# Patient Record
Sex: Male | Born: 1965 | Race: White | Hispanic: No | Marital: Single | Smoking: Current some day smoker
Health system: Southern US, Community
[De-identification: ages and names within clinical notes are randomized; demographics above are authoritative.]

## PROBLEM LIST (undated history)

## (undated) ENCOUNTER — Ambulatory Visit (INDEPENDENT_AMBULATORY_CARE_PROVIDER_SITE_OTHER): Admission: RE | Payer: Self-pay

## (undated) DIAGNOSIS — F329 Major depressive disorder, single episode, unspecified: Secondary | ICD-10-CM

## (undated) DIAGNOSIS — I2699 Other pulmonary embolism without acute cor pulmonale: Secondary | ICD-10-CM

## (undated) DIAGNOSIS — F32A Depression, unspecified: Secondary | ICD-10-CM

## (undated) DIAGNOSIS — K602 Anal fissure, unspecified: Secondary | ICD-10-CM

## (undated) DIAGNOSIS — F419 Anxiety disorder, unspecified: Secondary | ICD-10-CM

## (undated) DIAGNOSIS — E042 Nontoxic multinodular goiter: Secondary | ICD-10-CM

## (undated) DIAGNOSIS — K5190 Ulcerative colitis, unspecified, without complications: Secondary | ICD-10-CM

## (undated) DIAGNOSIS — I10 Essential (primary) hypertension: Secondary | ICD-10-CM

## (undated) DIAGNOSIS — I82409 Acute embolism and thrombosis of unspecified deep veins of unspecified lower extremity: Secondary | ICD-10-CM

## (undated) DIAGNOSIS — G4730 Sleep apnea, unspecified: Secondary | ICD-10-CM

## (undated) DIAGNOSIS — D126 Benign neoplasm of colon, unspecified: Secondary | ICD-10-CM

## (undated) DIAGNOSIS — E291 Testicular hypofunction: Secondary | ICD-10-CM

## (undated) DIAGNOSIS — K219 Gastro-esophageal reflux disease without esophagitis: Secondary | ICD-10-CM

## (undated) DIAGNOSIS — G589 Mononeuropathy, unspecified: Secondary | ICD-10-CM

## (undated) HISTORY — DX: Nontoxic multinodular goiter: E04.2

## (undated) HISTORY — DX: Benign neoplasm of colon, unspecified: D12.6

## (undated) HISTORY — DX: Testicular hypofunction: E29.1

## (undated) HISTORY — DX: Anxiety disorder, unspecified: F41.9

## (undated) HISTORY — DX: Anal fissure, unspecified: K60.2

## (undated) HISTORY — DX: Sleep apnea, unspecified: G47.30

## (undated) HISTORY — DX: Major depressive disorder, single episode, unspecified: F32.9

## (undated) HISTORY — DX: Essential (primary) hypertension: I10

## (undated) HISTORY — DX: Ulcerative colitis, unspecified, without complications: K51.90

## (undated) HISTORY — DX: Depression, unspecified: F32.A

## (undated) HISTORY — DX: Mononeuropathy, unspecified: G58.9

---

## 1992-03-04 DIAGNOSIS — K5190 Ulcerative colitis, unspecified, without complications: Secondary | ICD-10-CM | POA: Insufficient documentation

## 1998-07-07 ENCOUNTER — Emergency Department (HOSPITAL_COMMUNITY): Admission: EM | Admit: 1998-07-07 | Discharge: 1998-07-07 | Payer: Self-pay | Admitting: Emergency Medicine

## 2005-01-09 ENCOUNTER — Ambulatory Visit: Payer: Self-pay | Admitting: Internal Medicine

## 2005-01-15 ENCOUNTER — Ambulatory Visit: Payer: Self-pay | Admitting: Internal Medicine

## 2005-01-29 ENCOUNTER — Ambulatory Visit: Payer: Self-pay | Admitting: Internal Medicine

## 2005-03-26 ENCOUNTER — Ambulatory Visit: Payer: Self-pay | Admitting: Internal Medicine

## 2005-04-02 ENCOUNTER — Ambulatory Visit: Payer: Self-pay | Admitting: Internal Medicine

## 2005-05-24 ENCOUNTER — Ambulatory Visit: Payer: Self-pay | Admitting: Internal Medicine

## 2005-05-31 ENCOUNTER — Ambulatory Visit: Payer: Self-pay | Admitting: Internal Medicine

## 2005-08-28 ENCOUNTER — Ambulatory Visit: Payer: Self-pay | Admitting: Internal Medicine

## 2005-11-26 ENCOUNTER — Ambulatory Visit: Payer: Self-pay | Admitting: Internal Medicine

## 2005-11-28 ENCOUNTER — Ambulatory Visit: Payer: Self-pay | Admitting: Internal Medicine

## 2006-09-23 ENCOUNTER — Ambulatory Visit: Payer: Self-pay | Admitting: Internal Medicine

## 2006-09-23 LAB — CONVERTED CEMR LAB
AST: 22 units/L (ref 0–37)
Albumin: 4.3 g/dL (ref 3.5–5.2)
BUN: 20 mg/dL (ref 6–23)
Blood in Urine, dipstick: NEGATIVE
CO2: 29 meq/L (ref 19–32)
Chloride: 99 meq/L (ref 96–112)
Eosinophils Absolute: 0.1 10*3/uL (ref 0.0–0.6)
Eosinophils Relative: 1.5 % (ref 0.0–5.0)
GFR calc Af Amer: 139 mL/min
GFR calc non Af Amer: 115 mL/min
Glucose, Bld: 100 mg/dL — ABNORMAL HIGH (ref 70–99)
Hemoglobin: 15.4 g/dL (ref 13.0–17.0)
Ketones, urine, test strip: NEGATIVE
LDL Cholesterol: 127 mg/dL — ABNORMAL HIGH (ref 0–99)
MCHC: 34.7 g/dL (ref 30.0–36.0)
Neutrophils Relative %: 42 % — ABNORMAL LOW (ref 43.0–77.0)
Potassium: 4.2 meq/L (ref 3.5–5.1)
RBC: 5.05 M/uL (ref 4.22–5.81)
RDW: 12.8 % (ref 11.5–14.6)
TSH: 0.99 microintl units/mL (ref 0.35–5.50)
Total Bilirubin: 1.2 mg/dL (ref 0.3–1.2)
Total CHOL/HDL Ratio: 5.9
Triglycerides: 94 mg/dL (ref 0–149)
VLDL: 19 mg/dL (ref 0–40)
pH: 6.5

## 2006-10-06 ENCOUNTER — Ambulatory Visit: Payer: Self-pay | Admitting: Internal Medicine

## 2006-10-06 DIAGNOSIS — C44599 Other specified malignant neoplasm of skin of other part of trunk: Secondary | ICD-10-CM | POA: Insufficient documentation

## 2006-11-14 ENCOUNTER — Telehealth: Payer: Self-pay | Admitting: Internal Medicine

## 2007-01-09 ENCOUNTER — Ambulatory Visit: Payer: Self-pay | Admitting: Internal Medicine

## 2007-01-09 DIAGNOSIS — E785 Hyperlipidemia, unspecified: Secondary | ICD-10-CM | POA: Insufficient documentation

## 2007-01-09 LAB — CONVERTED CEMR LAB
Cholesterol: 199 mg/dL (ref 0–200)
HDL: 29.6 mg/dL — ABNORMAL LOW (ref >39.0)
LDL Cholesterol: 139 mg/dL — ABNORMAL HIGH (ref 0–99)
VLDL: 30 mg/dL (ref 0–40)

## 2007-01-15 ENCOUNTER — Ambulatory Visit: Payer: Self-pay | Admitting: Internal Medicine

## 2007-01-15 DIAGNOSIS — E291 Testicular hypofunction: Secondary | ICD-10-CM | POA: Insufficient documentation

## 2007-01-15 DIAGNOSIS — F324 Major depressive disorder, single episode, in partial remission: Secondary | ICD-10-CM

## 2007-01-15 DIAGNOSIS — F325 Major depressive disorder, single episode, in full remission: Secondary | ICD-10-CM | POA: Insufficient documentation

## 2007-06-22 ENCOUNTER — Telehealth: Payer: Self-pay | Admitting: Internal Medicine

## 2008-01-08 ENCOUNTER — Ambulatory Visit: Payer: Self-pay | Admitting: Internal Medicine

## 2008-01-08 LAB — CONVERTED CEMR LAB
Albumin: 4.2 g/dL (ref 3.5–5.2)
Alkaline Phosphatase: 58 units/L (ref 39–117)
BUN: 15 mg/dL (ref 6–23)
Basophils Relative: 0.2 % (ref 0.0–3.0)
CO2: 30 meq/L (ref 19–32)
Chloride: 103 meq/L (ref 96–112)
Eosinophils Absolute: 0.1 10*3/uL (ref 0.0–0.7)
GFR calc Af Amer: 161 mL/min
GFR calc non Af Amer: 133 mL/min
Glucose, Bld: 96 mg/dL (ref 70–99)
Glucose, Urine, Semiquant: NEGATIVE
HCT: 40.9 % (ref 39.0–52.0)
HDL: 30.9 mg/dL — ABNORMAL LOW (ref >39.0)
Hemoglobin: 14.6 g/dL (ref 13.0–17.0)
LDL Cholesterol: 135 mg/dL — ABNORMAL HIGH (ref 0–99)
Lymphocytes Relative: 47.4 % — ABNORMAL HIGH (ref 12.0–46.0)
MCHC: 35.7 g/dL (ref 30.0–36.0)
MCV: 89.2 fL (ref 78.0–100.0)
Nitrite: NEGATIVE
Platelets: 167 10*3/uL (ref 150–400)
RDW: 12.6 % (ref 11.5–14.6)
TSH: 1.05 microintl units/mL (ref 0.35–5.50)
Triglycerides: 128 mg/dL (ref 0–149)
Urobilinogen, UA: 0.2
VLDL: 26 mg/dL (ref 0–40)
WBC Urine, dipstick: NEGATIVE
WBC: 4.5 10*3/uL (ref 4.5–10.5)

## 2008-01-18 ENCOUNTER — Ambulatory Visit: Payer: Self-pay | Admitting: Internal Medicine

## 2008-01-18 LAB — CONVERTED CEMR LAB: Testosterone: 284.68 ng/dL — ABNORMAL LOW (ref 350.00–890)

## 2008-05-05 ENCOUNTER — Telehealth: Payer: Self-pay | Admitting: Internal Medicine

## 2008-05-16 ENCOUNTER — Ambulatory Visit: Payer: Self-pay | Admitting: Internal Medicine

## 2008-06-14 ENCOUNTER — Ambulatory Visit: Payer: Self-pay | Admitting: Internal Medicine

## 2008-07-07 ENCOUNTER — Ambulatory Visit: Payer: Self-pay | Admitting: Internal Medicine

## 2008-07-07 LAB — CONVERTED CEMR LAB
Albumin: 4.2 g/dL (ref 3.5–5.2)
Alkaline Phosphatase: 55 units/L (ref 39–117)
Bilirubin, Direct: 0 mg/dL (ref 0.0–0.3)
Cholesterol: 185 mg/dL (ref 0–200)
HDL: 31.5 mg/dL — ABNORMAL LOW (ref >39.00)
LDL Cholesterol: 125 mg/dL — ABNORMAL HIGH (ref 0–99)
Total Bilirubin: 1.1 mg/dL (ref 0.3–1.2)
Total CHOL/HDL Ratio: 6
VLDL: 28.2 mg/dL (ref 0.0–40.0)

## 2008-07-11 ENCOUNTER — Ambulatory Visit: Payer: Self-pay | Admitting: Internal Medicine

## 2008-07-12 ENCOUNTER — Ambulatory Visit: Payer: Self-pay | Admitting: Internal Medicine

## 2008-08-15 ENCOUNTER — Ambulatory Visit: Payer: Self-pay | Admitting: Internal Medicine

## 2008-09-12 ENCOUNTER — Ambulatory Visit: Payer: Self-pay | Admitting: Internal Medicine

## 2008-10-11 ENCOUNTER — Encounter (INDEPENDENT_AMBULATORY_CARE_PROVIDER_SITE_OTHER): Payer: Self-pay | Admitting: *Deleted

## 2008-10-12 ENCOUNTER — Ambulatory Visit: Payer: Self-pay | Admitting: Internal Medicine

## 2008-11-15 ENCOUNTER — Ambulatory Visit: Payer: Self-pay | Admitting: Internal Medicine

## 2008-12-12 ENCOUNTER — Ambulatory Visit: Payer: Self-pay | Admitting: Internal Medicine

## 2009-01-10 ENCOUNTER — Ambulatory Visit: Payer: Self-pay | Admitting: Internal Medicine

## 2009-02-27 ENCOUNTER — Ambulatory Visit: Payer: Self-pay | Admitting: Internal Medicine

## 2009-03-22 ENCOUNTER — Ambulatory Visit: Payer: Self-pay | Admitting: Internal Medicine

## 2009-03-24 ENCOUNTER — Telehealth: Payer: Self-pay | Admitting: Gastroenterology

## 2009-04-18 ENCOUNTER — Ambulatory Visit: Payer: Self-pay | Admitting: Internal Medicine

## 2009-05-03 ENCOUNTER — Encounter: Admission: RE | Admit: 2009-05-03 | Discharge: 2009-05-03 | Payer: Self-pay | Admitting: Family Medicine

## 2009-05-19 ENCOUNTER — Ambulatory Visit: Payer: Self-pay | Admitting: Internal Medicine

## 2009-06-16 ENCOUNTER — Ambulatory Visit: Payer: Self-pay | Admitting: Internal Medicine

## 2009-07-18 ENCOUNTER — Ambulatory Visit: Payer: Self-pay | Admitting: Internal Medicine

## 2009-08-15 ENCOUNTER — Ambulatory Visit: Payer: Self-pay | Admitting: Internal Medicine

## 2009-09-14 ENCOUNTER — Telehealth: Payer: Self-pay | Admitting: Internal Medicine

## 2009-09-14 ENCOUNTER — Ambulatory Visit: Payer: Self-pay | Admitting: Internal Medicine

## 2009-10-13 ENCOUNTER — Ambulatory Visit: Payer: Self-pay | Admitting: Internal Medicine

## 2009-10-13 LAB — CONVERTED CEMR LAB: Testosterone: 274.04 ng/dL — ABNORMAL LOW (ref 350–890)

## 2009-11-10 ENCOUNTER — Ambulatory Visit: Payer: Self-pay | Admitting: Internal Medicine

## 2009-11-16 ENCOUNTER — Encounter: Payer: Self-pay | Admitting: Internal Medicine

## 2009-12-08 ENCOUNTER — Ambulatory Visit: Payer: Self-pay | Admitting: Internal Medicine

## 2009-12-25 ENCOUNTER — Ambulatory Visit: Payer: Self-pay | Admitting: Family Medicine

## 2009-12-25 DIAGNOSIS — J029 Acute pharyngitis, unspecified: Secondary | ICD-10-CM | POA: Insufficient documentation

## 2009-12-27 ENCOUNTER — Ambulatory Visit: Payer: Self-pay | Admitting: Family Medicine

## 2010-01-05 ENCOUNTER — Ambulatory Visit: Payer: Self-pay | Admitting: Internal Medicine

## 2010-02-02 ENCOUNTER — Ambulatory Visit: Payer: Self-pay | Admitting: Internal Medicine

## 2010-02-21 ENCOUNTER — Ambulatory Visit: Payer: Self-pay | Admitting: Internal Medicine

## 2010-03-16 ENCOUNTER — Ambulatory Visit
Admission: RE | Admit: 2010-03-16 | Discharge: 2010-03-16 | Payer: Self-pay | Source: Home / Self Care | Attending: Internal Medicine | Admitting: Internal Medicine

## 2010-04-03 NOTE — Assessment & Plan Note (Signed)
Summary: testosterone /bmw  Nurse Visit   Allergies: 1)  ! 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

## 2010-04-03 NOTE — Assessment & Plan Note (Signed)
Summary: testosterone inj/cjr  Nurse Visit   Allergies: 1)  ! 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

## 2010-04-03 NOTE — Assessment & Plan Note (Signed)
Summary: testosterone inj/njr  Nurse Visit   Allergies: 1)  ! 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

## 2010-04-03 NOTE — Assessment & Plan Note (Signed)
Summary: testosterone inj//ccm  Nurse Visit   Allergies: 1)  ! 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

## 2010-04-03 NOTE — Progress Notes (Signed)
Summary: new rx  Phone Note Call from Patient Call back at Home Phone [phone]   Caller: Patient Call For: Ricard Dillon MD Summary of Call: pt needs new rx for zoloft [facility] [address] [phone] Initial call taken by: Glo Herring,  September 14, 2009 12:21 PM  Follow-up for Phone Call        was on last year an d wants to try again Follow-up by: Allyne Gee, LPN,  September 14, 3473 83:07 PM  Additional Follow-up for Phone Call Additional follow up Details #1::        per dr Arnoldo Morale- ok to call in zoloft 50 once daily  Additional Follow-up by: Allyne Gee, LPN,  September 14, 4598 29:84 PM    New/Updated Medications: ZOLOFT 50 MG TABS (SERTRALINE HCL) one by mouth daily Prescriptions: ZOLOFT 50 MG TABS (SERTRALINE HCL) one by mouth daily  #30 x 5   Entered by:   Deanna Artis CMA   Authorized by:   Ricard Dillon MD   Signed by:   Deanna Artis CMA on 09/14/2009   Method used:   [facility] to        [id number]       [address]       [place], [place]  [address]       Ph: [phone]       Fax: [phone]   RxID:   [id number]  Pt. notified.

## 2010-04-03 NOTE — Assessment & Plan Note (Signed)
Summary: testosterone inj//ccm  Nurse Visit   Allergies: 1)  ! Flagyl  Medication Administration  Injection # 1:    Medication: Testosterone Cypionat 292m ing    Diagnosis: HYPOGONADISM, MALE (ICD-257.2)    Route: IM    Site: RUOQ gluteus    Exp Date: 07/07/2010    Lot #: [id number]   Mfr: sandoz    Patient tolerated injection without complications    Given by: BAllyne Gee LPN (May 17, 25809198:33PM)  Orders Added: 1)  Admin of patients own med IM/SQ [[phone]

## 2010-04-03 NOTE — Assessment & Plan Note (Signed)
Summary: testosterone injection/bmw  Nurse Visit   Allergies: 1)  ! 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

## 2010-04-03 NOTE — Assessment & Plan Note (Signed)
Summary: testosterone inj/cjr  Nurse Visit   Vital Signs:  Patient profile:   45 year old male Temp:     97.9 degrees F  Vitals Entered By: Cay Schillings LPN (November 10, 4097 4:02 PM)  Allergies: 1)  ! 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

## 2010-04-03 NOTE — Assessment & Plan Note (Signed)
Summary: testosterone inj ok per bonnie 4pm/njr  Nurse Visit   Allergies: 1)  ! 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

## 2010-04-03 NOTE — Assessment & Plan Note (Signed)
Summary: fu on testosterone/njr   Vital Signs:  Patient profile:   45 year old male Height:      69 inches Weight:      200 pounds BMI:     29.64 Temp:     98.2 degrees F oral Pulse rate:   76 / minute Resp:     14 per minute BP sitting:   130 / 80  (left arm)  Vitals Entered By: Allyne Gee, LPN (October 14, 6710 4:08 PM) CC: roa- testsoterone check Is Patient Diabetic? No   CC:  roa- testsoterone check.  History of Present Illness: has been on the one shoot of 1 cc  monthly has felt the need to up the shots his feeling is thagt the shot does not kick in for 2 daty then the feeling of wellbeing kicks in for 10 days, then a week of "so-so" then a decline 2-4 days prior to the shot increased fatigue some short term memory loss  Preventive Screening-Counseling & Management  Alcohol-Tobacco     Smoking Status: never  Problems Prior to Update: 1)  Depression  (ICD-311) 2)  Hypogonadism, Male  (ICD-257.2) 3)  Hyperlipidemia  (ICD-272.4) 4)  Preventive Health Care  (ICD-V70.0) 5)  Neop, Malignant, Skin, 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 Two By Mouth With Food 2)  Testosterone Cypionate 200 Mg/ml Oil (Testosterone Cypionate) .Marland Kitchen.. 1cc Im Monthly 3)  Zoloft 50 Mg Tabs (Sertraline Hcl) .... One By Mouth Daily  Current Medications (verified): 1)  Niacin Flush Free 500 Mg  Caps (Inositol Niacinate) .... Two By Mouth With Food 2)  Testosterone Cypionate 200 Mg/ml Oil (Testosterone  Cypionate) .Marland Kitchen.. 1cc Every Three Weeks 3)  Zoloft 50 Mg Tabs (Sertraline Hcl) .... One By Mouth Daily  Allergies (verified): 1)  ! Flagyl  Past History:  Family History: Last updated: 10/06/2006 Father COPD Family History Lung cancer  Social History: Last updated: 10/06/2006 Alcohol use-no Drug use-no Divorced Never Smoked  Risk Factors: Smoking Status: never (10/13/2009)  Past medical, surgical, family and social histories (including risk factors) reviewed, and no changes noted (except as noted below).  Past Medical History: Reviewed history from 01/15/2007 and no changes required. COLITIS, ULCERATIVE NOS (ICD-556.9) FAMILY HISTORY DIABETES 1ST DEGREE RELATIVE (ICD-V18.0) Depression  Past Surgical History: Reviewed history from 10/06/2006 and no changes required. tatoo Denies surgical history  Family History: Reviewed history from 10/06/2006 and no changes required. Father COPD Family History Lung cancer  Social History: Reviewed history from 10/06/2006 and no changes required. Alcohol use-no Drug use-no Divorced Never Smoked  Review of Systems  The patient denies anorexia, fever, weight loss, weight gain, vision loss, decreased hearing, hoarseness, chest pain, syncope, dyspnea on exertion, peripheral edema, prolonged cough, headaches, hemoptysis, abdominal pain, melena, hematochezia, severe indigestion/heartburn, hematuria, incontinence, genital sores, muscle weakness, suspicious skin lesions, transient blindness, difficulty walking, depression, unusual weight change, abnormal bleeding, enlarged lymph nodes, angioedema, breast masses, and testicular masses.    Physical Exam  General:  Well-developed,well-nourished,in no acute distress; alert,appropriate  and cooperative throughout examination Head:  focal alopecia.   Eyes:  No corneal or conjunctival inflammation noted. EOMI. Perrla. Funduscopic exam benign, without hemorrhages, exudates or papilledema. Vision  grossly normal. Nose:  External nasal examination shows no deformity or inflammation. Nasal mucosa are pink and moist without lesions or exudates. Mouth:  Oral mucosa and oropharynx without lesions or exudates.  Teeth in good repair. Neck:  No deformities, masses, or tenderness noted. Lungs:  Normal respiratory effort, chest expands symmetrically. Lungs are clear to auscultation, no crackles or wheezes. Heart:  Normal rate and regular rhythm. S1 and S2 normal without gallop, murmur, click, rub or other extra sounds. Abdomen:  Bowel sounds positive,abdomen soft and non-tender without masses, organomegaly or hernias noted. Rectal:  no external abnormalities and internal hemorrhoid(s).     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 Two by mouth with food 2)  Testosterone Cypionate 200 Mg/ml Oil (Testosterone cypionate) .Marland Kitchen.. 1cc every three weeks 3)  Zoloft 50 Mg Tabs (Sertraline hcl) .... One by mouth daily  Patient Instructions: 1)  Please schedule a follow-up appointment in 2 months. 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

## 2010-04-03 NOTE — Medication Information (Signed)
Summary: Androderm Approved  Androderm Approved   Imported By: Laural Benes 11/21/2009 09:27:14  _____________________________________________________________________  External Attachment:    Type:   Image     Comment:   External Document

## 2010-04-03 NOTE — Assessment & Plan Note (Signed)
Summary: swollen throat/ccm   Vital Signs:  Patient profile:   45 year old male Weight:      200 pounds Temp:     97.8 degrees F oral BP sitting:   122 / 82  (left arm) Cuff size:   regular  Vitals Entered By: Nira Conn LPN (December 26, 4942 10:25 AM)  History of Present Illness: Sore throat for 3 days.  Swollen uvula noted yesterday. Some sinus drainage.  No fever.  Mild body aches. Took some Tylenol which helped slightly.  No cough. No ill exposures. No aggravating factors.  Allergies: 1)  ! Flagyl  Past History:  Past Medical History: Last updated: 01/15/2007 COLITIS, ULCERATIVE NOS (ICD-556.9) FAMILY HISTORY DIABETES 1ST DEGREE RELATIVE (ICD-V18.0) Depression PMH reviewed for relevance  Review of Systems  The patient denies anorexia, fever, hoarseness, prolonged cough, and headaches.    Physical Exam  General:  Well-developed,well-nourished,in no acute distress; alert,appropriate and cooperative throughout examination Ears:  External ear exam shows no significant lesions or deformities.  Otoscopic examination reveals clear canals, tympanic membranes are intact bilaterally without bulging, retraction, inflammation or discharge. Hearing is grossly normal bilaterally. Mouth:  moderate post pharynx erythema.  Uvula is swollen with some superficial aphthous ulcers.  No exudate.  No obstruction. Neck:  No deformities, masses, or tenderness noted. Lungs:  Normal respiratory effort, chest expands symmetrically. Lungs are clear to auscultation, no crackles or wheezes. Heart:  Normal rate and regular rhythm. S1 and S2 normal without gallop, murmur, click, rub or other extra sounds. Skin:  Intact without suspicious lesions or rashes Cervical Nodes:  some tender AC nodes bilaterally.   Impression & Recommendations:  Problem # 1:  PHARYNGITIS (ICD-462) rapid strep is neg.  Suspect viral with appearance of aphthous type ulcers. given degree of uvula swelling with given  Depomedrol 80 mg IM for symptomatic treatment. Also rec ice chips and Advil or Alleve.  Orders: Depo- Medrol 54m (J1040) Admin of Therapeutic Inj  intramuscular or subcutaneous ((96759 Rapid Strep ((16384  Complete Medication List: 1)  Niacin Flush Free 500 Mg Caps (Inositol niacinate) .... Two by mouth with food as needed 2)  Testosterone Cypionate 200 Mg/ml Oil (Testosterone cypionate) ..Marland Kitchen. 1cc every three weeks 3)  Zoloft 50 Mg Tabs (Sertraline hcl) .... One by mouth daily  Patient Instructions: 1)  Ice chips  and Advil or Alleve for symptomatifc relief. 2)  Be in touch if any progressive swelling or difficulty with swallowing or breathing.   Medication Administration  Injection # 1:    Medication: Depo- Medrol 848m   Diagnosis: PHARYNGITIS (ICD-462)    Route: IM    Site: LUOQ gluteus    Exp Date: 06/02/2012    Lot #: [id number]    Mfr: Pharmacia    Patient tolerated injection without complications    Given by: NaNira ConnPN (October 24, 2066590:52 AM)  Orders Added: 1)  Est. Patient Level III [9[93570])  Depo- Medrol 8080mJ1040] 3)  Admin of Therapeutic Inj  intramuscular or subcutaneous [96372] 4)  Rapid Strep [87[17793]

## 2010-04-03 NOTE — Assessment & Plan Note (Signed)
Summary: F/U ON SWOLLEN TONSILS, PAIN FROM ULCERS NEAR THROAT // RS   Vital Signs:  Patient profile:   45 year old male Temp:     98.4 degrees F oral BP sitting:   130 / 88  (left arm) Cuff size:   regular  Vitals Entered By: Nira Conn LPN (December 28, 1362 2:28 PM)  History of Present Illness: Patient seen for followup tonsillitis and swollen uvula. Recent rapid strep negative. Has continued headaches and body aches. He had significant swelling of the uvula and was given Depo-Medrol which has reduced swelling somewhat. Has pain with swallowing but no obstructive symptoms. No fever.  Allergies: 1)  ! Flagyl  Physical Exam  General:  Well-developed,well-nourished,in no acute distress; alert,appropriate and cooperative throughout examination Mouth:  patient still has some swelling of the uvula but overall decreased. He has what appear to be some aphthous changes uvula.  no real exudate.  No evid for peritonsilar abscess. Neck:  tender anterior cervical nodes bilaterally Lungs:  Normal respiratory effort, chest expands symmetrically. Lungs are clear to auscultation, no crackles or wheezes. Skin:  no rashes.     Impression & Recommendations:  Problem # 1:  PHARYNGITIS (ICD-462)  go and cover with amoxicillin given lack of other typical viral symptoms and  progressive erythema and continued symptoms  His updated medication list for this problem includes:    Amoxicillin 875 Mg Tabs (Amoxicillin) ..... One by mouth two times a day for 10 days  Complete Medication List: 1)  Niacin Flush Free 500 Mg Caps (Inositol niacinate) .... Two by mouth with food as needed 2)  Testosterone Cypionate 200 Mg/ml Oil (Testosterone cypionate) .Marland Kitchen.. 1cc every three weeks 3)  Zoloft 50 Mg Tabs (Sertraline hcl) .... One by mouth daily 4)  Amoxicillin 875 Mg Tabs (Amoxicillin) .... One by mouth two times a day for 10 days  Patient Instructions: 1)  Consider salt water gargles several times  daily Prescriptions: AMOXICILLIN 875 MG TABS (AMOXICILLIN) one by mouth two times a day for 10 days  #20 x 0   Entered and Authorized by:   Carolann Littler MD   Signed by:   Carolann Littler MD on 12/27/2009   Method used:   Electronically to        [facility] [phone]* (retail)       [address]       [place], [place]  [address]       Ph: [phone]       Fax: [phone]   RxID:   [id number]    Orders Added: 1)  Est. Patient Level III [72761]

## 2010-04-03 NOTE — Assessment & Plan Note (Signed)
Summary: inj with bonnye//ccm  Nurse Visit   Allergies: 1)  ! 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

## 2010-04-03 NOTE — Progress Notes (Signed)
Summary: Schedule Colonoscopy  Phone Note Outgoing Call   Call placed by: Awilda Bill CMA Deborra Medina),  March 24, 2009 3:39 PM Call placed to: Patient Summary of Call: I have left a message for patient to call back. 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 Initial call taken by: Awilda Bill CMA Deborra Medina),  March 24, 2009 3:48 PM  Follow-up for Phone Call        Left message on voicemail for patient to call back. Awilda Bill CMA Deborra Medina)  March 28, 2009 10:44 AM   Left message for patient to call back. Awilda Bill CMA Deborra Medina)  March 31, 2009 10:29 AM   I have not recieved a call back from the patient. We will send a letter. Awilda Bill CMA Deborra Medina)  April 05, 2009 1:03 PM

## 2010-04-03 NOTE — Assessment & Plan Note (Signed)
Summary: TESTOSTERONE INJ // RS  Nurse Visit   Allergies: 1)  ! 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

## 2010-04-03 NOTE — Assessment & Plan Note (Signed)
Summary: testosterone inj/cjr  Nurse Visit   Allergies: 1)  ! 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

## 2010-04-05 NOTE — Assessment & Plan Note (Signed)
Summary: testosterone inj/pt coming in at 4pm/cjr  Nurse Visit   Allergies: 1)  ! Flagyl

## 2010-04-05 NOTE — Assessment & Plan Note (Signed)
Summary: TESTOSTERONE INJ @ 4PM//CCM  Nurse Visit   Allergies: 1)  ! 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

## 2010-07-09 ENCOUNTER — Telehealth: Payer: Self-pay | Admitting: *Deleted

## 2010-07-09 MED ORDER — BUPROPION HCL ER (XL) 150 MG PO TB24: 150.0000 mg | ORAL_TABLET | ORAL | Status: DC

## 2010-07-09 NOTE — Telephone Encounter (Addendum)
Pt calls stating he thinks he is having some real negative reactions from Zoloft.  He is completley apathetic, feels sad, is making mistakes at work, has some anger issues, cannot think straight, feels he is just standing and watching, and does not participate in anything.  No passion for anything that he had before.  NOT SUICIDAL, but feels he needs to talk to Dr. Arnoldo Morale.  Has heard that Celexa and Wellbutrin are good meds.

## 2010-07-09 NOTE — Telephone Encounter (Signed)
Per dr Arnoldo Morale- can try wellbutrin xr 150 every day and ov with dr Arnoldo Morale in 3 weeks

## 2010-07-09 NOTE — Telephone Encounter (Signed)
Pt.notified

## 2010-07-13 ENCOUNTER — Telehealth: Payer: Self-pay | Admitting: *Deleted

## 2010-07-13 NOTE — Telephone Encounter (Signed)
Pt has taken the Wellbutrin x 4 days, and feels dopey and dizzy. Wonders is he should continue, or could he change to Celexa?

## 2010-07-13 NOTE — Telephone Encounter (Signed)
Pt was given Dr. Adline Mango recommendations.

## 2010-07-13 NOTE — Telephone Encounter (Signed)
Per dr Genia Hotter it time

## 2010-07-13 NOTE — Telephone Encounter (Signed)
LMTCB

## 2010-07-24 ENCOUNTER — Encounter: Payer: Self-pay | Admitting: Internal Medicine

## 2010-07-25 ENCOUNTER — Telehealth: Payer: Self-pay | Admitting: *Deleted

## 2010-07-25 MED ORDER — CITALOPRAM HYDROBROMIDE 20 MG PO TABS: 20.0000 mg | ORAL_TABLET | Freq: Every day | ORAL | Status: DC

## 2010-07-25 NOTE — Telephone Encounter (Signed)
Per dr Arnoldo Morale- may have celexa 20 1 qd #30 with 5 refills

## 2010-07-25 NOTE — Telephone Encounter (Signed)
Notified pt. 

## 2010-07-25 NOTE — Telephone Encounter (Signed)
Pt wants to go off the Wellbutrin.  It is making him irritable.  Wants Celexa.

## 2010-08-01 ENCOUNTER — Encounter: Payer: Self-pay | Admitting: Internal Medicine

## 2010-08-01 ENCOUNTER — Ambulatory Visit (INDEPENDENT_AMBULATORY_CARE_PROVIDER_SITE_OTHER): Payer: Managed Care, Other (non HMO) | Admitting: Internal Medicine

## 2010-08-01 VITALS — BP 110/76 | HR 72 | Temp 98.2°F | Resp 16 | Ht 68.0 in | Wt 207.0 lb

## 2010-08-01 DIAGNOSIS — R5381 Other malaise: Secondary | ICD-10-CM

## 2010-08-01 DIAGNOSIS — R5383 Other fatigue: Secondary | ICD-10-CM

## 2010-08-01 DIAGNOSIS — E291 Testicular hypofunction: Secondary | ICD-10-CM

## 2010-08-01 DIAGNOSIS — F329 Major depressive disorder, single episode, unspecified: Secondary | ICD-10-CM

## 2010-08-01 DIAGNOSIS — T887XXA Unspecified adverse effect of drug or medicament, initial encounter: Secondary | ICD-10-CM

## 2010-08-01 NOTE — Progress Notes (Signed)
  Subjective:    Patient ID: [name], male    DOB: 1965-12-02, 45 y.o.   MRN: [id number]  HPI Dizziness on the Wellbutrin  And short tempered Better on the celexa On for two weeks, better temperament, less side effects Testosterone injections discussed with review of side effects side effects of medication History of hypogonadism history of recurrent depression     Review of Systems  Constitutional: Negative for fever and fatigue.  HENT: Negative for hearing loss, congestion, neck pain and postnasal drip.   Eyes: Negative for discharge, redness and visual disturbance.  Respiratory: Negative for cough, shortness of breath and wheezing.   Cardiovascular: Negative for leg swelling.  Gastrointestinal: Negative for abdominal pain, constipation and abdominal distention.  Genitourinary: Negative for urgency and frequency.  Musculoskeletal: Negative for joint swelling and arthralgias.  Skin: Negative for color change and rash.  Neurological: Positive for weakness. Negative for light-headedness.  Hematological: Negative for adenopathy.  Psychiatric/Behavioral: Positive for dysphoric mood and decreased concentration. Negative for behavioral problems.   Past Medical History  Diagnosis Date  . Ulcerative colitis, unspecified   . Depression   . Hypogonadism male    Past Surgical History  Procedure Date  . Tatoo     reports that he has never smoked. He does not have any smokeless tobacco history on file. He reports that he does not drink alcohol or use illicit drugs. family history includes COPD in his father and Lung cancer in an unspecified family member. Allergies  Allergen Reactions  . Metronidazole     REACTION: Fatigue       Objective:   Physical Exam  Constitutional: He appears well-developed and well-nourished.       Multiple tatoos  HENT:  Head: Normocephalic and atraumatic.  Eyes: Conjunctivae are normal. Pupils are equal, round, and reactive to light.  Neck:  Normal range of motion. Neck supple.  Cardiovascular: Normal rate and regular rhythm.   Pulmonary/Chest: Effort normal and breath sounds normal.  Abdominal: Soft. Bowel sounds are normal.          Assessment & Plan:  Sweating with the SSRI Better mood discussed in detail the Celexa use what to expect for the next 2 weeks and what to expect for chronic therapy and discuss length of therapy with goal of at least 6 months of continuous therapy Discussion of the length of therapy . I have spent more than 30 minutes examining this patient face-to-face of which over half was spent in counseling Androgen therapy discussed.

## 2010-08-02 LAB — TESTOSTERONE: Testosterone: 622.61 ng/dL (ref 350.00–890.00)

## 2010-10-01 ENCOUNTER — Ambulatory Visit (INDEPENDENT_AMBULATORY_CARE_PROVIDER_SITE_OTHER): Payer: Managed Care, Other (non HMO) | Admitting: Internal Medicine

## 2010-10-01 ENCOUNTER — Other Ambulatory Visit: Payer: Self-pay | Admitting: *Deleted

## 2010-10-01 ENCOUNTER — Encounter: Payer: Self-pay | Admitting: Internal Medicine

## 2010-10-01 DIAGNOSIS — E785 Hyperlipidemia, unspecified: Secondary | ICD-10-CM

## 2010-10-01 DIAGNOSIS — R5381 Other malaise: Secondary | ICD-10-CM

## 2010-10-01 DIAGNOSIS — K5190 Ulcerative colitis, unspecified, without complications: Secondary | ICD-10-CM

## 2010-10-01 DIAGNOSIS — F329 Major depressive disorder, single episode, unspecified: Secondary | ICD-10-CM

## 2010-10-01 DIAGNOSIS — E291 Testicular hypofunction: Secondary | ICD-10-CM

## 2010-10-01 DIAGNOSIS — R5383 Other fatigue: Secondary | ICD-10-CM

## 2010-10-01 MED ORDER — TESTOSTERONE CYPIONATE 200 MG/ML IM SOLN: 200.0000 mg | INTRAMUSCULAR | Status: DC

## 2010-10-01 NOTE — Progress Notes (Signed)
  Subjective:    Patient ID: [name], male    DOB: 06-13-1965, 45 y.o.   MRN: [id number]  HPI    Review of Systems     Objective:   Physical Exam        Assessment & Plan:   No problem-specific assessment & plan notes found for this encounter.

## 2010-11-08 ENCOUNTER — Telehealth: Payer: Self-pay | Admitting: Internal Medicine

## 2010-11-08 NOTE — Telephone Encounter (Signed)
testosterone cypionate (DEPOTESTOTERONE CYPIONATE) 200 MG/ML injection [[id number]]  [facility] has printed out a label on pt.'s box that states he can only get an injection once a month instead of every 21 days like in states in Dr. Arnoldo Morale instructions. He would like someone to call the pharmacy so they can correct the prescription to every 21 days.

## 2010-11-12 NOTE — Telephone Encounter (Signed)
Should read a shot every 21 days proper was a mistake in our ordering of his medications please change the sitting to injection every 21 days

## 2010-11-12 NOTE — Telephone Encounter (Signed)
rx called in, pt aware

## 2010-11-27 ENCOUNTER — Encounter (INDEPENDENT_AMBULATORY_CARE_PROVIDER_SITE_OTHER): Payer: Self-pay | Admitting: Family

## 2010-11-27 ENCOUNTER — Ambulatory Visit (INDEPENDENT_AMBULATORY_CARE_PROVIDER_SITE_OTHER): Payer: BC Managed Care – PPO | Admitting: Family

## 2010-11-27 DIAGNOSIS — S335XXA Sprain of ligaments of lumbar spine, initial encounter: Secondary | ICD-10-CM

## 2010-11-27 DIAGNOSIS — M545 Low back pain, unspecified: Secondary | ICD-10-CM

## 2010-11-27 DIAGNOSIS — IMO0002 Reserved for concepts with insufficient information to code with codable children: Secondary | ICD-10-CM

## 2010-11-27 DIAGNOSIS — S161XXA Strain of muscle, fascia and tendon at neck level, initial encounter: Secondary | ICD-10-CM

## 2010-11-27 DIAGNOSIS — M542 Cervicalgia: Secondary | ICD-10-CM

## 2010-11-27 DIAGNOSIS — S139XXA Sprain of joints and ligaments of unspecified parts of neck, initial encounter: Secondary | ICD-10-CM

## 2010-11-27 MED ORDER — OXYCODONE-ACETAMINOPHEN 5-325 MG PO TABS
ORAL_TABLET | ORAL | Status: DC
Start: 2010-11-27 — End: 2017-06-19

## 2010-11-27 MED ORDER — CYCLOBENZAPRINE HCL 10 MG PO TABS
10.00 mg | ORAL_TABLET | Freq: Three times a day (TID) | ORAL | Status: AC | PRN
Start: 2010-11-27 — End: 2010-12-07

## 2010-11-27 MED ORDER — OXYCODONE-ACETAMINOPHEN 5-325 MG PO TABS
ORAL_TABLET | ORAL | Status: DC
Start: 2010-11-27 — End: 2010-11-27

## 2010-11-27 MED ORDER — IBUPROFEN 800 MG PO TABS
800.00 mg | ORAL_TABLET | Freq: Three times a day (TID) | ORAL | Status: AC | PRN
Start: 2010-11-27 — End: 2010-12-07

## 2010-11-27 NOTE — Patient Instructions (Signed)
Follow up with PCP within the next week.  Return sooner for any new or concerning symptoms.  Flexeril  Percocet  Ibuprofen  Rest for the next 1-2 days    MEDICATION: IBUPROFEN (Adult)  Ibuprofen (brand name: Motrin) is an anti-inflammatory drug. It is also available over the counter in a lower strength as Advil, Motrin IB, and other brand names. It is very useful for pain, fever, and inflammation.  DIRECTIONS FOR USE:  You may take this medicine with food or milk to reduce stomach upset.  WHAT TO WATCH FOR:  POSSIBLE SIDE EFFECTS: Nausea, upper abdominal pain, dizziness (Contact your doctor if these symptoms persist or become severe). Bleeding from the stomach, which may appear as blood in vomit or stool (red or black color); rapid weight gain, leg swelling or easy bruising, change in the amount of urine, confusion/mood changes, very stiff neck, yellowing of the eyes/skin (Contact your doctor or return to this facility promptly).  ALLERGIC REACTION: Rash, itching, swelling, trouble breathing or swallowing (Contact your doctor or return to this facility promptly).  MEDICAL CONDITIONS: Before starting this medicine, be sure your doctor knows if you have any of the following conditions:   Stomach ulcer (active or in the past), history of vomiting blood or bloody stool   Chronic alcoholism, or if you consume more than 3 alcoholic drinks daily   Allergic reaction to aspirin or other anti-inflammatory medicines   Asthma, nasal polyps, or angioedema; pregnancy or breastfeeding   Liver or kidney disease; bleeding disorder, diabetes  DRUG INTERACTION: Before starting this medicine, be sure your doctor knows if you are taking any of the following drugs:   Blood thinners [Coumadin (warfarin), low molecular weight heparins, heparin], water pills [Lasix (furosemide), hydrochlorothiazide, Dyazide], blood pressure medicine, diabetes pills, corticosteroids [methylprednisolone, prednisone], aspirin or other anti-inflammatory  drugs, Lanoxin (digoxin), methotrexate, cyclosporine, cidofovir, antiplatelet drugs such as Pletal (cilostazol) or Plavix (clopidogrel), desmopressin, drugs for osteoporosis [Fosamax (alendronate)], lithium, pemetrexed, probenecid, bile acid binding resins, drotrecogin, SSRI antidepressants (fluoxetine, sertraline)  WARNINGS:   Do not take with prednisone, other anti-inflammatory drugs, or alcohol since this increases the risk of getting a bleeding ulcer.  This drug may cause dizziness. Do not drive, ride a bicycle, or operate dangerous equipment while taking this medicine until you know how it will affect you.  [NOTE: This information topic may not include all directions, precautions, medical conditions, drug/food interactions, and warnings for this drug. Check with your doctor, nurse, or pharmacist for any questions that you may have.]   2000-2011 [employer], [address] [place], [place] [address]. All rights reserved. This information is not intended as a substitute for professional medical care. Always follow your healthcare professional's instructions.  MEDICATION: CYCLOBENZAPRINE   Cyclobenzaprine (brand: Flexeril) is a sedative and muscle relaxant medication. It is intended to be used along with rest, heat or ice packs, and other measures to relieve acute muscle spasm.  DIRECTIONS FOR USE:  Cyclobenzaprine may be taken on an empty stomach or with food. Take the medicine at regular intervals. If the label says "every 8 hours", this means 3 times per day. Doses don't have to be exactly 8 hours apart, but you should take 3 doses a day. This medicine should not be taken daily for more than 3 weeks without consulting your doctor.  WHAT TO WATCH FOR:  POSSIBLE SIDE EFFECTS: Drowsiness, dizziness (Take it less often and contact your doctor if symptoms persist). Dry mouth (Chew gum or suck on hard candy).  Constipation (Contact your doctor). Difficulty passing urine; seizure (Stop the medicine and contact  your doctor).  MEDICAL CONDITIONS: Before starting this medicine, be sure your doctor knows if you have any of the following conditions:   Pregnancy or breastfeeding, sleep apnea   Glaucoma, prostate enlargement, seizure disorder, asthma, or lung disease  DRUG INTERACTIONS: Before starting this medicine, be sure your doctor knows if you are taking any of the following drugs:   MAO inhibitors within the last 14 days [Nardil (phenelzine), Parnate (tranylcypromine)];   Tricyclic antidepressants [amitriptyline (Elavil), doxepin (Sinequan), imipramine (Tofranil), and others],   Phenothiazines: Thorazine, Haldol, Mellaril, Compazine, and others   Antihistamines Benadryl, Vistaril, Atarax and others; Bentyl, Donnatal, Librax, Pro-Banthine,   Cystospaz, Cogentin, Artane, Combivent, or Atrovent inhalers; anti-Parkinson drugs  WARNINGS:   Do not drive, ride a bicycle, or operate dangerous equipment while taking this medicine until you know how it will affect you.   May cause excess drowsiness when taken with alcohol, muscle relaxant, sedative, or pain medicine. Use with caution.   May cause thickened mucus and worsening of asthma, COPD, or emphysema. Use with caution.  [NOTE: This information topic may not include all directions, precautions, medical conditions, drug/food interactions and warnings for this drug. Check with your doctor, nurse, or pharmacist for any questions that you may have.]   2000-2011 [employer], [address] [place], [place] [address]. All rights reserved. This information is not intended as a substitute for professional medical care. Always follow your healthcare professional's instructions.  MEDICATION: OXYCODONE AND ACETAMINOPHEN  Oxycodone and acetaminophen (brand names: Tylox, Endocet, Roxicet, Endocet, Percocet, and others) is a narcotic pain medicine. Be sure to take it only as directed.  DIRECTIONS FOR USE:  If this medicine upsets your stomach, take it with food. Pain medicine  should be taken only if needed at the times prescribed. If you are not having pain, do not take the medicine unless you are advised to do so by your doctor. Limit your use of acetaminophen-containing products while taking this medicine. Too much acetaminophen may cause liver damage.  WHAT TO WATCH FOR  POSSIBLE SIDE EFFECTS: Dizziness, drowsiness (Rise slowly from a seated or lying position). Constipation (Drink lots of liquids, eat a high-fiber diet, use small doses of a mild laxative like milk of magnesia as needed). Nausea, vomiting (Take the medicine with food, eat small meals, lie down. If persistent or severe, contact your doctor). Yellowing of eyes/skin, dark urine, unusual stomach pain (Contact your doctor). Difficulty passing urine, fainting, trouble breathing, difficulty waking up, chest pain, fast/irregular heartbeats, seizures (Stop the medicine and contact your doctor or return to this facility promptly).  ALLERGIC REACTION: Rash, itching, swelling, trouble breathing or swallowing (Contact your doctor or return to this facility promptly).  MEDICAL CONDITIONS: Before starting this medicine, be sure your doctor knows if you have any of the following conditions:   Prostate enlargement, liver disease   Breathing problems, kidney disease   Stomach/intestinal problems   History of head injury or seizures   Alcohol or drug dependency   Pregnancy or breastfeeding  DRUG INTERACTIONS: Before starting this medicine, be sure your doctor knows if you are taking any of the following drugs: narcotic blockers (naltrexone), alvimopan, conivaptan, dasatinib/imatinib, macrolide antibiotics, isonazid, itraconazole/ketoconazole, SSRI antidepressants (fluoxetine, sertaline, and others), protease inhibitors, serotonin agonists, sibutramine, other products containing acetaminophen, bile acid binding resins, voriconazole   Barbiturates, Tegretol (carbamazepine), Dilantin (phenytoin), rifampin   Coumadin  (warfarin)   Other pain medicines  WARNINGS:  Do not drive, ride a bicycle, or operate dangerous equipment while taking this medicine until you know how it will affect you.   This drug may cause increased side effects when taken with alcohol, muscle relaxants, sedatives, tricyclic antidepressants, MAO-inhibitors, or another pain medicine. Alcohol use may also increase the risk of liver damage when taken with medicines that contain acetaminophen.   Prolonged use of this medicine can be habit forming and may lead to addiction.  [NOTE: This information topic may not include all directions, precautions, medical conditions, drug/food interactions, and warnings for this drug. Check with your doctor, nurse, or pharmacist for any questions that you may have.]   2000-2011 [employer], [address] [place], [place] [address]. All rights reserved. This information is not intended as a substitute for professional medical care. Always follow your healthcare professional's instructions.  BACK SPRAIN or STRAIN    You have injured the muscles (strain) or ligaments (sprain) around the spine. This may occur after a sudden forceful twisting or bending force (such as in a car accident), after a simple awkward movement, or after lifting something heavy with poor body positioning. In either case, muscle spasm is often present and adds to the pain.  A back sprain or muscle strain usually gets better in 1-2 weeks. Unless you had a forceful physical injury (for example, a car accident or fall), X-rays are usually not ordered for the initial evaluation of a back sprain or strain. If pain continues and dose not respond to medical treatment, x-rays and other tests may be performed at a later time.  HOME CARE:  1. You may need to stay in bed the first few days. But, as soon as possible, begin sitting or walking to avoid problems with prolonged bed rest (muscle weakness, worsening back stiffness and pain, blood clots in the  legs).  2. When in bed, try to find a position of comfort. A firm mattress is best. Try lying flat on your back with pillows under your knees. You can also try lying on your side with your knees bent up towards your chest and a pillow between your knees.  3. Avoid prolonged sitting. This puts more stress on the lower back than standing or walking.  4. During the first two days after injury, apply an ICE PACK to the painful area for 20 minutes every 2-4 hours. This will reduce swelling and pain. HEAT (hot shower, hot bath or heating pad) works well for muscle spasm. You can start with ice, then switch to heat after two days. Some patients feel best alternating ice and heat treatments. Use the one method that feels the best to you.  5. You may use acetaminophen (Tylenol) or ibuprofen (Motrin, Advil) to control pain, unless another pain medicine was prescribed. [NOTE: If you have chronic liver or kidney disease or ever had a stomach ulcer or GI bleeding, talk with your doctor before using these medicines.]  6. Be aware of safe lifting methods and do not lift anything over 15 pounds until all the pain is gone.  FOLLOW UP with your doctor or this facility if your symptoms do not start to improve after one week. Physical therapy or further tests may be needed.  [NOTE: If X-rays were taken, they will be reviewed by a radiologist. You will be notified of any new findings that may affect your care.]  GET PROMPT MEDICAL ATTENTION if any of the following occur:   Pain becomes worse or spreads to your arms  or legs   Weakness or numbness in one or both arms or legs   Loss of bowel or bladder control   Numbness in the groin or genital area   2000-2011 [employer], [address], [place], [place] [address]. All rights reserved. This information is not intended as a substitute for professional medical care. Always follow your healthcare professional's instructions.  NECK SPRAIN or STRAIN  A sudden force that causes turning  or bending of the neck (such as in a car accident) can stretch or tear muscles (strain) and ligaments (sprain) and cause neck pain. Sometimes neck pain occurs after a simple awkward movement. In either case, muscle spasm is commonly present and contributes to the pain.    Unless you had a forceful physical injury (for example, a car accident or fall), X-rays are usually not ordered for the initial evaluation of neck pain. If pain continues and dose not respond to medical treatment, x-rays and other tests may be performed at a later time.  HOME CARE:  1) You may feel more soreness and spasm the first few days after the injury. Reduce your activity level until symptoms begin to improve.  2) When lying down, use a comfortable pillow that supports the head and keeps the spine in a neutral position. The position of the head should not be tilted forward or backward.  3) Use ice packs (ice in a plastic bag, wrapped in a towel) to treat acute pain. Apply for 20 minutes every 2-4 hours during the first two days. Then, begin local heat (hot shower, hot bath or heating pad) and massage to reduce muscle spasm. Some patients feel best alternating hot and cold treatments, or just staying with one method only. Do what feels the best to you and gives the most relief.  4) You may use acetaminophen (Tylenol) or ibuprofen (Motrin, Advil) to control pain, unless another pain medicine was prescribed. [ NOTE : If you have chronic liver or kidney disease or ever had a stomach ulcer or GI bleeding, talk with your doctor before using these medicines.]  FOLLOW UP with your physician or this facility if your symptoms do not show signs of improvement after one week. Physical therapy may be needed.  [NOTE: A radiologist will review any X-rays or CT scans that were taken. We will notify you of any new findings that may affect your care.]  GET PROMPT MEDICAL ATTENTION if any of the following occur:  -- Pain becomes worse or spreads into your  arms  -- Weakness or numbness in one or both arms   2000-2011 [employer], [address], [place], [place] [address]. All rights reserved. This information is not intended as a substitute for professional medical care. Always follow your healthcare professional's instructions.
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
## 2010-12-13 ENCOUNTER — Ambulatory Visit
Admit: 2010-12-13 | Discharge: 2010-12-13 | Disposition: A | Discharge status: Home / Self Care | Payer: Self-pay | Source: Ambulatory Visit

## 2010-12-14 ENCOUNTER — Encounter (INDEPENDENT_AMBULATORY_CARE_PROVIDER_SITE_OTHER): Payer: Self-pay

## 2010-12-14 ENCOUNTER — Ambulatory Visit (INDEPENDENT_AMBULATORY_CARE_PROVIDER_SITE_OTHER): Payer: BC Managed Care – PPO | Admitting: Family

## 2010-12-14 VITALS — BP 127/80 | HR 74 | Temp 98.0°F | Resp 18 | Ht 69.0 in | Wt 213.0 lb

## 2010-12-14 DIAGNOSIS — L255 Unspecified contact dermatitis due to plants, except food: Secondary | ICD-10-CM

## 2010-12-14 MED ORDER — PREDNISONE 20 MG PO TABS
ORAL_TABLET | ORAL | Status: AC
Start: 2010-12-14 — End: 2010-12-24

## 2010-12-14 NOTE — Patient Instructions (Signed)
POISON IVY RASH  The rash and itching that you have is a delayed reaction to the oils of the Poison Ivy plant, which you probably came in contact with during the three days before your symptoms began. The skin will become red and itchy. Small blisters may appear, which can break and leak a clear yellow fluid. This fluid is not contagious to others. The reaction usually starts to go away after 1-2 weeks, but may take 4-6 weeks to fully clear.    HOME CARE:   The plant oils that remain on your skin or clothes can be spread further on your own body and passed to others causing a similar reaction. Therefore, it is important to wash all of the plant oils off of your skin and any clothes that may have been exposed.   Zanfel (tm) is a new product that removes poison oak/ivy oils from the skin after exposure. It can be used before or after the rash appears. Relief from itching, redness and swelling often occurs within minutes after the first treatment. A second treatment 24 hours later may be needed. It is sold without a prescription.   Tecnu (tm) is another product that removes poison oak/ivy oils from the skin after exposure. It should not be used to treat the rash.   Wash all clothes that you were wearing in hot water with ordinary laundry detergent.   Avoid anything that heats up your skin (hot showers/baths, direct sunlight) since this will tend to make itching worse.   For weeping, blistered areas apply cold compresses. (Dip face cloth into a mixture of one pint of cold water with one packet of Domeboro Powder or Aveeno Oatmeal powder (available at drug stores)). This should be done for 30 minutes three times a day. Keep the solution refrigerated for future use. If large areas of skin are involved, you may take a lukewarm bath with one cup of cornstarch added to the water.   For localized rash, use hydrocortisone cream (available over-the-counter) for redness and irritation, unless another medicine was  prescribed. For severe itching, apply an ice compress locally. You can also use benzocaine anesthetic cream or spray (available at drug and grocery stores as Lanacaine or Solarcaine).   Oral Benadryl (diphenhydramine) is an antihistamine available at drug and grocery stores. Unless a prescription antihistamine was given, Benadryl may be used to reduce itching if large areas of the skin are involved. Use lower doses during the daytime and higher doses at bedtime since the drug may make you sleepy. [NOTE: Do not use Benadryl if you have glaucoma or if you are a man with trouble urinating due to an enlarged prostate.] Claritin (loratidine) is an antihistamine that causes less drowsiness and is a good alternative for daytime use.  FOLLOW UP with your doctor or this facility if the rash gets worse or if you are not starting to improve after 1 week of treatment.  GET PROMPT MEDICAL ATTENTION if any of the following occur:   Severe swelling in the face, eyelids, mouth, throat or tongue   Spreading facial rash with swelling of mouth or eyelids   Rash that spreads to the groin and causes swelling of the penis, scrotum or vaginal area   Difficulty urinating due to swelling in the genital area   Signs of infection in the areas of broken blisters:   Spreading redness   Pus or fluid draining from the blisters   Yellow-Obrian Bulson crusts form over the open blisters   Fever   of 100.4F (38C) or higher, or as directed by your healthcare provider   2000-2011 [employer], [address], [place] [address]. All rights reserved. This information is not intended as a substitute for professional medical care. Always follow your healthcare professional's instructions.

## 2010-12-14 NOTE — Progress Notes (Signed)
Subjective:       Patient ID: Trust Leh III is a 45 y.o. male.    Rash  This is a new problem. The current episode started today. The problem has been gradually worsening since onset. The affected locations include the right arm and left arm. Rash characteristics: red papular. Pertinent negatives include no fever. Past treatments include nothing. The treatment provided no relief.       The following portions of the patient's history were reviewed and updated as appropriate: allergies, current medications, past family history, past medical history, past social history, past surgical history and problem list.    Review of Systems   Constitutional: Negative for fever.   Skin: Positive for rash.   All other systems reviewed and are negative.            Objective:    Physical Exam   Nursing note and vitals reviewed.  Constitutional: He is oriented to person, place, and time. He appears well-developed and well-nourished.   HENT:   Head: Normocephalic.   Cardiovascular: Normal heart sounds.    Pulmonary/Chest: Breath sounds normal.   Musculoskeletal: Normal range of motion.   Neurological: He is alert and oriented to person, place, and time.   Skin: Skin is warm and dry.        Papular rash to bilateral upper ext's   Psychiatric: He has a normal mood and affect. His behavior is normal.           Assessment:       1. Contact dermatitis due to plant            Plan:       Prednisone  allegra

## 2011-01-23 ENCOUNTER — Other Ambulatory Visit: Payer: Self-pay | Admitting: Internal Medicine

## 2011-02-12 ENCOUNTER — Telehealth: Payer: Self-pay

## 2011-02-12 MED ORDER — CIPROFLOXACIN HCL 500 MG PO TABS: 500.0000 mg | ORAL_TABLET | Freq: Two times a day (BID) | ORAL | Status: AC

## 2011-02-12 NOTE — Telephone Encounter (Signed)
rx sent to pharmacy

## 2011-02-12 NOTE — Telephone Encounter (Signed)
Pt sates he has bronchitis or the flu; symptoms are brown-yellow mucus, skin and joints are sore, low grade fever of 99.7, runny nose, and congestion. Pt would like cipro called in to [facility] on [address]. Pls advise.

## 2011-02-12 NOTE — Telephone Encounter (Signed)
Per dr Arnoldo Morale- may have cipro 500 bid for 10 days

## 2011-05-15 ENCOUNTER — Other Ambulatory Visit: Payer: Self-pay | Admitting: *Deleted

## 2011-05-15 MED ORDER — TESTOSTERONE CYPIONATE 200 MG/ML IM SOLN: 200.0000 mg | INTRAMUSCULAR | Status: DC

## 2011-05-24 ENCOUNTER — Ambulatory Visit (INDEPENDENT_AMBULATORY_CARE_PROVIDER_SITE_OTHER): Payer: Managed Care, Other (non HMO) | Admitting: Internal Medicine

## 2011-05-24 ENCOUNTER — Encounter: Payer: Self-pay | Admitting: Internal Medicine

## 2011-05-24 VITALS — BP 150/90 | HR 76 | Temp 98.3°F | Resp 16 | Ht 68.0 in | Wt 216.0 lb

## 2011-05-24 DIAGNOSIS — E291 Testicular hypofunction: Secondary | ICD-10-CM

## 2011-05-24 DIAGNOSIS — R635 Abnormal weight gain: Secondary | ICD-10-CM

## 2011-05-24 DIAGNOSIS — F39 Unspecified mood [affective] disorder: Secondary | ICD-10-CM

## 2011-05-24 DIAGNOSIS — R5381 Other malaise: Secondary | ICD-10-CM

## 2011-05-24 DIAGNOSIS — R5383 Other fatigue: Secondary | ICD-10-CM

## 2011-05-24 LAB — TSH: TSH: 0.93 u[IU]/mL (ref 0.35–5.50)

## 2011-05-24 MED ORDER — BUPROPION HCL ER (XL) 150 MG PO TB24: 150.0000 mg | ORAL_TABLET | Freq: Every day | ORAL | Status: DC

## 2011-05-24 NOTE — Patient Instructions (Addendum)
The patient is instructed to continue all medications as prescribed. Schedule followup with check out clerk upon leaving the clinic Cut the celexa in 1/2 and start the Wellbutrin in the AM.

## 2011-05-24 NOTE — Progress Notes (Signed)
  Subjective:    Patient ID: [name], male    DOB: [birth year], 46 y.o.   MRN: [id number]  HPI New onset  HTN with weight gain Has developed increased lethargy, loss of energy, lack of concentration and focus Hx of depression Hx of hypogonadism Libido low   Review of Systems  Constitutional: Positive for fatigue. Negative for fever.  HENT: Negative for hearing loss, congestion, neck pain and postnasal drip.   Eyes: Negative for discharge, redness and visual disturbance.  Respiratory: Negative for cough, shortness of breath and wheezing.   Cardiovascular: Negative for leg swelling.  Gastrointestinal: Negative for abdominal pain, constipation and abdominal distention.  Genitourinary: Negative for urgency and frequency.  Musculoskeletal: Negative for joint swelling and arthralgias.  Skin: Negative for color change and rash.  Neurological: Positive for weakness. Negative for light-headedness.  Hematological: Negative for adenopathy.  Psychiatric/Behavioral: Negative for behavioral problems.   Past Medical History  Diagnosis Date  . Ulcerative colitis, unspecified   . Depression   . Hypogonadism male     History   Social History  . Marital Status: Divorced    Spouse Name: N/A    Number of Children: N/A  . Years of Education: N/A   Occupational History  . Not on file.   Social History Main Topics  . Smoking status: Never Smoker   . Smokeless tobacco: Not on file  . Alcohol Use: No  . Drug Use: No  . Sexually Active: Yes   Other Topics Concern  . Not on file   Social History Narrative  . No narrative on file    Past Surgical History  Procedure Date  . Tatoo     Family History  Problem Relation Age of Onset  . COPD Father   . Lung cancer      Allergies  Allergen Reactions  . Metronidazole     REACTION: Fatigue    Current Outpatient Prescriptions on File Prior to Visit  Medication Sig Dispense Refill  . citalopram (CELEXA) 20 MG tablet TAKE 1  TABLET BY MOUTH EVERY DAY  30 tablet  5  . Inositol Niacinate (NIACIN FLUSH FREE) 500 MG CAPS Take by mouth 46 HOURS.        Marland Kitchen testosterone cypionate (DEPOTESTOTERONE CYPIONATE) 200 MG/ML injection Inject 1 mL (200 mg total) into the muscle every 21 ( twenty-one) days.  10 mL  0    BP 150/90  Pulse 76  Temp 98.3 F (36.8 C)  Resp 16  Ht 5' 8"  (1.727 m)  Wt 216 lb (97.977 kg)  BMI 32.84 kg/m2       Objective:   Physical Exam  Constitutional: He appears well-developed and well-nourished.  HENT:  Head: Normocephalic and atraumatic.  Eyes: Conjunctivae are normal. Pupils are equal, round, and reactive to light.  Neck: Normal range of motion. Neck supple.  Cardiovascular: Normal rate and regular rhythm.   Pulmonary/Chest: Effort normal and breath sounds normal.  Abdominal: Soft. Bowel sounds are normal.          Assessment & Plan:  Weight loss with control of salt and monitor blood pressure Measure testosterone and adjust dose as needed. monitor thyroid for metabolic cause of weight loss.

## 2011-05-27 LAB — TESTOSTERONE, FREE, TOTAL, SHBG
Sex Hormone Binding: 14 nmol/L (ref 13–71)
Testosterone, Free: 184.1 pg/mL (ref 47.0–244.0)
Testosterone-% Free: 3.1 % — ABNORMAL HIGH (ref 1.6–2.9)
Testosterone: 589.06 ng/dL (ref 250–890)

## 2011-06-18 ENCOUNTER — Telehealth: Payer: Self-pay | Admitting: *Deleted

## 2011-06-18 NOTE — Telephone Encounter (Signed)
Pt informed and labs ordered

## 2011-06-18 NOTE — Telephone Encounter (Signed)
Per dr Baldo Daub- may have lab-hsv 1 and 2-pt informed and will co me tomorrow for labs work

## 2011-06-18 NOTE — Telephone Encounter (Signed)
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

## 2011-06-19 ENCOUNTER — Telehealth: Payer: Self-pay | Admitting: Internal Medicine

## 2011-06-19 ENCOUNTER — Other Ambulatory Visit: Payer: Managed Care, Other (non HMO)

## 2011-06-19 DIAGNOSIS — IMO0001 Reserved for inherently not codable concepts without codable children: Secondary | ICD-10-CM

## 2011-06-19 NOTE — Telephone Encounter (Signed)
Patient need a refill on his nexium. Please assist and inform patient when done.

## 2011-06-19 NOTE — Telephone Encounter (Signed)
Not listed- talked with pt and he states dr Arnoldo Morale gave him samples several years ago and now is requesting med- per dr Lorenda Peck- have pt try otc prilosec- pt informed

## 2011-06-27 ENCOUNTER — Ambulatory Visit (INDEPENDENT_AMBULATORY_CARE_PROVIDER_SITE_OTHER): Payer: Managed Care, Other (non HMO) | Admitting: Family

## 2011-06-27 ENCOUNTER — Encounter: Payer: Self-pay | Admitting: Family

## 2011-06-27 VITALS — BP 120/90 | Temp 98.2°F | Wt 206.0 lb

## 2011-06-27 DIAGNOSIS — F411 Generalized anxiety disorder: Secondary | ICD-10-CM

## 2011-06-27 DIAGNOSIS — F419 Anxiety disorder, unspecified: Secondary | ICD-10-CM

## 2011-06-27 NOTE — Progress Notes (Signed)
  Subjective:    Patient ID: [name], male    DOB: [birth year], 46 y.o.   MRN: [id number]  HPI 46 year old white male, nonsmoker, patient of Dr. Arnoldo Morale is in today for recheck of his medication. He was recently decreased to 10 mg a day and Celexa because it was causing drowsiness and decreased awareness. He reports now being happy with the medication. However, Dr. Arnoldo Morale initiated Wellbutrin 250 mg that he is not tolerating well and has since discontinued. Patient has a girlfriend who is terminally ill. He is unsure of how much longer she will be living.   Review of Systems  Constitutional: Negative.   HENT: Negative.   Respiratory: Negative.   Cardiovascular: Negative.   Psychiatric/Behavioral: Negative.    Past Medical History  Diagnosis Date  . Ulcerative colitis, unspecified   . Depression   . Hypogonadism male     History   Social History  . Marital Status: Divorced    Spouse Name: N/A    Number of Children: N/A  . Years of Education: N/A   Occupational History  . Not on file.   Social History Main Topics  . Smoking status: Never Smoker   . Smokeless tobacco: Not on file  . Alcohol Use: No  . Drug Use: No  . Sexually Active: Yes   Other Topics Concern  . Not on file   Social History Narrative  . No narrative on file    Past Surgical History  Procedure Date  . Tatoo     Family History  Problem Relation Age of Onset  . COPD Father   . Lung cancer      Allergies  Allergen Reactions  . Metronidazole     REACTION: Fatigue    Current Outpatient Prescriptions on File Prior to Visit  Medication Sig Dispense Refill  . citalopram (CELEXA) 20 MG tablet TAKE 1 TABLET BY MOUTH EVERY DAY  30 tablet  5  . Inositol Niacinate (NIACIN FLUSH FREE) 500 MG CAPS Take by mouth 46 HOURS.        Marland Kitchen testosterone cypionate (DEPOTESTOTERONE CYPIONATE) 200 MG/ML injection Inject 1 mL (200 mg total) into the muscle every 21 ( twenty-one) days.  10 mL  0  . buPROPion  (WELLBUTRIN XL) 150 MG 24 hr tablet Take 1 tablet (150 mg total) by mouth daily.  30 tablet  11    BP 120/90  Temp(Src) 98.2 F (36.8 C) (Oral)  Wt 206 lb (93.441 kg)chart    Objective:   Physical Exam  Constitutional: He is oriented to person, place, and time. He appears well-developed and well-nourished.  Neck: Normal range of motion. Neck supple.  Cardiovascular: Normal rate, regular rhythm and normal heart sounds.   Pulmonary/Chest: Effort normal and breath sounds normal.  Neurological: He is alert and oriented to person, place, and time.  Skin: Skin is warm and dry.  Psychiatric: He has a normal mood and affect.          Assessment & Plan:  Assessment: Anxiety-stable  Plan: Continue Celexa at 10 mg. Since he is discontinue Wellbutrin already will officially discontinue it. Patient will remain on Celexa and discuss discontinuation in several months if that something that he will like to consider.

## 2011-07-01 ENCOUNTER — Ambulatory Visit: Payer: Managed Care, Other (non HMO) | Admitting: Internal Medicine

## 2011-09-17 ENCOUNTER — Encounter: Payer: Self-pay | Admitting: Internal Medicine

## 2011-10-08 ENCOUNTER — Other Ambulatory Visit: Payer: Self-pay

## 2011-10-08 ENCOUNTER — Other Ambulatory Visit: Payer: Self-pay | Admitting: Internal Medicine

## 2011-10-08 MED ORDER — CITALOPRAM HYDROBROMIDE 20 MG PO TABS: 20.0000 mg | ORAL_TABLET | Freq: Every day | ORAL | Status: DC

## 2011-10-08 NOTE — Telephone Encounter (Signed)
Last filled 01/23/11. Last OV 06/27/11. Ok to refill?

## 2011-10-09 NOTE — Telephone Encounter (Signed)
Rx called in to [facility]

## 2011-10-28 ENCOUNTER — Encounter: Payer: Self-pay | Admitting: Internal Medicine

## 2011-10-28 ENCOUNTER — Ambulatory Visit (INDEPENDENT_AMBULATORY_CARE_PROVIDER_SITE_OTHER): Payer: Managed Care, Other (non HMO) | Admitting: Internal Medicine

## 2011-10-28 VITALS — BP 130/82 | HR 77 | Temp 98.5°F | Wt 210.0 lb

## 2011-10-28 DIAGNOSIS — M79609 Pain in unspecified limb: Secondary | ICD-10-CM

## 2011-10-28 DIAGNOSIS — R21 Rash and other nonspecific skin eruption: Secondary | ICD-10-CM

## 2011-10-28 DIAGNOSIS — T148XXA Other injury of unspecified body region, initial encounter: Secondary | ICD-10-CM

## 2011-10-28 DIAGNOSIS — M79606 Pain in leg, unspecified: Secondary | ICD-10-CM | POA: Insufficient documentation

## 2011-10-28 DIAGNOSIS — W57XXXA Bitten or stung by nonvenomous insect and other nonvenomous arthropods, initial encounter: Secondary | ICD-10-CM

## 2011-10-28 MED ORDER — DOXYCYCLINE HYCLATE 100 MG PO CAPS: 100.0000 mg | ORAL_CAPSULE | Freq: Two times a day (BID) | ORAL | Status: AC

## 2011-10-28 NOTE — Patient Instructions (Addendum)
  Add doxycycline antibiotic in case this is an early bacterial infection or insect related infection although this isn't typical. I see no evidence of an abscess or bloodstream infection.  Would also treat the bottom of your foot for potential atypical athletes feet with over-the-counter Lamisil topical for one to 2 weeks. The pain in your leg sounds almost like a neuralgia neuritis pain. If you're having progression of discomfort or new rashes  please contact us for possible reevaluation.

## 2011-10-28 NOTE — Progress Notes (Signed)
  Subjective:    Patient ID: [name], male    DOB: September 06, 1965, 46 y.o.   MRN: [id number]  HPI Patient comes in today for SDA for  new problem evaluation. Patient comes in with problem of an insect bite and pain in his left lower extremity. Onset about 3 days ago of what looked like a bug bite on the top of his left foot 21 inch tall grass to get ball for his dogs. It looked like a little pustule that pops and now is trying to heal ;never had severe itching or pain tear. However now he has some discomfort on the bottom of his foot medially at its heel and metatarsal area but there is a small bump on his sole. He also has some tenderness around his great toe that he thought might of been ingrowing nail however has a bump there also.  On line is worries about possible cost. Also has noted some soreness in the scan in the posterior calf and the anterior thigh of the left leg. No history of same no rash in this area. No history of shingles or HSV.   Review of Systems Negative for fever syncope rest as per history of present illness was in the [place] in the past and had a history of athletes feet. Past history family history social history reviewed in the electronic medical record.     Objective:   Physical Exam  BP 130/82  Pulse 77  Temp 98.5 F (36.9 C) (Oral)  Wt 210 lb (95.255 kg)  SpO2 98% Well-developed well-nourished in no acute distress left lower extremity pinpoint scab creator 1 mm dorsum of left foot with minimally surrounding erythema and induration it is nontender about 1 cm. There is no streaking at this time nor edema of the foot plantar surface shows tenderness at that he'll without lesion one specific bump at the metatarsal area that is mildly tender some other dyshidrotic-looking rash on the bottom of his foot it isn't itchy or tender no interdigital rash toenails are hyperkeratotic there is 1 papule on the medial right great toe it is tender. Otherwise no edema that  extremity has some varicose veins no streaking. No acute joint swelling.    Assessment & Plan:   Left lower extremity pain history of insect bite and some rash  Difficult to explain the whole scenario with one diagnosis bottom of the foot looks a bit like dyshidrotic eczema or tinea pedis but doesn't fit otherwise. Papule on top of foot certainly could be an insect bite. But does not look primarily infected. Pain in his leg sounds like a neuritis with nothing else to explain it.  Empiric treatment with doxycycline as discussed if he develops progressive rash such as a shingles type rash he should contact us for further intervention.  Do not expect any serious complication at this time if this was a spider or other insect bite.

## 2011-11-09 ENCOUNTER — Other Ambulatory Visit: Payer: Self-pay | Admitting: Internal Medicine

## 2011-12-19 ENCOUNTER — Other Ambulatory Visit: Payer: Self-pay | Admitting: Internal Medicine

## 2011-12-26 ENCOUNTER — Ambulatory Visit: Payer: Managed Care, Other (non HMO) | Admitting: Family

## 2012-03-02 ENCOUNTER — Encounter: Payer: Self-pay | Admitting: Family

## 2012-03-02 ENCOUNTER — Ambulatory Visit (INDEPENDENT_AMBULATORY_CARE_PROVIDER_SITE_OTHER): Payer: Managed Care, Other (non HMO) | Admitting: Family

## 2012-03-02 VITALS — BP 128/90 | HR 72 | Temp 98.2°F | Wt 219.0 lb

## 2012-03-02 DIAGNOSIS — K122 Cellulitis and abscess of mouth: Secondary | ICD-10-CM

## 2012-03-02 DIAGNOSIS — J029 Acute pharyngitis, unspecified: Secondary | ICD-10-CM

## 2012-03-02 MED ORDER — AMOXICILLIN-POT CLAVULANATE 875-125 MG PO TABS: 1.0000 | ORAL_TABLET | Freq: Two times a day (BID) | ORAL | Status: DC

## 2012-03-02 NOTE — Patient Instructions (Addendum)
Uvulitis Uvulitis is redness and soreness (inflammation) of the uvula. The uvula is the small tongue-shaped piece of tissue in the back of your mouth.  CAUSES Infection is a common cause of uvulitis. Infection of the uvula can be either viral or bacterial. Infectious uvulitis usually only occurs in association with another condition, such as inflammation and infection of the mouth or throat. Other causes of uvulitis include:  Trauma to the uvula.  Swelling from excess fluid buildup (edema), which may be an allergic reaction.  Inhalation of irritants, such as chemical agents, smoke, or steam. DIAGNOSIS Your caregiver can usually diagnose uvulitis through a physical examination. Bacterial uvulitis can be diagnosed through the results of the growth of samples of bodily substances taken from your mouth (cultures). HOME CARE INSTRUCTIONS   Rest as much as possible.  Young children may suck on frozen juice bars or frozen ice pops. Older children and adults may gargle with a warm or cold liquid to help soothe the throat. (Mix  tsp of salt in 8 oz of water, or use strong tea.)  Use a cool-mist humidifier to lessen throat irritation and cough.  Drink enough fluids to keep your urine clear or pale yellow.  While the throat is very sore, eat soft or liquid foods such as milk, ice cream, soups, or milk drinks.  Family members who develop a sore throat or fever should have a medical exam or throat culture.  If your child has uvulitis and is taking antibiotic medicine, wait 24 hours or until his or her temperature is near normal (less than 100 F [37.8 C]) before allowing him or her to return to school or day care.  Only take over-the-counter or prescription medicines for pain, discomfort, or fever as directed by your caregiver. Ask when your test results will be ready. Make sure you get your test results. SEEK MEDICAL CARE IF:   You have an oral temperature above 102 F (38.9 C).  You  develop large, tender lumps your the neck.  Your child develops a rash.  You cough up green, yellow-brown, or bloody substances. SEEK IMMEDIATE MEDICAL CARE IF:   You develop any new symptoms, such as vomiting, earache, severe headache, stiff neck, chest pain, or trouble breathing or swallowing.  Your airway is blocked.  You develop more severe throat pain along with drooling or voice changes. Document Released: 09/29/2003 Document Revised: 05/13/2011 Document Reviewed: 04/26/2010 [facility] Patient Information 2013 [place].

## 2012-03-02 NOTE — Progress Notes (Signed)
  Subjective:    Patient ID: [name], male    DOB: 05/30/1965, 46 y.o.   MRN: [id number]  HPI 46 year old white male, nonsmoker, patient of Dr. Arnoldo Morale is in today with a swollen uvula and sore throat x2 days. Patient has had 2 other episodes of this that at both responded to antibiotics. The etiology is unknown. He denies any smoking, new foods, or beverages.   Review of Systems  Constitutional: Negative.  Negative for fever.  HENT: Positive for sore throat. Negative for sneezing and postnasal drip.        Swollen uvula  Respiratory: Negative.   Cardiovascular: Negative.   Musculoskeletal: Negative.   Skin: Negative.   Hematological: Negative.   Psychiatric/Behavioral: Negative.    Past Medical History  Diagnosis Date  . Ulcerative colitis, unspecified   . Depression   . Hypogonadism male     History   Social History  . Marital Status: Divorced    Spouse Name: N/A    Number of Children: N/A  . Years of Education: N/A   Occupational History  . Not on file.   Social History Main Topics  . Smoking status: Never Smoker   . Smokeless tobacco: Not on file  . Alcohol Use: No  . Drug Use: No  . Sexually Active: Yes   Other Topics Concern  . Not on file   Social History Narrative  . No narrative on file    Past Surgical History  Procedure Date  . Tatoo     Family History  Problem Relation Age of Onset  . COPD Father   . Lung cancer      Allergies  Allergen Reactions  . Metronidazole     REACTION: Fatigue    Current Outpatient Prescriptions on File Prior to Visit  Medication Sig Dispense Refill  . citalopram (CELEXA) 20 MG tablet Take 1 tablet (20 mg total) by mouth daily.  30 tablet  5  . citalopram (CELEXA) 20 MG tablet TAKE 1 TABLET BY MOUTH EVERY DAY  30 tablet  5  . Inositol Niacinate (NIACIN FLUSH FREE) 500 MG CAPS Take by mouth 46 HOURS.        Marland Kitchen testosterone cypionate (DEPOTESTOTERONE CYPIONATE) 200 MG/ML injection INJECT 1 ML EVERY MONTH   10 mL  0    BP 128/90  Pulse 72  Temp 98.2 F (36.8 C)  Wt 219 lb (99.338 kg)  SpO2 99%chart    Objective:   Physical Exam  Constitutional: He is oriented to person, place, and time. He appears well-developed and well-nourished.  HENT:  Right Ear: External ear normal.  Left Ear: External ear normal.       Uvula red, edematous, but good air movement. Tonsils 2+  Neck: Normal range of motion. Neck supple.  Cardiovascular: Normal rate, regular rhythm and normal heart sounds.   Pulmonary/Chest: Effort normal and breath sounds normal.  Neurological: He is alert and oriented to person, place, and time.  Skin: Skin is warm and dry.  Psychiatric: He has a normal mood and affect.          Assessment & Plan:  Assessment: Uvulitis, sore throat  Plan: Augmentin 875 one by mouth twice a day with food x10 days. Consider referral to ENT if his symptoms persist. Recheck a schedule, and as needed.

## 2012-05-09 ENCOUNTER — Encounter (HOSPITAL_COMMUNITY): Payer: Self-pay | Admitting: Anesthesiology

## 2012-05-09 ENCOUNTER — Emergency Department (HOSPITAL_COMMUNITY): Payer: Managed Care, Other (non HMO)

## 2012-05-09 ENCOUNTER — Encounter (HOSPITAL_COMMUNITY): Payer: Self-pay | Admitting: Physical Medicine and Rehabilitation

## 2012-05-09 ENCOUNTER — Encounter (HOSPITAL_COMMUNITY)
Admission: EM | Disposition: A | Discharge status: Home / Self Care | Payer: Self-pay | Source: Home / Self Care | Attending: Otolaryngology

## 2012-05-09 ENCOUNTER — Inpatient Hospital Stay: Admit: 2012-05-09 | Payer: Self-pay | Admitting: Otolaryngology

## 2012-05-09 ENCOUNTER — Inpatient Hospital Stay (HOSPITAL_COMMUNITY)
Admission: EM | Admit: 2012-05-09 | Discharge: 2012-05-16 | DRG: 012 | Disposition: A | Discharge status: Home / Self Care | Payer: Managed Care, Other (non HMO) | Attending: Otolaryngology | Admitting: Otolaryngology

## 2012-05-09 ENCOUNTER — Emergency Department (HOSPITAL_COMMUNITY): Payer: Managed Care, Other (non HMO) | Admitting: Anesthesiology

## 2012-05-09 DIAGNOSIS — IMO0002 Reserved for concepts with insufficient information to code with codable children: Secondary | ICD-10-CM | POA: Diagnosis present

## 2012-05-09 DIAGNOSIS — S02400B Malar fracture unspecified, initial encounter for open fracture: Principal | ICD-10-CM | POA: Diagnosis present

## 2012-05-09 DIAGNOSIS — F3289 Other specified depressive episodes: Secondary | ICD-10-CM | POA: Diagnosis present

## 2012-05-09 DIAGNOSIS — S022XXA Fracture of nasal bones, initial encounter for closed fracture: Secondary | ICD-10-CM

## 2012-05-09 DIAGNOSIS — W208XXA Other cause of strike by thrown, projected or falling object, initial encounter: Secondary | ICD-10-CM

## 2012-05-09 DIAGNOSIS — S0292XB Unspecified fracture of facial bones, initial encounter for open fracture: Secondary | ICD-10-CM

## 2012-05-09 DIAGNOSIS — S0285XA Fracture of orbit, unspecified, initial encounter for closed fracture: Secondary | ICD-10-CM

## 2012-05-09 DIAGNOSIS — E669 Obesity, unspecified: Secondary | ICD-10-CM | POA: Diagnosis present

## 2012-05-09 DIAGNOSIS — K5190 Ulcerative colitis, unspecified, without complications: Secondary | ICD-10-CM | POA: Diagnosis present

## 2012-05-09 DIAGNOSIS — S0181XA Laceration without foreign body of other part of head, initial encounter: Secondary | ICD-10-CM

## 2012-05-09 DIAGNOSIS — Y93H2 Activity, gardening and landscaping: Secondary | ICD-10-CM

## 2012-05-09 DIAGNOSIS — S0180XA Unspecified open wound of other part of head, initial encounter: Secondary | ICD-10-CM | POA: Diagnosis present

## 2012-05-09 DIAGNOSIS — F329 Major depressive disorder, single episode, unspecified: Secondary | ICD-10-CM | POA: Diagnosis present

## 2012-05-09 DIAGNOSIS — S02401A Maxillary fracture, unspecified, initial encounter for closed fracture: Secondary | ICD-10-CM

## 2012-05-09 DIAGNOSIS — S0280XA Fracture of other specified skull and facial bones, unspecified side, initial encounter for closed fracture: Secondary | ICD-10-CM | POA: Diagnosis present

## 2012-05-09 DIAGNOSIS — S02609B Fracture of mandible, unspecified, initial encounter for open fracture: Secondary | ICD-10-CM

## 2012-05-09 DIAGNOSIS — Z6832 Body mass index (BMI) 32.0-32.9, adult: Secondary | ICD-10-CM

## 2012-05-09 DIAGNOSIS — E291 Testicular hypofunction: Secondary | ICD-10-CM | POA: Diagnosis present

## 2012-05-09 DIAGNOSIS — S02609S Fracture of mandible, unspecified, sequela: Secondary | ICD-10-CM

## 2012-05-09 DIAGNOSIS — S0280XB Fracture of other specified skull and facial bones, unspecified side, initial encounter for open fracture: Secondary | ICD-10-CM

## 2012-05-09 DIAGNOSIS — J342 Deviated nasal septum: Secondary | ICD-10-CM

## 2012-05-09 DIAGNOSIS — S01501A Unspecified open wound of lip, initial encounter: Secondary | ICD-10-CM | POA: Diagnosis present

## 2012-05-09 DIAGNOSIS — R040 Epistaxis: Secondary | ICD-10-CM

## 2012-05-09 DIAGNOSIS — S02401B Maxillary fracture, unspecified, initial encounter for open fracture: Principal | ICD-10-CM

## 2012-05-09 HISTORY — PX: TRACHEOSTOMY TUBE PLACEMENT: SHX814

## 2012-05-09 HISTORY — PX: ORIF MANDIBULAR FRACTURE: SHX2127

## 2012-05-09 LAB — TYPE AND SCREEN
ABO/RH(D): A POS
Antibody Screen: NEGATIVE

## 2012-05-09 LAB — COMPREHENSIVE METABOLIC PANEL
ALT: 35 U/L (ref 0–53)
AST: 27 U/L (ref 0–37)
Albumin: 4.3 g/dL (ref 3.5–5.2)
CO2: 24 mEq/L (ref 19–32)
Chloride: 99 mEq/L (ref 96–112)
GFR calc Af Amer: >90 mL/min (ref >90)
GFR calc non Af Amer: >90 mL/min (ref >90)
Sodium: 137 mEq/L (ref 135–145)
Total Protein: 7.8 g/dL (ref 6.0–8.3)

## 2012-05-09 LAB — CBC WITH DIFFERENTIAL/PLATELET
Basophils Absolute: 0 10*3/uL (ref 0.0–0.1)
Hemoglobin: 15.9 g/dL (ref 13.0–17.0)
Lymphs Abs: 3.8 10*3/uL (ref 0.7–4.0)
Monocytes Relative: 10 % (ref 3–12)
Neutrophils Relative %: 44 % (ref 43–77)
Platelets: 267 10*3/uL (ref 150–400)
RBC: 5.15 MIL/uL (ref 4.22–5.81)
RDW: 13.1 % (ref 11.5–15.5)

## 2012-05-09 LAB — PROTIME-INR: INR: 0.91 (ref 0.00–1.49)

## 2012-05-09 SURGERY — OPEN REDUCTION INTERNAL FIXATION (ORIF) MANDIBULAR FRACTURE
Anesthesia: General | Site: Neck | Wound class: Clean Contaminated

## 2012-05-09 MED ORDER — ONDANSETRON HCL 4 MG/2ML IJ SOLN
INTRAMUSCULAR | Status: DC | PRN
  Administered 2012-05-09: 4 mg via INTRAVENOUS

## 2012-05-09 MED ORDER — LACTATED RINGERS IV SOLN
INTRAVENOUS | Status: DC | PRN
  Administered 2012-05-09 (×3): via INTRAVENOUS

## 2012-05-09 MED ORDER — ALBUMIN HUMAN 5 % IV SOLN
INTRAVENOUS | Status: DC | PRN
  Administered 2012-05-09 (×2): via INTRAVENOUS

## 2012-05-09 MED ORDER — SUCCINYLCHOLINE CHLORIDE 20 MG/ML IJ SOLN
INTRAMUSCULAR | Status: DC | PRN
  Administered 2012-05-09: 140 mg via INTRAVENOUS

## 2012-05-09 MED ORDER — DEXAMETHASONE SODIUM PHOSPHATE 10 MG/ML IJ SOLN
INTRAMUSCULAR | Status: DC | PRN
  Administered 2012-05-09: 8 mg via INTRAVENOUS

## 2012-05-09 MED ORDER — 0.9 % SODIUM CHLORIDE (POUR BTL) OPTIME
TOPICAL | Status: DC | PRN
  Administered 2012-05-09: 1000 mL

## 2012-05-09 MED ORDER — FENTANYL CITRATE 0.05 MG/ML IJ SOLN
INTRAMUSCULAR | Status: DC | PRN
  Administered 2012-05-09: 150 ug via INTRAVENOUS
  Administered 2012-05-09 – 2012-05-10 (×3): 100 ug via INTRAVENOUS

## 2012-05-09 MED ORDER — CEFAZOLIN SODIUM 1-5 GM-% IV SOLN
INTRAVENOUS | Status: AC
  Filled 2012-05-09: qty 100

## 2012-05-09 MED ORDER — ROCURONIUM BROMIDE 100 MG/10ML IV SOLN
INTRAVENOUS | Status: DC | PRN
  Administered 2012-05-09: 25 mg via INTRAVENOUS
  Administered 2012-05-09 (×2): 15 mg via INTRAVENOUS
  Administered 2012-05-09: 25 mg via INTRAVENOUS

## 2012-05-09 MED ORDER — CEFAZOLIN SODIUM-DEXTROSE 2-3 GM-% IV SOLR
2.0000 g | Freq: Once | INTRAVENOUS | Status: AC
  Administered 2012-05-09: 2 g via INTRAVENOUS
  Filled 2012-05-09: qty 50

## 2012-05-09 MED ORDER — TETANUS-DIPHTH-ACELL PERTUSSIS 5-2.5-18.5 LF-MCG/0.5 IM SUSP
0.5000 mL | Freq: Once | INTRAMUSCULAR | Status: AC
  Administered 2012-05-09: 0.5 mL via INTRAMUSCULAR
  Filled 2012-05-09: qty 0.5

## 2012-05-09 MED ORDER — MIDAZOLAM HCL 5 MG/5ML IJ SOLN
INTRAMUSCULAR | Status: DC | PRN
  Administered 2012-05-09: 2 mg via INTRAVENOUS

## 2012-05-09 MED ORDER — FENTANYL CITRATE 0.05 MG/ML IJ SOLN
50.0000 ug | Freq: Once | INTRAMUSCULAR | Status: AC
  Administered 2012-05-09: 50 ug via INTRAVENOUS
  Filled 2012-05-09: qty 2

## 2012-05-09 MED ORDER — PROPOFOL 10 MG/ML IV BOLUS
INTRAVENOUS | Status: DC | PRN
  Administered 2012-05-09: 200 mg via INTRAVENOUS

## 2012-05-09 MED ORDER — HYDROMORPHONE HCL PF 1 MG/ML IJ SOLN
1.0000 mg | Freq: Once | INTRAMUSCULAR | Status: AC
  Administered 2012-05-09: 1 mg via INTRAVENOUS
  Filled 2012-05-09: qty 1

## 2012-05-09 MED ORDER — LIDOCAINE-EPINEPHRINE 1 %-1:100000 IJ SOLN
INTRAMUSCULAR | Status: DC | PRN
  Administered 2012-05-09: 20 mL

## 2012-05-09 MED ORDER — SODIUM CHLORIDE 0.9 % IV BOLUS (SEPSIS)
1000.0000 mL | INTRAVENOUS | Status: AC
  Administered 2012-05-09: 1000 mL via INTRAVENOUS

## 2012-05-09 SURGICAL SUPPLY — 70 items
BIT DRILL TWIST 1.3X5 (BIT) ×2
BIT DRILL TWIST 1.3X5MM (BIT) IMPLANT
BIT DRILL TWIST 58MM 26MM WL (DRILL) IMPLANT
CANISTER SUCTION 2500CC (MISCELLANEOUS) ×3 IMPLANT
CLEANER TIP ELECTROSURG 2X2 (MISCELLANEOUS) ×3 IMPLANT
CLOTH BEACON ORANGE TIMEOUT ST (SAFETY) ×3 IMPLANT
COVER MAYO STAND STRL (DRAPES) ×1 IMPLANT
DRAPE U-SHAPE 76X120 STRL (DRAPES) ×1 IMPLANT
DRILL BIT TWIST 1.3X5MM (BIT) ×3
DRILL TWIST 58MM 26MM WL (DRILL) ×3
ELECT COATED BLADE 2.86 ST (ELECTRODE) ×3 IMPLANT
ELECT NDL TIP 2.8 STRL (NEEDLE) IMPLANT
ELECT NEEDLE TIP 2.8 STRL (NEEDLE) ×3 IMPLANT
ELECT REM PT RETURN 9FT ADLT (ELECTROSURGICAL) ×3
ELECTRODE REM PT RTRN 9FT ADLT (ELECTROSURGICAL) ×2 IMPLANT
GLOVE BIO SURGEON STRL SZ8 (GLOVE) ×4 IMPLANT
GLOVE BIOGEL M 7.0 STRL (GLOVE) ×5 IMPLANT
GLOVE BIOGEL PI IND STRL 7.0 (GLOVE) IMPLANT
GLOVE BIOGEL PI IND STRL 8 (GLOVE) IMPLANT
GLOVE BIOGEL PI INDICATOR 7.0 (GLOVE) ×1
GLOVE BIOGEL PI INDICATOR 8 (GLOVE) ×4
GLOVE ECLIPSE 6.5 STRL STRAW (GLOVE) ×1 IMPLANT
GLOVE SS BIOGEL STRL SZ 8 (GLOVE) IMPLANT
GLOVE SUPERSENSE BIOGEL SZ 8 (GLOVE) ×1
GLOVE SURG EUDERMIC 8 LTX PF (GLOVE) ×1 IMPLANT
GOWN BRE IMP SLV AUR XL STRL (GOWN DISPOSABLE) ×5 IMPLANT
GOWN STRL NON-REIN LRG LVL3 (GOWN DISPOSABLE) ×8 IMPLANT
HOLDER TRACH TUBE VELCRO 19.5 (MISCELLANEOUS) ×1 IMPLANT
HYDROGEN PEROXIDE 16OZ (MISCELLANEOUS) ×1 IMPLANT
KIT BASIN OR (CUSTOM PROCEDURE TRAY) ×3 IMPLANT
KIT ROOM TURNOVER OR (KITS) ×3 IMPLANT
NS IRRIG 1000ML POUR BTL (IV SOLUTION) ×3 IMPLANT
PAD ARMBOARD 7.5X6 YLW CONV (MISCELLANEOUS) ×6 IMPLANT
PATTIES SURGICAL .5 X3 (DISPOSABLE) IMPLANT
PENCIL BUTTON HOLSTER BLD 10FT (ELECTRODE) ×4 IMPLANT
PLATE MID FACE 5H T (Plate) ×1 IMPLANT
PLATE MID FACE 6H 8MM 100D LFT (Plate) ×1 IMPLANT
PLATE MID FACE 6H 8MM 100D RT (Plate) ×1 IMPLANT
PLATE MNDBLE FRACTURE 4H (Plate) ×1 IMPLANT
SCISSORS WIRE DISP (INSTRUMENTS) ×3 IMPLANT
SCREW MIDFACE 1.7X3MM SLF TAP (Screw) ×12 IMPLANT
SCREW MIDFACE 1.7X4MM SLF TAP (Screw) ×4 IMPLANT
SCREW MNDBLE 2.3X10MM BONE (Screw) ×1 IMPLANT
SCREW MNDBLE 2.3X12MM BONE (Screw) ×3 IMPLANT
SLEEVE SURGEON STRL (DRAPES) ×1 IMPLANT
SPLINT NASAL DOYLE BI-VL (GAUZE/BANDAGES/DRESSINGS) ×1 IMPLANT
STAPLER VISISTAT 35W (STAPLE) ×3 IMPLANT
SUT BONE WAX W31G (SUTURE) IMPLANT
SUT CHROMIC 3 0 PS 2 (SUTURE) ×1 IMPLANT
SUT CHROMIC 4 0 RB 1X27 (SUTURE) ×2 IMPLANT
SUT ETHILON 3 0 PS 1 (SUTURE) ×2 IMPLANT
SUT ETHILON 4 0 P 3 18 (SUTURE) IMPLANT
SUT ETHILON 4 0 PS 2 18 (SUTURE) ×2 IMPLANT
SUT ETHILON 5 0 P 3 18 (SUTURE)
SUT ETHILON 5 0 PS 2 18 (SUTURE) ×1 IMPLANT
SUT NYLON ETHILON 5-0 P-3 1X18 (SUTURE) IMPLANT
SUT SILK 3 0 (SUTURE)
SUT SILK 3-0 18XBRD TIE 12 (SUTURE) IMPLANT
SUT STEEL 0 (SUTURE)
SUT STEEL 0 18XMFL TIE 17 (SUTURE) IMPLANT
SUT STEEL 2 (SUTURE) ×3 IMPLANT
SUT VIC AB 3-0 FS2 27 (SUTURE) IMPLANT
SUT VIC AB 4-0 P-3 18X BRD (SUTURE) IMPLANT
SUT VIC AB 4-0 P3 18 (SUTURE) ×12
SUT VIC AB 5-0 P-3 18XBRD (SUTURE) IMPLANT
SUT VIC AB 5-0 P3 18 (SUTURE)
TOWEL OR 17X24 6PK STRL BLUE (TOWEL DISPOSABLE) ×3 IMPLANT
TOWEL OR 17X26 10 PK STRL BLUE (TOWEL DISPOSABLE) ×3 IMPLANT
TRAY ENT MC OR (CUSTOM PROCEDURE TRAY) ×3 IMPLANT
WATER STERILE IRR 1000ML POUR (IV SOLUTION) ×2 IMPLANT

## 2012-05-09 NOTE — ED Notes (Signed)
Report given to [facility]. Pt transported to OR. All belongings given to wife at bedside.

## 2012-05-09 NOTE — Anesthesia Preprocedure Evaluation (Addendum)
Anesthesia Evaluation  Patient identified by MRN, date of birth, ID band Patient awake    Reviewed: Allergy & Precautions, H&P , NPO status , Patient's Chart, lab work & pertinent test results  History of Anesthesia Complications Negative for: history of anesthetic complications  Airway Mallampati: III TM Distance: >3 FB Neck ROM: Full    Dental  (+) Dental Advisory Given, Loose and Chipped   Pulmonary neg pulmonary ROS, Recent URI , Resolved,  breath sounds clear to auscultation  Pulmonary exam normal       Cardiovascular negative cardio ROS  Rhythm:Regular Rate:Normal     Neuro/Psych PSYCHIATRIC DISORDERS negative neurological ROS     GI/Hepatic negative GI ROS, Neg liver ROS, Ulcerative colitis: no steroids since 1995   Endo/Other  Morbid obesity  Renal/GU negative Renal ROS     Musculoskeletal   Abdominal (+) + obese,   Peds  Hematology   Anesthesia Other Findings   Reproductive/Obstetrics                           Anesthesia Physical Anesthesia Plan  ASA: II and emergent  Anesthesia Plan: General   Post-op Pain Management:    Induction: Intravenous  Airway Management Planned: Oral ETT  Additional Equipment:   Intra-op Plan:   Post-operative Plan: Possible Post-op intubation/ventilation  Informed Consent: I have reviewed the patients History and Physical, chart, labs and discussed the procedure including the risks, benefits and alternatives for the proposed anesthesia with the patient or authorized representative who has indicated his/her understanding and acceptance.   Dental advisory given  Plan Discussed with: Surgeon and CRNA  Anesthesia Plan Comments: (Plan routine monitors, GETA with probable post op ventilation  )       Anesthesia Quick Evaluation

## 2012-05-09 NOTE — ED Notes (Addendum)
Pt presents to department for evaluation of facial injury. States he was cutting trees outside when limb stuck his nose and mouth. Nose and mouth actively bleeding upon arrival. Laceration noted to lower lip and chin. Several teeth are chipped and missing. 10/10 pain at the time. Pt is alert and oriented x4. Respirations unlabored.

## 2012-05-09 NOTE — ED Provider Notes (Signed)
History     CSN: [id number]  Arrival date & time 05/09/12  18   First MD Initiated Contact with Patient 05/09/12 1515      Chief Complaint  Patient presents with  . Facial Pain  . Epistaxis  . Facial Injury    (Consider location/radiation/quality/duration/timing/severity/associated sxs/prior treatment) Patient is a 47 y.o. male presenting with injury.  Injury  The incident occurred just prior to arrival (20 mins). The incident occurred at home. The injury mechanism was a direct blow (tree branch fell on face while cutting it down). The wounds were not self-inflicted. The protective equipment used includes a helmet. There is an injury to the face, chin, teeth and mouth. The pain is moderate. It is unknown if a foreign body is present. Pertinent negatives include no chest pain, no fussiness, no numbness, no abdominal pain, no nausea, no vomiting, no headaches, no inability to bear weight, no neck pain, no weakness, no cough and no difficulty breathing.    Past Medical History  Diagnosis Date  . Ulcerative colitis, unspecified   . Depression   . Hypogonadism male     No past surgical history on file.  Family History  Problem Relation Age of Onset  . COPD Father   . Lung cancer      History  Substance Use Topics  . Smoking status: Never Smoker   . Smokeless tobacco: Not on file  . Alcohol Use: No      Review of Systems  Constitutional: Negative for fever, chills, diaphoresis, activity change, appetite change and fatigue.  HENT: Negative for congestion, sore throat, facial swelling, rhinorrhea, drooling, neck pain and voice change.   Respiratory: Negative for cough, shortness of breath and stridor.   Cardiovascular: Negative for chest pain.  Gastrointestinal: Negative for nausea, vomiting, abdominal pain, diarrhea and abdominal distention.  Endocrine: Negative for polydipsia and polyuria.  Genitourinary: Negative for dysuria, urgency, frequency and decreased urine  volume.  Musculoskeletal: Negative for back pain and gait problem.  Skin: Positive for wound. Negative for color change.  Neurological: Negative for facial asymmetry, weakness, numbness and headaches.  Hematological: Does not bruise/bleed easily.  Psychiatric/Behavioral: Negative for confusion and agitation.    Allergies  Metronidazole  Home Medications   Current Outpatient Rx  Name  Route  Sig  Dispense  Refill  . amoxicillin-clavulanate (AUGMENTIN) 875-125 MG per tablet   Oral   Take 1 tablet by mouth 2 (two) times daily.   20 tablet   0   . citalopram (CELEXA) 20 MG tablet   Oral   Take 1 tablet (20 mg total) by mouth daily.   30 tablet   5   . citalopram (CELEXA) 20 MG tablet      TAKE 1 TABLET BY MOUTH EVERY DAY   30 tablet   5   . Inositol Niacinate (NIACIN FLUSH FREE) 500 MG CAPS   Oral   Take by mouth 46 HOURS.           Marland Kitchen testosterone cypionate (DEPOTESTOTERONE CYPIONATE) 200 MG/ML injection      INJECT 1 ML EVERY MONTH   10 mL   0     BP 138/90  Pulse 60  Resp 18  SpO2 95%  Physical Exam  Constitutional: He is oriented to person, place, and time. He appears well-developed and well-nourished. No distress.  HENT:  Head: Normocephalic. Head is with laceration.    Nose: No rhinorrhea, nose lacerations, nasal deformity, septal deviation or nasal septal hematoma. Epistaxis  is observed. Left sinus exhibits maxillary sinus tenderness.  Mouth/Throat: No oropharyngeal exudate.    Eyes: Pupils are equal, round, and reactive to light.  Neck: Normal range of motion. Neck supple.  Cardiovascular: Normal rate, regular rhythm and normal heart sounds.  Exam reveals no gallop and no friction rub.   No murmur heard. Pulmonary/Chest: Effort normal and breath sounds normal. No respiratory distress. He has no wheezes. He has no rales.  Abdominal: Soft. Bowel sounds are normal. He exhibits no distension and no mass. There is no tenderness. There is no rebound and  no guarding.  Musculoskeletal: Normal range of motion. He exhibits no edema and no tenderness.       Back:  Neurological: He is alert and oriented to person, place, and time.  Skin: Skin is warm and dry.  Psychiatric: He has a normal mood and affect.    ED Course  EPISTAXIS MANAGEMENT Date/Time: 05/10/2012 1:30 PM Performed by: Ernestina Patches Authorized by: Ernestina Patches Consent: Verbal consent obtained. Risks and benefits: risks, benefits and alternatives were discussed Consent given by: patient Patient understanding: patient states understanding of the procedure being performed Patient consent: the patient's understanding of the procedure matches consent given Procedure consent: procedure consent matches procedure scheduled Relevant documents: relevant documents present and verified Test results: test results available and properly labeled Site marked: the operative site was marked Imaging studies: imaging studies available Required items: required blood products, implants, devices, and special equipment available Patient identity confirmed: verbally with patient Patient sedated: no Treatment site: left posterior and right posterior Repair method: nasal balloon Post-procedure assessment: bleeding stopped Treatment complexity: simple Patient tolerance: Patient tolerated the procedure well with no immediate complications.   (including critical care time)  Labs Reviewed  CBC WITH DIFFERENTIAL - Abnormal; Notable for the following:    MCHC 36.1 (*)    All other components within normal limits  COMPREHENSIVE METABOLIC PANEL - Abnormal; Notable for the following:    Glucose, Bld 135 (*)    All other components within normal limits  PROTIME-INR  TYPE AND SCREEN  ABO/RH   Dg Chest 2 View  05/09/2012  *RADIOLOGY REPORT*  Clinical Data: Injury, pain.  CHEST - 2 VIEW  Comparison: None.  Findings: Lung volumes are low but the lungs are clear.  Heart size is normal.  No  pneumothorax or pleural effusion.  No focal bony abnormality.  IMPRESSION: No acute disease.   Original Report Authenticated By: Orlean Patten, M.D.    Ct Head Wo Contrast  05/09/2012  *RADIOLOGY REPORT*  Clinical Data:  Hit in face with tree  CT HEAD WITHOUT CONTRAST CT MAXILLOFACIAL WITHOUT CONTRAST CT CERVICAL SPINE WITHOUT CONTRAST  Technique:  Multidetector CT imaging of the head, cervical spine, and maxillofacial structures were performed using the standard protocol without intravenous contrast. Multiplanar CT image reconstructions of the cervical spine and maxillofacial structures were also generated.  Comparison:  Concurrently obtained CT scan of the head, face and cervical spine  CT HEAD  Findings: No acute intracranial hemorrhage, acute infarction, mass lesion, mass effect, midline shift or hydrocephalus.  Gray-white differentiation is preserved throughout.  The globes and orbits are intact.  No focal scalp hematoma.  See below for facial fracture findings.  Small amount of air can be identified in the bilateral cavernous sinuses as well as tracking along the interstitium of the petrous portion of the internal carotid arteries.  IMPRESSION:  1.  No acute intracranial abnormality. 2.  Extensive facial fractures with subcutaneous emphysema  tracking along the interstitium the bilateral petrous carotid arteries and also into the cavernous sinuses.  See below CT scan of the face for further detail.  CT MAXILLOFACIAL  Findings: Nondisplaced mandibular fracture.  There is a fracture line extending through the anterior right mandible passing between the roots of the first and second premolar.  Additionally, there is a nondisplaced fracture through the left mandibular ramus. Extensive complex fracture of the left maxillary sinus involving all walls and extending through the medial and lateral pterygoid plates. There is a longitudinal fracture plane extending from the knees seen on into the hard palate which is  mildly displaced. Additional fractures involving the right maxillary alveolar ridge extending up into the maxillary sinus.  All for walls of the right maxillary sinus are also fracture, however not has significantly as on the left.  The right medial and lateral pterygoid plates are also involved. Nondisplaced fractures through the left zygomatic arch and lateral wall of the left orbit extensive venous sinus and air throughout the sinuses and soft tissues at the base of the skull. Fractures the bilateral nasal bones and left frontal process of the maxilla.  IMPRESSION:  1. Extensive mid facial smash injury extending into the hard palate and right maxillary alveolar ridge with additional fractures of the left zygomatic arch and left lateral orbital wall.  2. Nondisplaced mandibular fracture involving the anterior right aspect of the mandible and the left ramus.  CT CERVICAL SPINE  Findings: No acute fracture, malalignment or soft tissue swelling. Normal bony mineralization.  No focal degenerative changes.  There is mild cervical spondylosis without focality.  Slight straightening of the normal cervical lordosis, likely positional or related to muscle spasm.  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  IMPRESSION: No acute fracture or malalignment.  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   Original Report Authenticated By: Jacqulynn Cadet, M.D.    Ct Cervical Spine Wo Contrast  05/09/2012  *RADIOLOGY REPORT*  Clinical Data:  Hit in face with tree  CT HEAD WITHOUT CONTRAST CT MAXILLOFACIAL WITHOUT CONTRAST CT CERVICAL SPINE WITHOUT CONTRAST  Technique:  Multidetector CT imaging of the head, cervical spine, and maxillofacial structures were performed using the standard protocol without intravenous contrast. Multiplanar CT image reconstructions of the cervical spine and maxillofacial structures were also generated.  Comparison:  Concurrently obtained CT scan of  the head, face and cervical spine  CT HEAD  Findings: No acute intracranial hemorrhage, acute infarction, mass lesion, mass effect, midline shift or hydrocephalus.  Gray-white differentiation is preserved throughout.  The globes and orbits are intact.  No focal scalp hematoma.  See below for facial fracture findings.  Small amount of air can be identified in the bilateral cavernous sinuses as well as tracking along the interstitium of the petrous portion of the internal carotid arteries.  IMPRESSION:  1.  No acute intracranial abnormality. 2.  Extensive facial fractures with subcutaneous emphysema tracking along the interstitium the bilateral petrous carotid arteries and also into the cavernous sinuses.  See below CT scan of the face for further detail.  CT MAXILLOFACIAL  Findings: Nondisplaced mandibular fracture.  There is a fracture line extending through the anterior right mandible passing between the roots of the first and second premolar.  Additionally, there is a nondisplaced fracture through the left mandibular ramus. Extensive complex fracture of the left maxillary sinus involving all walls and extending through the medial and lateral pterygoid plates. There is a longitudinal fracture  plane extending from the knees seen on into the hard palate which is mildly displaced. Additional fractures involving the right maxillary alveolar ridge extending up into the maxillary sinus.  All for walls of the right maxillary sinus are also fracture, however not has significantly as on the left.  The right medial and lateral pterygoid plates are also involved. Nondisplaced fractures through the left zygomatic arch and lateral wall of the left orbit extensive venous sinus and air throughout the sinuses and soft tissues at the base of the skull. Fractures the bilateral nasal bones and left frontal process of the maxilla.  IMPRESSION:  1. Extensive mid facial smash injury extending into the hard palate and right maxillary  alveolar ridge with additional fractures of the left zygomatic arch and left lateral orbital wall.  2. Nondisplaced mandibular fracture involving the anterior right aspect of the mandible and the left ramus.  CT CERVICAL SPINE  Findings: No acute fracture, malalignment or soft tissue swelling. Normal bony mineralization.  No focal degenerative changes.  There is mild cervical spondylosis without focality.  Slight straightening of the normal cervical lordosis, likely positional or related to muscle spasm.  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  IMPRESSION: No acute fracture or malalignment.  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   Original Report Authenticated By: Jacqulynn Cadet, M.D.    Ct Maxillofacial Wo Cm  05/09/2012  *RADIOLOGY REPORT*  Clinical Data:  Hit in face with tree  CT HEAD WITHOUT CONTRAST CT MAXILLOFACIAL WITHOUT CONTRAST CT CERVICAL SPINE WITHOUT CONTRAST  Technique:  Multidetector CT imaging of the head, cervical spine, and maxillofacial structures were performed using the standard protocol without intravenous contrast. Multiplanar CT image reconstructions of the cervical spine and maxillofacial structures were also generated.  Comparison:  Concurrently obtained CT scan of the head, face and cervical spine  CT HEAD  Findings: No acute intracranial hemorrhage, acute infarction, mass lesion, mass effect, midline shift or hydrocephalus.  Gray-white differentiation is preserved throughout.  The globes and orbits are intact.  No focal scalp hematoma.  See below for facial fracture findings.  Small amount of air can be identified in the bilateral cavernous sinuses as well as tracking along the interstitium of the petrous portion of the internal carotid arteries.  IMPRESSION:  1.  No acute intracranial abnormality. 2.  Extensive facial fractures with subcutaneous emphysema tracking along the interstitium the bilateral petrous carotid  arteries and also into the cavernous sinuses.  See below CT scan of the face for further detail.  CT MAXILLOFACIAL  Findings: Nondisplaced mandibular fracture.  There is a fracture line extending through the anterior right mandible passing between the roots of the first and second premolar.  Additionally, there is a nondisplaced fracture through the left mandibular ramus. Extensive complex fracture of the left maxillary sinus involving all walls and extending through the medial and lateral pterygoid plates. There is a longitudinal fracture plane extending from the knees seen on into the hard palate which is mildly displaced. Additional fractures involving the right maxillary alveolar ridge extending up into the maxillary sinus.  All for walls of the right maxillary sinus are also fracture, however not has significantly as on the left.  The right medial and lateral pterygoid plates are also involved. Nondisplaced fractures through the left zygomatic arch and lateral wall of the left orbit extensive venous sinus and air throughout the sinuses and soft tissues at the base of the skull. Fractures the bilateral  nasal bones and left frontal process of the maxilla.  IMPRESSION:  1. Extensive mid facial smash injury extending into the hard palate and right maxillary alveolar ridge with additional fractures of the left zygomatic arch and left lateral orbital wall.  2. Nondisplaced mandibular fracture involving the anterior right aspect of the mandible and the left ramus.  CT CERVICAL SPINE  Findings: No acute fracture, malalignment or soft tissue swelling. Normal bony mineralization.  No focal degenerative changes.  There is mild cervical spondylosis without focality.  Slight straightening of the normal cervical lordosis, likely positional or related to muscle spasm.  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  IMPRESSION: No acute fracture or malalignment.  Congenitally incomplete fusion  of the posterior arch of C1.   Original Report Authenticated By: Jacqulynn Cadet, M.D.      1. Accidentally struck by tree, initial encounter   2. Multiple facial bone fractures, open, initial encounter   3. Nasal bone fracture, closed, initial encounter   4. Fracture of maxillary sinus, closed, initial encounter   5. Mandible fracture, open, initial encounter   6. Fracture of hard palate, open, initial encounter   7. Orbit fracture, left, closed, initial encounter   8. Open fracture of alveolar ridge of maxilla, initial encounter   9. Chin laceration, initial encounter   10. Left-sided epistaxis   11. Right-sided epistaxis       MDM  Pt is a 47 y.o. male with pertinent PMHX of UC who presents with trauma to lower face after large tree branch fell on him approx 20 mins ago.  Pt was wearing helmet, branch landed across his mouth, knocking him on to a deck.  No LOC, no headache, neck pain, chest pain, ab pain, extremity pain.  Pt has no ttp and no septal hematoma, but has moderate bleeding from nose he was unable to control pta.  Pt also had L TMJ pain, as well as pain of medial L maxilla, and central maxilla.  She has a through and through irregular laceration laceration involving lower lip/chin.  Teeth 24, 25, 26 appear partially avulsed with likely fracture at alveolar crest.  CT head, face, neck, ordered as well as CXR, CBC, BMP, INR.  Epistaxis controlled after afrin & AP nasal packing inserted.    Epistaxis reoccured, BL packing placed with improvement.   6:38 PM Pt has multiple complex facial fractures on CT.  I have spoken w/ Dr. Wilburn Cornelia w/ maxillofacial surgery who will take to OR.  2g IV ancef given. Epistaxis controlled.  Pt NPO since about 9am.   1. Accidentally struck by tree, initial encounter   2. Multiple facial bone fractures, open, initial encounter   3. Nasal bone fracture, closed, initial encounter   4. Fracture of maxillary sinus, closed, initial encounter   5.  Mandible fracture, open, initial encounter   6. Fracture of hard palate, open, initial encounter   7. Orbit fracture, left, closed, initial encounter   8. Open fracture of alveolar ridge of maxilla, initial encounter   9. Chin laceration, initial encounter   10. Left-sided epistaxis   11. Right-sided epistaxis      Labs and imaging considered in decision making, reviewed by myself.  Imaging interpreted by radiology. Pt care discussed with my attending, Dr. Wyvonnia Dusky.    Ernestina Patches, MD 05/09/12 Bosie Helper   Ernestina Patches, MD 05/10/12 [phone]

## 2012-05-09 NOTE — ED Notes (Signed)
Pt resting quietly at the time. Vital signs stable. Packing remains in both nostrils, bleeding controlled at the time. Family at bedside. Pt remains alert and oriented x4.

## 2012-05-09 NOTE — Anesthesia Procedure Notes (Signed)
Procedure Name: Intubation Date/Time: 05/09/2012 8:08 PM Performed by: Mosie Epstein Pre-anesthesia Checklist: Patient identified, Timeout performed, Emergency Drugs available, Suction available and Patient being monitored Patient Re-evaluated:Patient Re-evaluated prior to inductionOxygen Delivery Method: Circle system utilized Preoxygenation: Pre-oxygenation with 100% oxygen Intubation Type: Rapid sequence Laryngoscope size: glidescope. Tube type: Oral Tube size: 7.5 mm Number of attempts: 1 Airway Equipment and Method: Stylet and Video-laryngoscopy Placement Confirmation: ETT inserted through vocal cords under direct vision,  positive ETCO2 and breath sounds checked- equal and bilateral Secured at: 23 cm Tube secured with: Tape Dental Injury: Teeth and Oropharynx as per pre-operative assessment  Comments: Glidescope used due to injuries
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
## 2012-05-09 NOTE — Preoperative (Signed)
Beta Blockers   Reason not to administer Beta Blockers:Not Applicable 

## 2012-05-10 LAB — MRSA PCR SCREENING: MRSA by PCR: NEGATIVE

## 2012-05-10 MED ORDER — KCL IN DEXTROSE-NACL 20-5-0.45 MEQ/L-%-% IV SOLN
INTRAVENOUS | Status: DC
  Administered 2012-05-10: 100 mL/h via INTRAVENOUS
  Administered 2012-05-11 – 2012-05-13 (×5): via INTRAVENOUS
  Filled 2012-05-10 (×18): qty 1000

## 2012-05-10 MED ORDER — HEPARIN SODIUM (PORCINE) 5000 UNIT/ML IJ SOLN
5000.0000 [IU] | Freq: Three times a day (TID) | INTRAMUSCULAR | Status: DC
  Administered 2012-05-10 – 2012-05-16 (×18): 5000 [IU] via SUBCUTANEOUS
  Filled 2012-05-10 (×22): qty 1

## 2012-05-10 MED ORDER — CEFAZOLIN SODIUM-DEXTROSE 2-3 GM-% IV SOLR
2.0000 g | Freq: Three times a day (TID) | INTRAVENOUS | Status: DC
  Administered 2012-05-10 – 2012-05-12 (×7): 2 g via INTRAVENOUS
  Filled 2012-05-10 (×10): qty 50

## 2012-05-10 MED ORDER — CHLORHEXIDINE GLUCONATE 0.12 % MT SOLN
15.0000 mL | Freq: Two times a day (BID) | OROMUCOSAL | Status: DC
  Administered 2012-05-11 – 2012-05-15 (×10): 15 mL via OROMUCOSAL
  Filled 2012-05-10 (×11): qty 15

## 2012-05-10 MED ORDER — DEXAMETHASONE SODIUM PHOSPHATE 10 MG/ML IJ SOLN
10.0000 mg | Freq: Two times a day (BID) | INTRAMUSCULAR | Status: AC
  Administered 2012-05-10 – 2012-05-12 (×5): 10 mg via INTRAVENOUS
  Filled 2012-05-10 (×5): qty 1

## 2012-05-10 MED ORDER — BACITRACIN ZINC 500 UNIT/GM EX OINT
TOPICAL_OINTMENT | CUTANEOUS | Status: DC | PRN
  Administered 2012-05-10: 1 via TOPICAL

## 2012-05-10 MED ORDER — MORPHINE SULFATE 2 MG/ML IJ SOLN
2.0000 mg | INTRAMUSCULAR | Status: DC | PRN
  Administered 2012-05-10 (×2): 4 mg via INTRAVENOUS
  Administered 2012-05-10 – 2012-05-11 (×7): 2 mg via INTRAVENOUS
  Filled 2012-05-10: qty 2
  Filled 2012-05-10 (×2): qty 1
  Filled 2012-05-10 (×2): qty 2
  Filled 2012-05-10 (×5): qty 1
  Filled 2012-05-10: qty 2

## 2012-05-10 MED ORDER — OXYCODONE HCL 5 MG/5ML PO SOLN: 5.0000 mg | Freq: Once | ORAL | Status: DC | PRN

## 2012-05-10 MED ORDER — CEFAZOLIN SODIUM 1-5 GM-% IV SOLN
INTRAVENOUS | Status: DC | PRN
  Administered 2012-05-10 (×2): 1 g via INTRAVENOUS

## 2012-05-10 MED ORDER — MIDAZOLAM HCL 2 MG/2ML IJ SOLN: 0.5000 mg | Freq: Once | INTRAMUSCULAR | Status: DC | PRN

## 2012-05-10 MED ORDER — MEPERIDINE HCL 25 MG/ML IJ SOLN: 6.2500 mg | INTRAMUSCULAR | Status: DC | PRN

## 2012-05-10 MED ORDER — BACITRACIN ZINC 500 UNIT/GM EX OINT
1.0000 "application " | TOPICAL_OINTMENT | Freq: Three times a day (TID) | CUTANEOUS | Status: DC
  Administered 2012-05-10 – 2012-05-16 (×19): 1 via TOPICAL
  Filled 2012-05-10: qty 15

## 2012-05-10 MED ORDER — GLYCOPYRROLATE 0.2 MG/ML IJ SOLN
INTRAMUSCULAR | Status: DC | PRN
  Administered 2012-05-10: .6 mg via INTRAVENOUS

## 2012-05-10 MED ORDER — PROMETHAZINE HCL 25 MG/ML IJ SOLN: 6.2500 mg | INTRAMUSCULAR | Status: DC | PRN

## 2012-05-10 MED ORDER — ONDANSETRON HCL 4 MG/2ML IJ SOLN
4.0000 mg | INTRAMUSCULAR | Status: DC | PRN
  Administered 2012-05-10 (×2): 4 mg via INTRAVENOUS
  Filled 2012-05-10 (×6): qty 2

## 2012-05-10 MED ORDER — ONDANSETRON HCL 4 MG PO TABS: 4.0000 mg | ORAL_TABLET | ORAL | Status: DC | PRN

## 2012-05-10 MED ORDER — HYDROMORPHONE HCL PF 1 MG/ML IJ SOLN
INTRAMUSCULAR | Status: AC
  Administered 2012-05-10: 0.5 mg via INTRAVENOUS
  Filled 2012-05-10: qty 1

## 2012-05-10 MED ORDER — CHLORHEXIDINE GLUCONATE 0.12 % MT SOLN
OROMUCOSAL | Status: AC
  Administered 2012-05-10: 15 mL
  Filled 2012-05-10: qty 15

## 2012-05-10 MED ORDER — IBUPROFEN 100 MG/5ML PO SUSP
400.0000 mg | Freq: Four times a day (QID) | ORAL | Status: DC | PRN
  Administered 2012-05-16: 400 mg via ORAL
  Filled 2012-05-10 (×2): qty 20

## 2012-05-10 MED ORDER — HYDROMORPHONE HCL PF 1 MG/ML IJ SOLN
0.2500 mg | INTRAMUSCULAR | Status: DC | PRN
  Administered 2012-05-10: 0.5 mg via INTRAVENOUS

## 2012-05-10 MED ORDER — DEXTROSE 5 % IV SOLN
INTRAVENOUS | Status: DC | PRN
  Administered 2012-05-10: via INTRAVENOUS

## 2012-05-10 MED ORDER — NEOSTIGMINE METHYLSULFATE 1 MG/ML IJ SOLN
INTRAMUSCULAR | Status: DC | PRN
  Administered 2012-05-10: 4.5 mg via INTRAVENOUS

## 2012-05-10 MED ORDER — OXYCODONE HCL 5 MG PO TABS: 5.0000 mg | ORAL_TABLET | Freq: Once | ORAL | Status: DC | PRN

## 2012-05-10 MED ORDER — PROMETHAZINE HCL 25 MG/ML IJ SOLN
12.5000 mg | INTRAMUSCULAR | Status: DC | PRN
  Administered 2012-05-10 – 2012-05-11 (×5): 12.5 mg via INTRAVENOUS
  Filled 2012-05-10 (×6): qty 1

## 2012-05-10 NOTE — Anesthesia Postprocedure Evaluation (Signed)
  Anesthesia Post-op Note  Patient: [name]  Procedure(s) Performed: Procedure(s): OPEN REDUCTION INTERNAL FIXATION (ORIF) RIGHT ANTERIOR MANDIBULAR FRACTURE; Open Reduction Internal Fixation of Midface fracture; Reduction of septal fracture; arch bars; Repair of lacerations (Left) TRACHEOSTOMY (N/A)  Patient Location: PACU  Anesthesia Type:General  Level of Consciousness: sedated, patient cooperative and responds to stimulation  Airway and Oxygen Therapy: Patient Spontanous Breathing and Patient connected to T-piece oxygen (trach)  Post-op Pain: mild  Post-op Assessment: Post-op Vital signs reviewed, Patient's Cardiovascular Status Stable, Respiratory Function Stable, Patent Airway, No signs of Nausea or vomiting and Pain level controlled  Post-op Vital Signs: Reviewed and stable  Complications: No apparent anesthesia complications

## 2012-05-10 NOTE — Brief Op Note (Signed)
05/09/2012 - 05/10/2012  1:03 AM  PATIENT:  [name]  47 y.o. male  PRE-OPERATIVE DIAGNOSIS:  left facial fractures; right mandible fracture; complex lower lip laceration  POST-OPERATIVE DIAGNOSIS:  left facial fractures; right mandible fracture; complex lower lip   PROCEDURE:  Procedure(s): OPEN REDUCTION INTERNAL FIXATION (ORIF) RIGHT ANTERIOR MANDIBULAR FRACTURE; Open Reduction Internal Fixation of Midface fracture; Reduction of septal fracture; arch bars; Repair of lacerations (Left) TRACHEOSTOMY (N/A)  SURGEON:  Surgeon(s) and Role:    * Jerrell Belfast, MD - Primary  PHYSICIAN ASSISTANT:   ASSISTANTS: none   ANESTHESIA:   general  EBL:  Total I/O In: 3100 [I.V.:2600; IV Piggyback:500] Out: 600 [Urine:300; Other:250; Blood:50]  BLOOD ADMINISTERED:none  DRAINS: Urinary Catheter (Foley)   LOCAL MEDICATIONS USED:  LIDOCAINE  and Amount: 10 ml  SPECIMEN:  No Specimen  DISPOSITION OF SPECIMEN:  N/A  COUNTS:  YES  TOURNIQUET:  * No tourniquets in log *  DICTATION: .Other Dictation: Dictation Number [id number]  PLAN OF CARE: Admit to inpatient   PATIENT DISPOSITION:  PACU - hemodynamically stable.   Delay start of Pharmacological VTE agent (>24hrs) due to surgical blood loss or risk of bleeding: no
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
## 2012-05-10 NOTE — OR Nursing (Signed)
Obturator and Wire scissors taped to bed post op.

## 2012-05-10 NOTE — Transfer of Care (Signed)
Immediate Anesthesia Transfer of Care Note  Patient: [name]  Procedure(s) Performed: Procedure(s): OPEN REDUCTION INTERNAL FIXATION (ORIF) RIGHT ANTERIOR MANDIBULAR FRACTURE; Open Reduction Internal Fixation of Midface fracture; Reduction of septal fracture; arch bars; Repair of lacerations (Left) TRACHEOSTOMY (N/A)  Patient Location: PACU  Anesthesia Type:General  Level of Consciousness: awake, alert  and oriented  Airway & Oxygen Therapy: Patient Spontanous Breathing and Patient connected to tracheostomy mask oxygen  Post-op Assessment: Report given to PACU RN and Post -op Vital signs reviewed and stable  Post vital signs: Reviewed and stable  Complications: No apparent anesthesia complications

## 2012-05-11 ENCOUNTER — Encounter (HOSPITAL_COMMUNITY): Payer: Self-pay | Admitting: Otolaryngology

## 2012-05-11 LAB — CBC
MCH: 30.7 pg (ref 26.0–34.0)
MCV: 87.6 fL (ref 78.0–100.0)
Platelets: 206 10*3/uL (ref 150–400)
RDW: 13.9 % (ref 11.5–15.5)
WBC: 15.5 10*3/uL — ABNORMAL HIGH (ref 4.0–10.5)

## 2012-05-11 LAB — BASIC METABOLIC PANEL
Calcium: 9.7 mg/dL (ref 8.4–10.5)
Creatinine, Ser: 0.78 mg/dL (ref 0.50–1.35)
GFR calc Af Amer: >90 mL/min (ref >90)
Sodium: 137 mEq/L (ref 135–145)

## 2012-05-11 MED ORDER — SALINE SPRAY 0.65 % NA SOLN
4.0000 | Freq: Three times a day (TID) | NASAL | Status: DC | PRN
  Filled 2012-05-11: qty 44

## 2012-05-11 MED FILL — Hydromorphone HCl Inj 1 MG/ML: INTRAMUSCULAR | Qty: 1 | Status: AC

## 2012-05-11 NOTE — Progress Notes (Signed)
Trach Team Note  Received Trach team consult on this pt. Trach placed early this am. If pt does not need ventilator support, consider PMSV order when medically ready. Thanks, Herbie [place], [place] CCC-SLP [phone]

## 2012-05-11 NOTE — Significant Event (Signed)
1840pm-transferred patient to [employer], traveled via wheelchair. On room air. VS stable prior and during the transfer. Pt settled in chair in [place], call bell, [place] within reach, staff in room, pt spouse at bedside. All patient belongings (black cellphone and eye-glasses) are with patient. Report given to receiving RN Blanch Media. Hyiu Ksor, Therapist, sports.

## 2012-05-11 NOTE — Progress Notes (Signed)
Utilization Review Completed. 05/11/2012

## 2012-05-11 NOTE — Progress Notes (Signed)
Pt states that he would prefer not to wear ATC tonight. Pt stable on RA with strong, non-productive cough for now.
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
## 2012-05-12 ENCOUNTER — Inpatient Hospital Stay (HOSPITAL_COMMUNITY): Payer: Managed Care, Other (non HMO)

## 2012-05-12 MED ORDER — ENSURE COMPLETE PO LIQD
237.0000 mL | Freq: Every day | ORAL | Status: DC
  Administered 2012-05-12 – 2012-05-14 (×9): 237 mL via ORAL

## 2012-05-12 MED ORDER — PRO-STAT SUGAR FREE PO LIQD
30.0000 mL | Freq: Three times a day (TID) | ORAL | Status: DC
  Administered 2012-05-12 – 2012-05-15 (×10): 30 mL via ORAL
  Filled 2012-05-12 (×15): qty 30

## 2012-05-12 MED ORDER — HYDROCODONE-ACETAMINOPHEN 7.5-325 MG/15ML PO SOLN
10.0000 mL | ORAL | Status: DC | PRN
Start: 2012-05-12 — End: 2012-05-16
  Administered 2012-05-12 – 2012-05-13 (×3): 10 mL via ORAL
  Administered 2012-05-15: 15 mL via ORAL
  Administered 2012-05-15 (×3): 10 mL via ORAL
  Filled 2012-05-12 (×4): qty 15
  Filled 2012-05-12: qty 30
  Filled 2012-05-12 (×3): qty 15

## 2012-05-12 MED ORDER — AMOXICILLIN-POT CLAVULANATE 400-57 MG/5ML PO SUSR
10.0000 mL | Freq: Two times a day (BID) | ORAL | Status: DC
  Administered 2012-05-12 – 2012-05-15 (×8): 10 mL via ORAL
  Filled 2012-05-12 (×11): qty 10

## 2012-05-12 NOTE — Progress Notes (Signed)
   ENT Progress Note: POD #3  s/p Procedure(s): OPEN REDUCTION INTERNAL FIXATION (ORIF) RIGHT ANTERIOR MANDIBULAR FRACTURE; Open Reduction Internal Fixation of Midface fracture; Reduction of septal fracture; arch bars; Repair of lacerations TRACHEOSTOMY   Subjective: Pt c/o congestion Mild pain  Objective: Vital signs in last 24 hours: Temp:  [97.9 F (36.6 C)-99 F (37.2 C)] 98.2 F (36.8 C) (03/11 0501) Pulse Rate:  [68-94] 88 (03/11 0853) Resp:  [12-21] 18 (03/11 0853) BP: (113-139)/(65-90) 113/68 mmHg (03/11 0501) SpO2:  [92 %-100 %] 98 % (03/11 0853) FiO2 (%):  [28 %] 28 % (03/10 2136) Weight change:  Last BM Date: 05/11/12  Intake/Output from previous day: 03/10 0701 - 03/11 0700 In: 2357 [P.O.:360; I.V.:1947; IV Piggyback:50] Out: 1160 [Urine:1160] Intake/Output this shift: Total I/O In: 240 [P.O.:240] Out: -   Labs:  Recent Labs  05/09/12 1529 05/11/12 0610  WBC 8.5 15.5*  HGB 15.9 13.4  HCT 44.1 38.3*  PLT 267 206    Recent Labs  05/09/12 1529 05/11/12 0610  NA 137 137  K 3.7 4.3  CL 99 100  CO2 24 31  GLUCOSE 135* 140*  BUN 16 14  CALCIUM 10.1 9.7    Studies/Results: Panorex shows adequate reduction  PHYSICAL EXAM: Facial swelling, no erythema MMF intact Trach intact   Assessment/Plan: Pt stable, plan downsize trach 3/12, consider d/c 3/14 if cont improvement Adv to full liquid diet Cont wound care and abx    SHOEMAKER, DAVID 05/12/2012, 11:32 AM

## 2012-05-13 MED ORDER — CITALOPRAM HYDROBROMIDE 10 MG/5ML PO SOLN
20.0000 mg | Freq: Every day | ORAL | Status: DC
  Administered 2012-05-13 – 2012-05-15 (×3): 20 mg via ORAL
  Filled 2012-05-13 (×4): qty 10

## 2012-05-13 NOTE — Progress Notes (Signed)
A11 sent down for #4SH cuffed trach for pts bedside

## 2012-05-13 NOTE — Progress Notes (Signed)
MD changed trach #6SH to #4SH CFS

## 2012-05-13 NOTE — Progress Notes (Signed)
   ENT Progress Note: POD #4 s/p Procedure(s): OPEN REDUCTION INTERNAL FIXATION (ORIF) RIGHT ANTERIOR MANDIBULAR FRACTURE; Open Reduction Internal Fixation of Midface fracture; Reduction of septal fracture; arch bars; Repair of lacerations TRACHEOSTOMY   Subjective: Min pain, decreased swelling, c/o anxiety and sleep issues  Objective: Vital signs in last 24 hours: Temp:  [97.9 F (36.6 C)-98.3 F (36.8 C)] 97.9 F (36.6 C) (03/12 0600) Pulse Rate:  [64-95] 82 (03/12 1232) Resp:  [16-18] 16 (03/12 1232) BP: (127-138)/(69-82) 127/69 mmHg (03/12 0600) SpO2:  [97 %-100 %] 98 % (03/12 1232) FiO2 (%):  [28 %] 28 % (03/12 1232) Weight change:  Last BM Date: 05/09/12  Intake/Output from previous day: 03/11 0701 - 03/12 0700 In: 3068.3 [P.O.:600; I.V.:2468.3] Out: 400 [Urine:400] Intake/Output this shift: Total I/O In: 240 [P.O.:240] Out: 300 [Urine:300]  Labs:  Recent Labs  05/11/12 0610  WBC 15.5*  HGB 13.4  HCT 38.3*  PLT 206    Recent Labs  05/11/12 0610  NA 137  K 4.3  CL 100  CO2 31  GLUCOSE 140*  BUN 14  CALCIUM 9.7    Studies/Results: Dg Orthopantogram  05/12/2012  *RADIOLOGY REPORT*  Clinical Data: Fixation of mandibular fracture  ORTHOPANTOGRAM/PANORAMIC  Comparison: 05/09/2012 CT  Findings: Due to typical midline device motion artifact, the previously seen right anterolateral mandibular fracture is not visualized.  Left mandibular ramus fracture is reidentified.  There has been placement of fixation hardware spanning the dentition. Linear metallic device overlying the midline of the mandible is noted, likely related to fixation but not well evaluated due to midline motion.  IMPRESSION: Reidentification of left mandibular ramus fracture, with interval apparent fixation hardware placement.  Dedicated dental imaging would be recommended for further evaluation as per maxillofacial surgery protocol, as the midline structures are typically not well defined at  orthopantogram.   Original Report Authenticated By: Conchita Paris, M.D.      PHYSICAL EXAM: Inc and MMF intact Decreased edema Downsized trach to #4 Cuffless   Assessment/Plan: Pt stable Downsize trach, plan cap and decan over several days Plan d/c 3/15    SHOEMAKER, DAVID 05/13/2012, 12:53 PM

## 2012-05-13 NOTE — Progress Notes (Signed)
Spoke with Dr. Simeon Craft regarding pt's telemetry orders. Dr. Simeon Craft placed orders to discontinue.

## 2012-05-14 MED ORDER — BOOST / RESOURCE BREEZE PO LIQD
1.0000 | Freq: Every day | ORAL | Status: DC
  Administered 2012-05-14 – 2012-05-16 (×6): 1 via ORAL

## 2012-05-14 NOTE — Progress Notes (Signed)
NUTRITION FOLLOW UP  Intervention:   1.  Modify diet; recommend clear liquids with pt allowed to have tomato soup, milk, and carnation instant breakfast 2.  Supplements; d/c Ensure, order Resource Breeze 6 times daily, each supplement provides 250 kcal, 9g protein.  Nutrition Dx:   Predicted suboptimal nutrient intake r/t to biting and chewing difficulty, ongoing due to omission of energy dense foods with inability to chew  Goal:   Oral intake with meals & supplements to meet >/= 90% of estimated nutrition needs   Monitor:  PO diet advancement & intake, weight, labs, I/O's   Assessment:   Pt admitted after being struck in the face while cutting a tree. Pt s/p ORIF R anterior mandible fx repair and trach placement.  Lurline Idol was downsized yesterday, plan for decannulation tomorrow. RD paged by RN due to pt with questions about current and home diet.  RD met with pt and discussed nutrition supplements to be used.  Pt reports Ensure caused excessive flatulence and prefers Breeze.  Discussed the availability of protein shakes and powders as outpatient.  Pt has also been limited to clear liquids as inpatient, but discussed ability for strained/blenderized soups as outpatient.  Discussed use of spoons and continued use of straw.  Discussed optimal consistency for meals at this time and ways to improve nutrient density of meals.  RD will d/c Ensure and make sure Gwyneth Revels is available for pt.   Height: Ht Readings from Last 1 Encounters:  05/10/12 5' 8"  (1.727 m)    Weight Status:   Wt Readings from Last 1 Encounters:  05/11/12 210 lb 15.7 oz (95.7 kg)    Re-estimated needs:  Kcal: 2200-2400 Protein: 115-125g Fluid: >2.2 L/day  Skin: intact  Diet Order: Clear Liquid   Intake/Output Summary (Last 24 hours) at 05/14/12 1550 Last data filed at 05/13/12 1700  Gross per 24 hour  Intake    360 ml  Output      0 ml  Net    360 ml    Last BM: 3/12   Labs:   Recent Labs Lab  05/09/12 1529 05/11/12 0610  NA 137 137  K 3.7 4.3  CL 99 100  CO2 24 31  BUN 16 14  CREATININE 0.94 0.78  CALCIUM 10.1 9.7  GLUCOSE 135* 140*    CBG (last 3)  No results found for this basename: GLUCAP,  in the last 72 hours  Scheduled Meds: . amoxicillin-clavulanate  10 mL Oral Q12H  . bacitracin  1 application Topical J1B  . chlorhexidine  15 mL Mouth/Throat BID  . citalopram  20 mg Oral Daily  . feeding supplement  237 mL Oral 6 X Daily  . feeding supplement  30 mL Oral TID  . heparin subcutaneous  5,000 Units Subcutaneous Q8H    Continuous Infusions: . dextrose 5 % and 0.45 % NaCl with KCl 20 mEq/L 100 mL/hr at 05/13/12 1009    Brynda Greathouse, MS RD LDN Clinical Inpatient Dietitian Pager: [phone] Weekend/After hours pager: [phone]

## 2012-05-14 NOTE — Progress Notes (Signed)
   ENT Progress Note: POD #5  s/p Procedure(s): OPEN REDUCTION INTERNAL FIXATION (ORIF) RIGHT ANTERIOR MANDIBULAR FRACTURE; Open Reduction Internal Fixation of Midface fracture; Reduction of septal fracture; arch bars; Repair of lacerations TRACHEOSTOMY   Subjective: Stable, no resp issues  Objective: Vital signs in last 24 hours: Temp:  [97.7 F (36.5 C)-98.2 F (36.8 C)] 97.7 F (36.5 C) (03/13 0550) Pulse Rate:  [58-98] 68 (03/13 0819) Resp:  [16-18] 18 (03/13 0819) BP: (123-132)/(66-89) 126/69 mmHg (03/13 0550) SpO2:  [94 %-99 %] 98 % (03/13 0819) FiO2 (%):  [28 %] 28 % (03/12 1232) Weight change:  Last BM Date: 05/13/12  Intake/Output from previous day: 03/12 0701 - 03/13 0700 In: 960 [P.O.:960] Out: 300 [Urine:300] Intake/Output this shift:    Labs: No results found for this basename: WBC, HGB, HCT, PLT,  in the last 72 hours No results found for this basename: NA, K, CL, CO2, GLUCOSE, BUN, CREATININR, CALCIUM,  in the last 72 hours  Studies/Results: Dg Orthopantogram  05/12/2012  *RADIOLOGY REPORT*  Clinical Data: Fixation of mandibular fracture  ORTHOPANTOGRAM/PANORAMIC  Comparison: 05/09/2012 CT  Findings: Due to typical midline device motion artifact, the previously seen right anterolateral mandibular fracture is not visualized.  Left mandibular ramus fracture is reidentified.  There has been placement of fixation hardware spanning the dentition. Linear metallic device overlying the midline of the mandible is noted, likely related to fixation but not well evaluated due to midline motion.  IMPRESSION: Reidentification of left mandibular ramus fracture, with interval apparent fixation hardware placement.  Dedicated dental imaging would be recommended for further evaluation as per maxillofacial surgery protocol, as the midline structures are typically not well defined at orthopantogram.   Original Report Authenticated By: Conchita Paris, M.D.      PHYSICAL  EXAM: Decreased swelling, MMF intact Trach capped   Assessment/Plan: Doing well Plan to decannulate trach 3/14 if capping tol Plan d/c 3/15    Deleon Passe 05/14/2012, 8:54 AM

## 2012-05-15 LAB — CBC
MCHC: 34.9 g/dL (ref 30.0–36.0)
Platelets: 237 10*3/uL (ref 150–400)
RDW: 13.9 % (ref 11.5–15.5)

## 2012-05-15 MED ORDER — CITALOPRAM HYDROBROMIDE 10 MG/5ML PO SOLN: 20.0000 mg | Freq: Every day | ORAL | Status: DC

## 2012-05-15 MED ORDER — AMOXICILLIN-POT CLAVULANATE 400-57 MG/5ML PO SUSR: 10.0000 mL | Freq: Two times a day (BID) | ORAL | Status: AC

## 2012-05-15 MED ORDER — PRO-STAT SUGAR FREE PO LIQD: 30.0000 mL | Freq: Three times a day (TID) | ORAL | Status: DC

## 2012-05-15 MED ORDER — HYDROCODONE-ACETAMINOPHEN 7.5-325 MG/15ML PO SOLN: 10.0000 mL | ORAL | Status: DC | PRN

## 2012-05-15 MED ORDER — BOOST / RESOURCE BREEZE PO LIQD: 1.0000 | Freq: Every day | ORAL | Status: DC

## 2012-05-15 MED ORDER — CHLORHEXIDINE GLUCONATE 0.12 % MT SOLN: 15.0000 mL | Freq: Two times a day (BID) | OROMUCOSAL | Status: DC

## 2012-05-15 NOTE — Progress Notes (Signed)
RT Note: Pt was decannulated by MD this afternoon & stoma covered with gauze. Tolerating well at this time, able to vocalize, RT to monitor.

## 2012-05-15 NOTE — Progress Notes (Signed)
   ENT Progress Note: POD #6  s/p Procedure(s): OPEN REDUCTION INTERNAL FIXATION (ORIF) RIGHT ANTERIOR MANDIBULAR FRACTURE; Open Reduction Internal Fixation of Midface fracture; Reduction of septal fracture; arch bars; Repair of lacerations TRACHEOSTOMY   Subjective: Stable  Objective: Vital signs in last 24 hours: Temp:  [97.4 F (36.3 C)-98 F (36.7 C)] 97.7 F (36.5 C) (03/14 0700) Pulse Rate:  [71-91] 77 (03/14 1125) Resp:  [16-18] 18 (03/14 1125) BP: (114-131)/(72-89) 114/81 mmHg (03/14 0700) SpO2:  [95 %-100 %] 95 % (03/14 1125) FiO2 (%):  [28 %] 28 % (03/13 1538) Weight change:  Last BM Date: 05/13/12  Intake/Output from previous day:   Intake/Output this shift:    Labs:  Recent Labs  05/15/12 0455  WBC 10.3  HGB 14.1  HCT 40.4  PLT 237   No results found for this basename: NA, K, CL, CO2, GLUCOSE, BUN, CREATININR, CALCIUM,  in the last 72 hours  Studies/Results: No results found.   PHYSICAL EXAM: Trach removed - stoma intact Lac/inc intact, no erythema or swelling   Assessment/Plan: Stable Pt decan today Plan d/c 3/15 if stable o/n. Plan f/u 3/24.    [place], DAVID 05/15/2012, 11:52 AM

## 2012-05-16 NOTE — Discharge Summary (Signed)
Physician Discharge Summary  Patient ID: [name] MRN: [id number] DOB/AGE: 1966/01/02 47 y.o.  Admit date: 05/09/2012 Discharge date: 05/16/2012  Admission Diagnoses:Facial trauma  Discharge Diagnoses:  Principal Problem:   Open fracture facial bones Active Problems:   Mandible open fracture   Nasal septal deformity   Facial laceration   Discharged Condition: good  Hospital Course: No complications  Consults: none  Significant Diagnostic Studies: none  Treatments: surgery: MMF/Trach  Discharge Exam: Blood pressure 127/79, pulse 88, temperature 97.5 F (36.4 C), temperature source Axillary, resp. rate 18, height 5' 8"  (1.727 m), weight 210 lb 15.7 oz (95.7 kg), SpO2 98.00%. PHYSICAL EXAM: Trach site dressed without leaking. MMF stable. No trouble breathing.  Disposition: Final discharge disposition not confirmed  Discharge Orders   Future Orders Complete By Expires     Diet - low sodium heart healthy  As directed     Discharge instructions  As directed     Comments:      1. Limited activity 2. Liquid diet only 3. May bathe and shower 4. Saline nasal spray - 4 puffs/nostril every hour while awake 5. Elevate Head of Bed 6. No nose blowing 7. Tracheostomy dressing care as directed 8. Wound care as instructed 9. Mouth rinse after meals    Increase activity slowly  As directed     Increase activity slowly  As directed         Medication List    STOP taking these medications       citalopram 20 MG tablet  Commonly known as:  CELEXA      TAKE these medications       amoxicillin-clavulanate 400-57 MG/5ML suspension  Commonly known as:  AUGMENTIN  Take 10 mLs by mouth every 12 (twelve) hours.     chlorhexidine 0.12 % solution  Commonly known as:  PERIDEX  Use as directed 15 mLs in the mouth or throat 2 (two) times daily.     citalopram 10 MG/5ML suspension  Commonly known as:  CELEXA  Take 10 mLs (20 mg total) by mouth daily.     feeding  supplement Liqd  Take 30 mLs by mouth 3 (three) times daily.     feeding supplement Liqd  Take 1 Container by mouth 6 (six) times daily.     HYDROcodone-acetaminophen 7.5-325 mg/15 ml solution  Commonly known as:  HYCET  Take 10 mLs by mouth every 4 (four) hours as needed for pain (pain).           Follow-up Information   Follow up with SHOEMAKER, DAVID, MD. Schedule an appointment as soon as possible for a visit on 05/25/2012.   Contact information:   [address] [place], [place] [address] [phone]       Signed: Izora Gala 05/16/2012, 7:39 AM

## 2012-05-16 NOTE — Progress Notes (Signed)
Pt discharged to home accomp by mother.  Wire cutters sent with pt and explained how to use if needed, pt understands the importance of carrying them with him everywhere he goes.  All Rx given and explained.  Instructed the pt on the importance of staying hydrated.  All discharge instructions reviewed and copy of instructions given to pt.  FU phone # for Dr. Wilburn Cornelia given and for pt to call on Monday for appt for 05/26/12.

## 2012-05-19 ENCOUNTER — Encounter: Payer: Self-pay | Admitting: Internal Medicine

## 2012-06-11 ENCOUNTER — Ambulatory Visit (HOSPITAL_COMMUNITY)
Admission: RE | Admit: 2012-06-11 | Discharge: 2012-06-11 | Disposition: A | Discharge status: Home / Self Care | Payer: Managed Care, Other (non HMO) | Source: Ambulatory Visit | Attending: Otolaryngology | Admitting: Otolaryngology

## 2012-06-11 ENCOUNTER — Other Ambulatory Visit (HOSPITAL_COMMUNITY): Payer: Self-pay | Admitting: Otolaryngology

## 2012-06-11 DIAGNOSIS — T148XXA Other injury of unspecified body region, initial encounter: Secondary | ICD-10-CM

## 2012-06-11 DIAGNOSIS — Z4789 Encounter for other orthopedic aftercare: Secondary | ICD-10-CM | POA: Insufficient documentation

## 2012-06-26 ENCOUNTER — Other Ambulatory Visit: Payer: Self-pay | Admitting: Otolaryngology

## 2012-06-29 NOTE — Addendum Note (Signed)
Addended by: Wilburn Cornelia, Denetria Luevanos on: 06/29/2012 08:25 AM   Modules accepted: Orders

## 2012-06-30 ENCOUNTER — Encounter (HOSPITAL_COMMUNITY): Payer: Self-pay

## 2012-07-01 ENCOUNTER — Other Ambulatory Visit: Payer: Self-pay | Admitting: Otolaryngology

## 2012-07-02 ENCOUNTER — Other Ambulatory Visit: Payer: Self-pay | Admitting: Internal Medicine

## 2012-07-03 ENCOUNTER — Encounter (HOSPITAL_COMMUNITY): Payer: Self-pay

## 2012-07-03 ENCOUNTER — Encounter (HOSPITAL_COMMUNITY)
Admission: RE | Admit: 2012-07-03 | Discharge: 2012-07-03 | Disposition: A | Discharge status: Home / Self Care | Payer: Managed Care, Other (non HMO) | Source: Ambulatory Visit | Attending: Otolaryngology | Admitting: Otolaryngology

## 2012-07-03 LAB — SURGICAL PCR SCREEN: MRSA, PCR: NEGATIVE

## 2012-07-03 LAB — BASIC METABOLIC PANEL
BUN: 10 mg/dL (ref 6–23)
Chloride: 101 mEq/L (ref 96–112)
GFR calc Af Amer: >90 mL/min (ref >90)
GFR calc non Af Amer: >90 mL/min (ref >90)
Potassium: 4.3 mEq/L (ref 3.5–5.1)
Sodium: 140 mEq/L (ref 135–145)

## 2012-07-03 LAB — CBC
HCT: 42.4 % (ref 39.0–52.0)
Hemoglobin: 14.7 g/dL (ref 13.0–17.0)
MCHC: 34.7 g/dL (ref 30.0–36.0)
RBC: 4.88 MIL/uL (ref 4.22–5.81)

## 2012-07-03 NOTE — Pre-Procedure Instructions (Signed)
[name]  07/03/2012   Your procedure is scheduled on:  07-10-2012   Friday   Report to [facility] at 5:30 AM.  Call this number if you have problems the morning of surgery: [phone]   Remember:   Do not eat food or drink liquids after midnight.   Take these medicines the morning of surgery with A SIP OF WATER: celexa   Do not wear jewelry,   Do not wear lotions, powders, or perfumes. You may wear deodorant.  Do not shave 48 hours prior to surgery. Men may shave face and neck.  Do not bring valuables to the hospital.  Contacts, dentures or bridgework may not be worn into surgery.    .   Patients discharged the day of surgery will not be allowed to drive home.   Name and phone number of your driver: _____________________   Special Instructions: Shower using CHG 2 nights before surgery and the night before surgery.  If you shower the day of surgery use CHG.  Use special wash - you have one bottle of CHG for all showers.  You should use approximately 1/3 of the bottle for each shower.   Please read over the following fact sheets that you were given: Pain Booklet and Surgical Site Infection Prevention

## 2012-07-03 NOTE — Progress Notes (Signed)
This patient has screened at an elevated risk for obstructive sleep apnea using the STOP bang tool during a pre-surgical visit. A score of 4 or greater is an elevated risk.

## 2012-07-09 MED ORDER — DEXAMETHASONE SODIUM PHOSPHATE 10 MG/ML IJ SOLN
10.0000 mg | Freq: Once | INTRAMUSCULAR | Status: AC
  Administered 2012-07-10: 10 mg via INTRAVENOUS
  Filled 2012-07-09: qty 1

## 2012-07-09 MED ORDER — CEFAZOLIN SODIUM-DEXTROSE 2-3 GM-% IV SOLR
2.0000 g | INTRAVENOUS | Status: AC
  Administered 2012-07-10: 2 g via INTRAVENOUS
  Filled 2012-07-09: qty 50

## 2012-07-10 ENCOUNTER — Ambulatory Visit (HOSPITAL_COMMUNITY): Payer: Managed Care, Other (non HMO) | Admitting: Anesthesiology

## 2012-07-10 ENCOUNTER — Encounter (HOSPITAL_COMMUNITY): Payer: Self-pay | Admitting: *Deleted

## 2012-07-10 ENCOUNTER — Encounter (HOSPITAL_COMMUNITY)
Admission: RE | Disposition: A | Discharge status: Home / Self Care | Payer: Self-pay | Source: Ambulatory Visit | Attending: Otolaryngology

## 2012-07-10 ENCOUNTER — Encounter (HOSPITAL_COMMUNITY): Payer: Self-pay | Admitting: Anesthesiology

## 2012-07-10 ENCOUNTER — Ambulatory Visit (HOSPITAL_COMMUNITY)
Admission: RE | Admit: 2012-07-10 | Discharge: 2012-07-10 | Disposition: A | Discharge status: Home / Self Care | Payer: Managed Care, Other (non HMO) | Source: Ambulatory Visit | Attending: Otolaryngology | Admitting: Otolaryngology

## 2012-07-10 DIAGNOSIS — S02609D Fracture of mandible, unspecified, subsequent encounter for fracture with routine healing: Secondary | ICD-10-CM

## 2012-07-10 DIAGNOSIS — F3289 Other specified depressive episodes: Secondary | ICD-10-CM | POA: Insufficient documentation

## 2012-07-10 DIAGNOSIS — F329 Major depressive disorder, single episode, unspecified: Secondary | ICD-10-CM | POA: Insufficient documentation

## 2012-07-10 DIAGNOSIS — Z472 Encounter for removal of internal fixation device: Secondary | ICD-10-CM | POA: Insufficient documentation

## 2012-07-10 HISTORY — PX: MANDIBULAR HARDWARE REMOVAL: SHX5205

## 2012-07-10 SURGERY — REMOVAL, HARDWARE, MANDIBLE
Anesthesia: General | Site: Mouth | Laterality: Bilateral | Wound class: Clean Contaminated

## 2012-07-10 MED ORDER — ONDANSETRON HCL 4 MG/2ML IJ SOLN
INTRAMUSCULAR | Status: DC | PRN
  Administered 2012-07-10: 4 mg via INTRAVENOUS

## 2012-07-10 MED ORDER — 0.9 % SODIUM CHLORIDE (POUR BTL) OPTIME
TOPICAL | Status: DC | PRN
  Administered 2012-07-10: 1000 mL

## 2012-07-10 MED ORDER — HYDROCODONE-ACETAMINOPHEN 5-325 MG PO TABS: 1.0000 | ORAL_TABLET | Freq: Four times a day (QID) | ORAL | Status: DC | PRN

## 2012-07-10 MED ORDER — ONDANSETRON HCL 4 MG/2ML IJ SOLN: 4.0000 mg | Freq: Once | INTRAMUSCULAR | Status: DC | PRN

## 2012-07-10 MED ORDER — LIDOCAINE-EPINEPHRINE 1 %-1:100000 IJ SOLN
INTRAMUSCULAR | Status: AC
  Filled 2012-07-10: qty 1

## 2012-07-10 MED ORDER — LACTATED RINGERS IV SOLN
INTRAVENOUS | Status: DC | PRN
  Administered 2012-07-10 (×2): via INTRAVENOUS

## 2012-07-10 MED ORDER — PROPOFOL 10 MG/ML IV BOLUS
INTRAVENOUS | Status: DC | PRN
  Administered 2012-07-10: 200 mg via INTRAVENOUS

## 2012-07-10 MED ORDER — FENTANYL CITRATE 0.05 MG/ML IJ SOLN
INTRAMUSCULAR | Status: DC | PRN
  Administered 2012-07-10 (×2): 50 ug via INTRAVENOUS

## 2012-07-10 MED ORDER — DEXTROSE 5 % IV SOLN
INTRAVENOUS | Status: DC | PRN
  Administered 2012-07-10: 08:00:00 via INTRAVENOUS

## 2012-07-10 MED ORDER — AMOXICILLIN-POT CLAVULANATE 500-125 MG PO TABS: 1.0000 | ORAL_TABLET | Freq: Two times a day (BID) | ORAL | Status: DC

## 2012-07-10 MED ORDER — HYDROMORPHONE HCL PF 1 MG/ML IJ SOLN: 0.2500 mg | INTRAMUSCULAR | Status: DC | PRN

## 2012-07-10 MED ORDER — GLYCOPYRROLATE 0.2 MG/ML IJ SOLN
INTRAMUSCULAR | Status: DC | PRN
  Administered 2012-07-10: 0.1 mg via INTRAVENOUS

## 2012-07-10 MED ORDER — LIDOCAINE HCL (CARDIAC) 20 MG/ML IV SOLN
INTRAVENOUS | Status: DC | PRN
  Administered 2012-07-10: 100 mg via INTRAVENOUS

## 2012-07-10 SURGICAL SUPPLY — 20 items
BLADE SURG 15 STRL LF DISP TIS (BLADE) ×1 IMPLANT
BLADE SURG 15 STRL SS (BLADE) ×2
CANISTER SUCTION 2500CC (MISCELLANEOUS) ×2 IMPLANT
CLOTH BEACON ORANGE TIMEOUT ST (SAFETY) ×2 IMPLANT
COVER SURGICAL LIGHT HANDLE (MISCELLANEOUS) ×2 IMPLANT
DECANTER SPIKE VIAL GLASS SM (MISCELLANEOUS) ×2 IMPLANT
DRAPE PROXIMA HALF (DRAPES) ×2 IMPLANT
GLOVE BIOGEL M 7.0 STRL (GLOVE) ×3 IMPLANT
GLOVE SURG SS PI 6.5 STRL IVOR (GLOVE) ×1 IMPLANT
GOWN STRL NON-REIN LRG LVL3 (GOWN DISPOSABLE) ×5 IMPLANT
KIT BASIN OR (CUSTOM PROCEDURE TRAY) ×2 IMPLANT
KIT ROOM TURNOVER OR (KITS) ×2 IMPLANT
NEEDLE 27GAX1X1/2 (NEEDLE) ×2 IMPLANT
NS IRRIG 1000ML POUR BTL (IV SOLUTION) ×2 IMPLANT
PAD ARMBOARD 7.5X6 YLW CONV (MISCELLANEOUS) ×4 IMPLANT
SCISSORS WIRE DISP (INSTRUMENTS) ×1 IMPLANT
SYR CONTROL 10ML LL (SYRINGE) ×2 IMPLANT
TRAY ENT MC OR (CUSTOM PROCEDURE TRAY) ×2 IMPLANT
TUBE CONNECTING 12X1/4 (SUCTIONS) ×2 IMPLANT
YANKAUER SUCT BULB TIP NO VENT (SUCTIONS) ×2 IMPLANT

## 2012-07-10 NOTE — H&P (Signed)
[name] is an 47 y.o. male.   Chief Complaint: Facial fractures HPI: ~2 months s/p facial reconstruction, MMF for fractures  Past Medical History  Diagnosis Date  . Ulcerative colitis, unspecified   . Depression   . Hypogonadism male     Past Surgical History  Procedure Laterality Date  . Orif mandibular fracture Left 05/09/2012    Procedure: OPEN REDUCTION INTERNAL FIXATION (ORIF) RIGHT ANTERIOR MANDIBULAR FRACTURE; Open Reduction Internal Fixation of Midface fracture; Reduction of septal fracture; arch bars; Repair of lacerations;  Surgeon: Jerrell Belfast, MD;  Location: [facility];  Service: ENT;  Laterality: Left;  . Tracheostomy tube placement N/A 05/09/2012    Procedure: TRACHEOSTOMY;  Surgeon: Jerrell Belfast, MD;  Location: [facility] OR;  Service: ENT;  Laterality: N/A;    Family History  Problem Relation Age of Onset  . COPD Father   . Lung cancer     Social History:  reports that he has never smoked. He does not have any smokeless tobacco history on file. He reports that  drinks alcohol. He reports that he does not use illicit drugs.  Allergies:  Allergies  Allergen Reactions  . Metronidazole     REACTION: Fatigue    Medications Prior to Admission  Medication Sig Dispense Refill  . chlorhexidine (PERIDEX) 0.12 % solution Use as directed 15 mLs in the mouth or throat 2 (two) times daily.  120 mL  2  . citalopram (CELEXA) 20 MG tablet Take 20 mg by mouth daily.      . Multiple Vitamins-Minerals (MULTIVITAMINS THER. W/MINERALS) TABS Take 1 tablet by mouth daily.      Marland Kitchen testosterone cypionate (DEPOTESTOTERONE CYPIONATE) 200 MG/ML injection NJECT 1 ML INTRAMUSCULARLY EVERY MONTH  10 mL  0    No results found for this or any previous visit (from the past 48 hour(s)). No results found.  Review of Systems  Constitutional: Negative.   HENT: Negative.   Respiratory: Negative.   Cardiovascular: Negative.   Gastrointestinal: Negative.   Musculoskeletal: Negative.    Neurological: Negative.     Blood pressure 123/83, pulse 59, temperature 97.9 F (36.6 C), temperature source Oral, resp. rate 20, SpO2 98.00%. Physical Exam  Constitutional: He is oriented to person, place, and time. He appears well-developed and well-nourished.  HENT:  Arch bars in place  Neck: Normal range of motion. Neck supple.  Cardiovascular: Normal rate.   Respiratory: Effort normal.  GI: Soft.  Musculoskeletal: Normal range of motion.  Neurological: He is alert and oriented to person, place, and time.     Assessment/Plan Adm for OP removal of arch bars under GA  Beila Purdie 07/10/2012, 7:36 AM

## 2012-07-10 NOTE — Op Note (Signed)
NAME:  [name], [name]               ACCOUNT NO.:  [id number]  MEDICAL RECORD NO.:  [id number]  LOCATION:  [facility]                         FACILITY:  [facility]  PHYSICIAN:  Early Chars. Wilburn Cornelia, M.D.DATE OF BIRTH:  05/21/1965  DATE OF PROCEDURE:  07/10/2012 DATE OF DISCHARGE:                              OPERATIVE REPORT   PREOPERATIVE DIAGNOSES: 1. Status post reconstruction of LeFort III midface fracture. 2. Status post reconstruction of open mandible fracture. 3. Mandibulomaxillary fixation hardware.  POSTOPERATIVE DIAGNOSES: 1. Status post reconstruction of LeFort III midface fracture. 2. Status post reconstruction of open mandible fracture. 3. Mandibulomaxillary fixation hardware.  INDICATIONS FOR SURGERY: 1. Status post reconstruction of LeFort III midface fracture. 2. Status post reconstruction of open mandible fracture. 3. Mandibulomaxillary fixation hardware.  SURGICAL PROCEDURE:  Removal of mandibulomaxillary fixation hardware.  ANESTHESIA:  General/LMA.  SURGEON:  Early Chars. Wilburn Cornelia, MD  COMPLICATIONS:  None.  ESTIMATED BLOOD LOSS:  Minimal.  DISPOSITION:  The patient was transferred from the operating room to the recovery room in stable condition.  BRIEF HISTORY:  The patient is a 47 year old white male, who was admitted to [facility] after suffering a severe facial injury on May 09, 2012.  The patient was cutting a downed tree limb, which snapped and then struck him in the face, resulting in bilateral LeFort III mid facial fractures and open mandibular fracture.  The patient had palatal trauma and significant bleeding and required a tracheostomy for airway management.  This was performed along with reconstructive procedure on May 09, 2012.  After open reduction and internal fixation of these fractures, the patient was placed in mandibular maxillary fixation for approximately 2 months to allow for adequate healing.  He returns to the hospital today in  stable and doing well after his reconstruction for removal of his mandibulomaxillary fixation hardware/arch bars.  The risks and benefits of this procedure were discussed in detail with the patient, who understood and concurred with our plan for surgery, which was scheduled on elective basis at [facility].  DESCRIPTION OF PROCEDURE:  The patient was brought to the operating room, placed in supine position on the operating table.  General LMA anesthesia was established without difficulty.  When the patient was adequately anesthetized, his mouth was inspected and then irrigated with sterile saline.  The arch bar interdental wiring was then removed.  The arch bar themselves were then removed.  The patient's mouth was again irrigated and inspected.  There was no evidence of foreign body, bleeding, or significant swelling.  The patient was then awakened from his anesthetic, extubated, and transferred from the operating room to recovery room in stable condition.  No complications, and blood loss was minimal.          ______________________________ Early Chars. Wilburn Cornelia, M.D.     DLS/MEDQ  D:  54/00/8676  T:  07/10/2012  Job:  [id number]

## 2012-07-10 NOTE — Anesthesia Postprocedure Evaluation (Signed)
  Anesthesia Post-op Note  Patient: [name]  Procedure(s) Performed: Procedure(s): BILATERAL MANDIBULAR HARDWARE REMOVAL (Bilateral)  Patient Location: PACU  Anesthesia Type:General  Level of Consciousness: awake, alert , oriented and patient cooperative  Airway and Oxygen Therapy: Patient Spontanous Breathing  Post-op Pain: mild  Post-op Assessment: Post-op Vital signs reviewed, Patient's Cardiovascular Status Stable, Respiratory Function Stable, Patent Airway, No signs of Nausea or vomiting and Pain level controlled  Post-op Vital Signs: stable  Complications: No apparent anesthesia complications

## 2012-07-10 NOTE — Addendum Note (Signed)
Addended by: Wilburn Cornelia, Adithya Difrancesco on: 07/10/2012 08:24 AM   Modules accepted: Orders

## 2012-07-10 NOTE — Anesthesia Procedure Notes (Addendum)
Procedure Name: LMA Insertion Date/Time: 07/10/2012 7:48 AM Performed by: Williemae Area B Pre-anesthesia Checklist: Patient identified, Emergency Drugs available, Suction available and Patient being monitored Patient Re-evaluated:Patient Re-evaluated prior to inductionOxygen Delivery Method: Circle system utilized Preoxygenation: Pre-oxygenation with 100% oxygen Intubation Type: IV induction Ventilation: Mask ventilation without difficulty LMA: LMA inserted LMA Size: 5.0 Number of attempts: 1 Placement Confirmation: positive ETCO2 and breath sounds checked- equal and bilateral Tube secured with: no tape for surgeon convenience. Dental Injury: Teeth and Oropharynx as per pre-operative assessment

## 2012-07-10 NOTE — Brief Op Note (Signed)
07/10/2012  8:16 AM  PATIENT:  [name]  46 y.o. male  PRE-OPERATIVE DIAGNOSIS:  JAW FRACTURE  POST-OPERATIVE DIAGNOSIS:  JAW FRACTURE  PROCEDURE:  Procedure(s): BILATERAL MANDIBULAR HARDWARE REMOVAL (Bilateral)  SURGEON:  Surgeon(s) and Role:    * Jerrell Belfast, MD - Primary  PHYSICIAN ASSISTANT:   ASSISTANTS: none   ANESTHESIA:   general  EBL:  Total I/O In: 1050 [I.V.:1050] Out: - Min  BLOOD ADMINISTERED:none  DRAINS: none   LOCAL MEDICATIONS USED:  NONE  SPECIMEN:  No Specimen  DISPOSITION OF SPECIMEN:  N/A  COUNTS:  YES  TOURNIQUET:  * No tourniquets in log *  DICTATION: .Other Dictation: Dictation Number [id number]  PLAN OF CARE: Discharge to home after PACU  PATIENT DISPOSITION:  PACU - hemodynamically stable.   Delay start of Pharmacological VTE agent (>24hrs) due to surgical blood loss or risk of bleeding: not applicable

## 2012-07-10 NOTE — Anesthesia Preprocedure Evaluation (Addendum)
Anesthesia Evaluation  Patient identified by MRN, date of birth, ID band Patient awake    Reviewed: Allergy & Precautions, H&P , NPO status , Patient's Chart, lab work & pertinent test results, reviewed documented beta blocker date and time   Airway Mallampati: II TM Distance: >3 FB Neck ROM: Full    Dental  (+) Teeth Intact and Dental Advisory Given   Pulmonary          Cardiovascular     Neuro/Psych    GI/Hepatic PUD,   Endo/Other    Renal/GU      Musculoskeletal   Abdominal   Peds  Hematology   Anesthesia Other Findings Mandibular arch bars in place.  Original facial fractures 05-09-2012  Reproductive/Obstetrics                        Anesthesia Physical Anesthesia Plan  ASA: II  Anesthesia Plan: General and MAC   Post-op Pain Management:    Induction: Intravenous  Airway Management Planned:   Additional Equipment:   Intra-op Plan:   Post-operative Plan:   Informed Consent: I have reviewed the patients History and Physical, chart, labs and discussed the procedure including the risks, benefits and alternatives for the proposed anesthesia with the patient or authorized representative who has indicated his/her understanding and acceptance.     Plan Discussed with: CRNA, Anesthesiologist and Surgeon  Anesthesia Plan Comments:         Anesthesia Quick Evaluation

## 2012-07-10 NOTE — Preoperative (Signed)
Beta Blockers   Reason not to administer Beta Blockers:Not Applicable 

## 2012-07-13 ENCOUNTER — Encounter (HOSPITAL_COMMUNITY): Payer: Self-pay | Admitting: Otolaryngology

## 2012-07-13 NOTE — Transfer of Care (Signed)
Immediate Anesthesia Transfer of Care Note  Patient: [name]  Procedure(s) Performed: Procedure(s): BILATERAL MANDIBULAR HARDWARE REMOVAL (Bilateral)  Patient Location: PACU  Anesthesia Type:General  Level of Consciousness: awake, alert  and patient cooperative  Airway & Oxygen Therapy: Patient Spontanous Breathing and Patient connected to nasal cannula oxygen  Post-op Assessment: Report given to PACU RN and Post -op Vital signs reviewed and stable  Post vital signs: Reviewed and stable  Complications: No apparent anesthesia complications

## 2012-07-20 NOTE — Addendum Note (Signed)
Addendum created 07/20/12 0856 by Josephine Igo, CRNA   Modules edited: Anesthesia Events

## 2012-09-09 ENCOUNTER — Other Ambulatory Visit: Payer: Self-pay | Admitting: Internal Medicine

## 2012-10-29 ENCOUNTER — Other Ambulatory Visit: Payer: Self-pay | Admitting: *Deleted

## 2012-10-29 MED ORDER — CLINDAMYCIN PHOS-BENZOYL PEROX 1.2-2.5 % EX GEL: 1.0000 [drp] | Freq: Two times a day (BID) | CUTANEOUS | Status: DC

## 2013-01-07 ENCOUNTER — Other Ambulatory Visit: Payer: Self-pay

## 2013-02-03 ENCOUNTER — Other Ambulatory Visit: Payer: Self-pay | Admitting: Internal Medicine

## 2013-05-03 ENCOUNTER — Ambulatory Visit (INDEPENDENT_AMBULATORY_CARE_PROVIDER_SITE_OTHER): Payer: BC Managed Care – PPO | Admitting: Family Medicine

## 2013-05-03 ENCOUNTER — Encounter: Payer: Self-pay | Admitting: Family Medicine

## 2013-05-03 VITALS — BP 130/86 | HR 75 | Temp 98.2°F | Ht 68.0 in | Wt 214.0 lb

## 2013-05-03 DIAGNOSIS — J019 Acute sinusitis, unspecified: Secondary | ICD-10-CM

## 2013-05-03 DIAGNOSIS — L989 Disorder of the skin and subcutaneous tissue, unspecified: Secondary | ICD-10-CM

## 2013-05-03 MED ORDER — AMOXICILLIN-POT CLAVULANATE 875-125 MG PO TABS: 1.0000 | ORAL_TABLET | Freq: Two times a day (BID) | ORAL | Status: DC

## 2013-05-03 MED ORDER — TOBRAMYCIN-DEXAMETHASONE 0.3-0.1 % OP OINT: 1.0000 "application " | TOPICAL_OINTMENT | Freq: Three times a day (TID) | OPHTHALMIC | Status: DC

## 2013-05-03 NOTE — Progress Notes (Signed)
Pre visit review using our clinic review tool, if applicable. No additional management support is needed unless otherwise documented below in the visit note. 

## 2013-05-03 NOTE — Progress Notes (Signed)
   Subjective:    Patient ID: [name], male    DOB: 12-Jun-1965, 48 y.o.   MRN: [id number]  HPI Here for several things. First for 4 days he has had burning and redness in both eyes, sinus pressure, PND, and a ST. Also he asks me to check lesions on his face that have been present for several years. They are not changing.    Review of Systems  Constitutional: Negative.   HENT: Positive for congestion, postnasal drip and sinus pressure.   Eyes: Negative.   Respiratory: Negative.        Objective:   Physical Exam  Constitutional: He appears well-developed and well-nourished.  HENT:  Right Ear: External ear normal.  Left Ear: External ear normal.  Nose: Nose normal.  Mouth/Throat: Oropharynx is clear and moist.  Eyes:  Both conjunctivae are red, no DC seen   Pulmonary/Chest: Effort normal and breath sounds normal.  Lymphadenopathy:    He has no cervical adenopathy.  Skin:  He has 10-12 fleshy pink papules over the forehead and both temples           Assessment & Plan:  Given Augmentin and Tobradex. Refer to Dermatology.

## 2013-05-10 ENCOUNTER — Encounter: Payer: Self-pay | Admitting: Internal Medicine

## 2013-05-10 ENCOUNTER — Ambulatory Visit (INDEPENDENT_AMBULATORY_CARE_PROVIDER_SITE_OTHER): Payer: BC Managed Care – PPO | Admitting: Internal Medicine

## 2013-05-10 VITALS — BP 132/84 | HR 72 | Temp 98.2°F | Ht 70.0 in | Wt 216.0 lb

## 2013-05-10 DIAGNOSIS — E291 Testicular hypofunction: Secondary | ICD-10-CM

## 2013-05-10 DIAGNOSIS — Z0000 Encounter for general adult medical examination without abnormal findings: Secondary | ICD-10-CM

## 2013-05-10 LAB — CBC WITH DIFFERENTIAL/PLATELET
Basophils Absolute: 0 10*3/uL (ref 0.0–0.1)
Basophils Relative: 0.2 % (ref 0.0–3.0)
EOS PCT: 2.2 % (ref 0.0–5.0)
Eosinophils Absolute: 0.1 10*3/uL (ref 0.0–0.7)
HEMATOCRIT: 46.6 % (ref 39.0–52.0)
Hemoglobin: 15.6 g/dL (ref 13.0–17.0)
LYMPHS ABS: 2.2 10*3/uL (ref 0.7–4.0)
LYMPHS PCT: 42.2 % (ref 12.0–46.0)
MCHC: 33.5 g/dL (ref 30.0–36.0)
MCV: 89.6 fl (ref 78.0–100.0)
MONOS PCT: 10.7 % (ref 3.0–12.0)
Monocytes Absolute: 0.6 10*3/uL (ref 0.1–1.0)
Neutro Abs: 2.3 10*3/uL (ref 1.4–7.7)
Neutrophils Relative %: 44.7 % (ref 43.0–77.0)
PLATELETS: 248 10*3/uL (ref 150.0–400.0)
RBC: 5.2 Mil/uL (ref 4.22–5.81)
RDW: 13.7 % (ref 11.5–14.6)
WBC: 5.2 10*3/uL (ref 4.5–10.5)

## 2013-05-10 LAB — TSH: TSH: 0.87 u[IU]/mL (ref 0.35–5.50)

## 2013-05-10 LAB — LIPID PANEL
CHOL/HDL RATIO: 7
Cholesterol: 229 mg/dL — ABNORMAL HIGH (ref 0–200)
HDL: 33.4 mg/dL — AB (ref >39.00)
LDL Cholesterol: 151 mg/dL — ABNORMAL HIGH (ref 0–99)
Triglycerides: 222 mg/dL — ABNORMAL HIGH (ref 0.0–149.0)
VLDL: 44.4 mg/dL — ABNORMAL HIGH (ref 0.0–40.0)

## 2013-05-10 LAB — BASIC METABOLIC PANEL
BUN: 20 mg/dL (ref 6–23)
CHLORIDE: 102 meq/L (ref 96–112)
CO2: 29 meq/L (ref 19–32)
Calcium: 9.8 mg/dL (ref 8.4–10.5)
Creatinine, Ser: 0.9 mg/dL (ref 0.4–1.5)
GFR: 102.07 mL/min (ref >60.00)
Glucose, Bld: 81 mg/dL (ref 70–99)
Potassium: 4.6 mEq/L (ref 3.5–5.1)
SODIUM: 139 meq/L (ref 135–145)

## 2013-05-10 LAB — HEPATIC FUNCTION PANEL
ALK PHOS: 69 U/L (ref 39–117)
ALT: 44 U/L (ref 0–53)
AST: 25 U/L (ref 0–37)
Albumin: 4.4 g/dL (ref 3.5–5.2)
BILIRUBIN DIRECT: 0.2 mg/dL (ref 0.0–0.3)
TOTAL PROTEIN: 7.6 g/dL (ref 6.0–8.3)
Total Bilirubin: 1.3 mg/dL — ABNORMAL HIGH (ref 0.3–1.2)

## 2013-05-10 LAB — PSA: PSA: 1.12 ng/mL (ref 0.10–4.00)

## 2013-05-10 MED ORDER — ESCITALOPRAM OXALATE 10 MG PO TABS: 10.0000 mg | ORAL_TABLET | Freq: Every day | ORAL | Status: DC

## 2013-05-10 NOTE — Progress Notes (Signed)
Pre visit review using our clinic review tool, if applicable. No additional management support is needed unless otherwise documented below in the visit note. 

## 2013-05-10 NOTE — Progress Notes (Signed)
Subjective:    Patient ID: [name], male    DOB: 1965/12/04, 48 y.o.   MRN: [id number]  HPI  Memory loss? Celexa? Discussed change to lexapro Otherwise  Health weigth discussed  Review of Systems  Constitutional: Negative for fever and fatigue.  HENT: Negative for congestion, hearing loss and postnasal drip.   Eyes: Negative for discharge, redness and visual disturbance.  Respiratory: Negative for cough, shortness of breath and wheezing.   Cardiovascular: Negative for leg swelling.  Gastrointestinal: Negative for abdominal pain, constipation and abdominal distention.  Genitourinary: Negative for urgency and frequency.  Musculoskeletal: Negative for arthralgias, joint swelling and neck pain.  Skin: Negative for color change and rash.  Neurological: Negative for weakness and light-headedness.  Hematological: Negative for adenopathy.  Psychiatric/Behavioral: Negative for behavioral problems.   Past Medical History  Diagnosis Date  . Ulcerative colitis, unspecified   . Depression   . Hypogonadism male     History   Social History  . Marital Status: Divorced    Spouse Name: N/A    Number of Children: N/A  . Years of Education: N/A   Occupational History  . Not on file.   Social History Main Topics  . Smoking status: Never Smoker   . Smokeless tobacco: Never Used  . Alcohol Use: Yes     Comment: OCCASIONALLY  . Drug Use: No  . Sexual Activity: Yes   Other Topics Concern  . Not on file   Social History Narrative  . No narrative on file    Past Surgical History  Procedure Laterality Date  . Orif mandibular fracture Left 05/09/2012    Procedure: OPEN REDUCTION INTERNAL FIXATION (ORIF) RIGHT ANTERIOR MANDIBULAR FRACTURE; Open Reduction Internal Fixation of Midface fracture; Reduction of septal fracture; arch bars; Repair of lacerations;  Surgeon: Jerrell Belfast, MD;  Location: [place];  Service: ENT;  Laterality: Left;  . Tracheostomy tube placement N/A  05/09/2012    Procedure: TRACHEOSTOMY;  Surgeon: Jerrell Belfast, MD;  Location: [place];  Service: ENT;  Laterality: N/A;  . Mandibular hardware removal Bilateral 07/10/2012    Procedure: BILATERAL MANDIBULAR HARDWARE REMOVAL;  Surgeon: Jerrell Belfast, MD;  Location: [facility];  Service: ENT;  Laterality: Bilateral;    Family History  Problem Relation Age of Onset  . COPD Father   . Lung cancer      Allergies  Allergen Reactions  . Metronidazole     REACTION: Fatigue    Current Outpatient Prescriptions on File Prior to Visit  Medication Sig Dispense Refill  . Multiple Vitamins-Minerals (MULTIVITAMINS THER. W/MINERALS) TABS Take 1 tablet by mouth daily.      Marland Kitchen testosterone cypionate (DEPOTESTOTERONE CYPIONATE) 200 MG/ML injection NJECT 1 ML INTRAMUSCULARLY EVERY 3 weeks      . tobramycin-dexamethasone (TOBRADEX) ophthalmic ointment Place 1 application into both eyes 3 (three) times daily.  3.5 g  0   No current facility-administered medications on file prior to visit.    BP 132/84  Pulse 72  Temp(Src) 98.2 F (36.8 C) (Oral)  Ht 5' 10"  (1.778 m)  Wt 216 lb (97.977 kg)  BMI 30.99 kg/m2       Objective:   Physical Exam  Constitutional: He is oriented to person, place, and time. He appears well-developed and well-nourished.  HENT:  Head: Normocephalic and atraumatic.  Eyes: Conjunctivae are normal. Pupils are equal, round, and reactive to light.  Neck: Normal range of motion. Neck supple.  Cardiovascular: Normal rate and regular rhythm.  Pulmonary/Chest: Effort normal and breath sounds normal.  Abdominal: Soft. Bowel sounds are normal.  Genitourinary: Rectum normal and prostate normal.  Musculoskeletal: Normal range of motion.  Neurological: He is alert and oriented to person, place, and time.  Skin:  tattos  Psychiatric: He has a normal mood and affect. His behavior is normal.      Patient presents for yearly preventative medicine examination. Medicare questionnaire  was completed  All immunizations and health maintenance protocols were reviewed with the patient and needed orders were placed.  Appropriate screening laboratory values were ordered for the patient including screening of hyperlipidemia, renal function and hepatic function. If indicated by BPH, a PSA was ordered.  Medication reconciliation,  past medical history, social history, problem list and allergies were reviewed in detail with the patient  Goals were established with regard to weight loss, exercise, and  diet in compliance with medications  End of life planning was discussed.  Change to lexapro    Assessment & Plan:

## 2013-05-10 NOTE — Patient Instructions (Signed)
The patient is instructed to continue all medications as prescribed. Schedule followup with check out clerk upon leaving the clinic  

## 2013-05-10 NOTE — Addendum Note (Signed)
Addended by: Ricard Dillon on: 05/10/2013 12:36 PM   Modules accepted: Orders

## 2013-05-10 NOTE — Addendum Note (Signed)
Addended by: Elmer Picker on: 05/10/2013 12:46 PM   Modules accepted: Orders

## 2013-05-11 LAB — TESTOSTERONE, FREE, TOTAL, SHBG
SEX HORMONE BINDING: 15 nmol/L (ref 13–71)
TESTOSTERONE FREE: 195.4 pg/mL (ref 47.0–244.0)
TESTOSTERONE: 633 ng/dL (ref 300–890)
Testosterone-% Free: 3.1 % — ABNORMAL HIGH (ref 1.6–2.9)

## 2013-06-25 ENCOUNTER — Telehealth: Payer: Self-pay | Admitting: Internal Medicine

## 2013-06-25 NOTE — Telephone Encounter (Signed)
Pt is on 2nd shift now and having trouble sleeping and requesting name brand ambien call [facility] [address]

## 2013-06-28 MED ORDER — ZOLPIDEM TARTRATE 5 MG PO TABS: 5.0000 mg | ORAL_TABLET | Freq: Every evening | ORAL | Status: DC | PRN

## 2013-06-28 NOTE — Telephone Encounter (Signed)
Per Dr Arnoldo Morale ok to call in ambien 5 mg qhs #30/5, rx called in, pt aware

## 2013-12-17 ENCOUNTER — Other Ambulatory Visit: Payer: Self-pay

## 2014-01-04 ENCOUNTER — Telehealth: Payer: Self-pay | Admitting: Internal Medicine

## 2014-01-04 NOTE — Telephone Encounter (Signed)
Pt request refill of the following: testosterone cypionate (DEPOTESTOTERONE CYPIONATE) 200 MG/ML injection  Pt rx say testosteron cyp 2,000/10 ml   [facility]: [facility] [place]

## 2014-01-05 MED ORDER — TESTOSTERONE CYPIONATE 200 MG/ML IM SOLN: INTRAMUSCULAR | Status: DC

## 2014-01-05 NOTE — Telephone Encounter (Signed)
Ok per Dr Arnoldo Morale, [facility]

## 2014-01-07 ENCOUNTER — Telehealth: Payer: Self-pay | Admitting: Internal Medicine

## 2014-01-07 ENCOUNTER — Encounter: Payer: Self-pay | Admitting: Internal Medicine

## 2014-01-07 NOTE — Telephone Encounter (Signed)
Testosterone was low 4 years ago.  Its not low now because of the medication.  Can you resubmit or fill an appeal?

## 2014-01-07 NOTE — Telephone Encounter (Signed)
Appeal has been submitted

## 2014-01-07 NOTE — Telephone Encounter (Signed)
PA request for testosterone cypionate was denied due to pt not having low testosterone level.

## 2014-01-14 NOTE — Telephone Encounter (Signed)
Appeal was approved and pharmacy is aware, they will contact patient.

## 2014-01-17 ENCOUNTER — Telehealth: Payer: Self-pay | Admitting: Internal Medicine

## 2014-01-17 MED ORDER — ZOLPIDEM TARTRATE 5 MG PO TABS: 5.0000 mg | ORAL_TABLET | Freq: Every evening | ORAL | Status: DC | PRN

## 2014-01-17 NOTE — Telephone Encounter (Signed)
Ok x3 per Dr Arnoldo Morale, rx sent in electronically

## 2014-01-17 NOTE — Telephone Encounter (Signed)
[name] [address] requesting re-fill on zolpidem (AMBIEN) 5 MG tablet

## 2014-01-31 ENCOUNTER — Ambulatory Visit (INDEPENDENT_AMBULATORY_CARE_PROVIDER_SITE_OTHER)
Admission: RE | Admit: 2014-01-31 | Discharge: 2014-01-31 | Disposition: A | Discharge status: Home / Self Care | Payer: BC Managed Care – PPO | Source: Ambulatory Visit | Attending: Family Medicine | Admitting: Family Medicine

## 2014-01-31 ENCOUNTER — Telehealth: Payer: Self-pay

## 2014-01-31 ENCOUNTER — Encounter: Payer: Self-pay | Admitting: Family Medicine

## 2014-01-31 ENCOUNTER — Encounter (HOSPITAL_COMMUNITY): Payer: Self-pay | Admitting: Family Medicine

## 2014-01-31 ENCOUNTER — Emergency Department (HOSPITAL_COMMUNITY): Payer: BC Managed Care – PPO

## 2014-01-31 ENCOUNTER — Ambulatory Visit (INDEPENDENT_AMBULATORY_CARE_PROVIDER_SITE_OTHER): Payer: BC Managed Care – PPO | Admitting: Family Medicine

## 2014-01-31 ENCOUNTER — Inpatient Hospital Stay (HOSPITAL_COMMUNITY)
Admission: EM | Admit: 2014-01-31 | Discharge: 2014-02-02 | DRG: 176 | Disposition: A | Discharge status: Home / Self Care | Payer: BC Managed Care – PPO | Attending: Internal Medicine | Admitting: Internal Medicine

## 2014-01-31 VITALS — BP 120/70 | HR 78 | Temp 97.3°F | Wt 218.0 lb

## 2014-01-31 DIAGNOSIS — I2699 Other pulmonary embolism without acute cor pulmonale: Principal | ICD-10-CM | POA: Diagnosis present

## 2014-01-31 DIAGNOSIS — Z930 Tracheostomy status: Secondary | ICD-10-CM | POA: Diagnosis not present

## 2014-01-31 DIAGNOSIS — Z6828 Body mass index (BMI) 28.0-28.9, adult: Secondary | ICD-10-CM | POA: Diagnosis not present

## 2014-01-31 DIAGNOSIS — R0600 Dyspnea, unspecified: Secondary | ICD-10-CM | POA: Diagnosis present

## 2014-01-31 DIAGNOSIS — E663 Overweight: Secondary | ICD-10-CM | POA: Diagnosis present

## 2014-01-31 DIAGNOSIS — I82402 Acute embolism and thrombosis of unspecified deep veins of left lower extremity: Secondary | ICD-10-CM | POA: Diagnosis present

## 2014-01-31 DIAGNOSIS — I82432 Acute embolism and thrombosis of left popliteal vein: Secondary | ICD-10-CM | POA: Diagnosis present

## 2014-01-31 DIAGNOSIS — E291 Testicular hypofunction: Secondary | ICD-10-CM | POA: Diagnosis present

## 2014-01-31 DIAGNOSIS — K5190 Ulcerative colitis, unspecified, without complications: Secondary | ICD-10-CM | POA: Diagnosis present

## 2014-01-31 DIAGNOSIS — I82812 Embolism and thrombosis of superficial veins of left lower extremities: Secondary | ICD-10-CM | POA: Diagnosis present

## 2014-01-31 DIAGNOSIS — I82442 Acute embolism and thrombosis of left tibial vein: Secondary | ICD-10-CM | POA: Diagnosis present

## 2014-01-31 DIAGNOSIS — R0602 Shortness of breath: Secondary | ICD-10-CM | POA: Diagnosis present

## 2014-01-31 DIAGNOSIS — R0609 Other forms of dyspnea: Secondary | ICD-10-CM | POA: Diagnosis present

## 2014-01-31 DIAGNOSIS — E041 Nontoxic single thyroid nodule: Secondary | ICD-10-CM | POA: Diagnosis present

## 2014-01-31 DIAGNOSIS — I2782 Chronic pulmonary embolism: Secondary | ICD-10-CM | POA: Insufficient documentation

## 2014-01-31 DIAGNOSIS — Z888 Allergy status to other drugs, medicaments and biological substances status: Secondary | ICD-10-CM | POA: Diagnosis not present

## 2014-01-31 DIAGNOSIS — F329 Major depressive disorder, single episode, unspecified: Secondary | ICD-10-CM | POA: Diagnosis present

## 2014-01-31 LAB — CBC WITH DIFFERENTIAL/PLATELET
BASOS ABS: 0 10*3/uL (ref 0.0–0.1)
Basophils Relative: 0.6 % (ref 0.0–3.0)
EOS ABS: 0.4 10*3/uL (ref 0.0–0.7)
Eosinophils Relative: 5.4 % — ABNORMAL HIGH (ref 0.0–5.0)
HCT: 48.3 % (ref 39.0–52.0)
Hemoglobin: 16 g/dL (ref 13.0–17.0)
LYMPHS PCT: 34.7 % (ref 12.0–46.0)
Lymphs Abs: 2.5 10*3/uL (ref 0.7–4.0)
MCHC: 33.2 g/dL (ref 30.0–36.0)
MCV: 90 fl (ref 78.0–100.0)
MONO ABS: 0.8 10*3/uL (ref 0.1–1.0)
Monocytes Relative: 11.2 % (ref 3.0–12.0)
NEUTROS PCT: 48.1 % (ref 43.0–77.0)
Neutro Abs: 3.5 10*3/uL (ref 1.4–7.7)
PLATELETS: 226 10*3/uL (ref 150.0–400.0)
RBC: 5.37 Mil/uL (ref 4.22–5.81)
RDW: 14.1 % (ref 11.5–15.5)
WBC: 7.2 10*3/uL (ref 4.0–10.5)

## 2014-01-31 LAB — COMPREHENSIVE METABOLIC PANEL
ALK PHOS: 79 U/L (ref 39–117)
ALT: 34 U/L (ref 0–53)
AST: 22 U/L (ref 0–37)
Albumin: 4.3 g/dL (ref 3.5–5.2)
BILIRUBIN TOTAL: 0.6 mg/dL (ref 0.2–1.2)
BUN: 14 mg/dL (ref 6–23)
CO2: 28 mEq/L (ref 19–32)
CREATININE: 1 mg/dL (ref 0.4–1.5)
Calcium: 9.5 mg/dL (ref 8.4–10.5)
Chloride: 100 mEq/L (ref 96–112)
GFR: 88.55 mL/min (ref >60.00)
Glucose, Bld: 93 mg/dL (ref 70–99)
Potassium: 4.6 mEq/L (ref 3.5–5.1)
SODIUM: 135 meq/L (ref 135–145)
TOTAL PROTEIN: 7.8 g/dL (ref 6.0–8.3)

## 2014-01-31 LAB — TROPONIN I: TNIDX: 0.04 ug/L (ref 0.00–0.06)

## 2014-01-31 LAB — D-DIMER, QUANTITATIVE (NOT AT ARMC): D DIMER QUANT: 5.22 ug{FEU}/mL — AB (ref 0.00–0.48)

## 2014-01-31 LAB — PRO B NATRIURETIC PEPTIDE: Pro B Natriuretic peptide (BNP): 185.4 pg/mL — ABNORMAL HIGH (ref 0.0–100.0)

## 2014-01-31 LAB — LACTIC ACID, PLASMA: LACTIC ACID, VENOUS: 1.6 mmol/L (ref 0.5–2.2)

## 2014-01-31 MED ORDER — ENOXAPARIN SODIUM 100 MG/ML ~~LOC~~ SOLN
1.0000 mg/kg | Freq: Once | SUBCUTANEOUS | Status: AC
  Administered 2014-01-31: 100 mg via SUBCUTANEOUS
  Filled 2014-01-31: qty 1

## 2014-01-31 MED ORDER — IOHEXOL 350 MG/ML SOLN
80.0000 mL | Freq: Once | INTRAVENOUS | Status: AC | PRN
  Administered 2014-01-31: 80 mL via INTRAVENOUS

## 2014-01-31 MED ORDER — HEPARIN (PORCINE) IN NACL 100-0.45 UNIT/ML-% IJ SOLN
1500.0000 [IU]/h | INTRAMUSCULAR | Status: DC
  Administered 2014-02-01: 1500 [IU]/h via INTRAVENOUS
  Filled 2014-01-31 (×3): qty 250

## 2014-01-31 NOTE — Telephone Encounter (Signed)
D-DIMER is 5.22 which is alert high.  Normal is 0-0.48.  Pls advise.

## 2014-01-31 NOTE — ED Notes (Signed)
MD in with patient

## 2014-01-31 NOTE — ED Notes (Signed)
Per pt sts 3 weeks ago he started weight training again after 45 min work out he began SOB. sts since he has continued to be SOB with exertion. sts Saturday he felt like his pulse was fast and didn't feel well. sts was sent here for elevated d dimer.

## 2014-01-31 NOTE — ED Provider Notes (Signed)
CSN: [id number]     Arrival date & time 01/31/14  1812 History   First MD Initiated Contact with Patient 01/31/14 2005     Chief Complaint  Patient presents with  . Shortness of Breath     (Consider location/radiation/quality/duration/timing/severity/associated sxs/prior Treatment) HPI Comments: Patient is a 48 year old male with no significant past medical history. He presents with complaints of shortness of breath that is worsened over the past week. He states he started back to the gym several weeks ago. After one workout in particular, he stated he felt like he couldn't breathe. He has been having intermittent dyspnea with exertion since that time. He had no episode yesterday where he felt like his heart was racing while he was at rest and made him feel uncomfortable. This has not occurred again since that time. He denies any fevers or chills. He denies any productive cough.  He was seen by his primary Dr. this morning. He had laboratory work performed as well as an EKG. This revealed an elevated d-dimer and he was sent here for rule out of pulmonary embolism.  Patient is a 48 y.o. male presenting with shortness of breath. The history is provided by the patient.  Shortness of Breath Severity:  Moderate Onset quality:  Sudden Duration:  1 week Timing:  Constant Progression:  Worsening Chronicity:  New Context: activity   Relieved by:  Nothing Worsened by:  Exertion and activity Ineffective treatments:  None tried Associated symptoms: no abdominal pain, no chest pain, no fever and no syncope     Past Medical History  Diagnosis Date  . Ulcerative colitis, unspecified   . Depression   . Hypogonadism male    Past Surgical History  Procedure Laterality Date  . Orif mandibular fracture Left 05/09/2012    Procedure: OPEN REDUCTION INTERNAL FIXATION (ORIF) RIGHT ANTERIOR MANDIBULAR FRACTURE; Open Reduction Internal Fixation of Midface fracture; Reduction of septal fracture; arch bars;  Repair of lacerations;  Surgeon: Jerrell Belfast, MD;  Location: [place];  Service: ENT;  Laterality: Left;  . Tracheostomy tube placement N/A 05/09/2012    Procedure: TRACHEOSTOMY;  Surgeon: Jerrell Belfast, MD;  Location: [place];  Service: ENT;  Laterality: N/A;  . Mandibular hardware removal Bilateral 07/10/2012    Procedure: BILATERAL MANDIBULAR HARDWARE REMOVAL;  Surgeon: Jerrell Belfast, MD;  Location: [facility];  Service: ENT;  Laterality: Bilateral;   Family History  Problem Relation Age of Onset  . COPD Father   . Lung cancer     History  Substance Use Topics  . Smoking status: Never Smoker   . Smokeless tobacco: Never Used  . Alcohol Use: Yes     Comment: OCCASIONALLY    Review of Systems  Constitutional: Negative for fever.  Respiratory: Positive for shortness of breath.   Cardiovascular: Negative for chest pain and syncope.  Gastrointestinal: Negative for abdominal pain.  All other systems reviewed and are negative.     Allergies  Metronidazole  Home Medications   Prior to Admission medications   Medication Sig Start Date End Date Taking? Authorizing Provider  testosterone cypionate (DEPOTESTOTERONE CYPIONATE) 200 MG/ML injection NJECT 1 ML INTRAMUSCULARLY EVERY 3 weeks 01/05/14  Yes Ricard Dillon, MD   BP 143/92 mmHg  Pulse 89  Temp(Src) 97.6 F (36.4 C)  Resp 18  SpO2 97% Physical Exam  Constitutional: He is oriented to person, place, and time. He appears well-developed and well-nourished. No distress.  HENT:  Head: Normocephalic and atraumatic.  Mouth/Throat: Oropharynx is clear and  moist.  Neck: Normal range of motion. Neck supple.  Cardiovascular: Normal rate, regular rhythm and normal heart sounds.   No murmur heard. Pulmonary/Chest: Effort normal and breath sounds normal. No respiratory distress. He has no wheezes.  Abdominal: Soft. Bowel sounds are normal. He exhibits no distension. There is no tenderness.  Musculoskeletal: Normal range of motion. He  exhibits no edema.  There is no calf swelling or tenderness. Homans sign is absent.  Lymphadenopathy:    He has no cervical adenopathy.  Neurological: He is alert and oriented to person, place, and time.  Skin: Skin is warm and dry. He is not diaphoretic.  Nursing note and vitals reviewed.   ED Course  Procedures (including critical care time) Labs Review Labs Reviewed - No data to display  Imaging Review Dg Chest 2 View  01/31/2014   CLINICAL DATA:  Persistent dyspnea on exertion.  ICD10: R 6 .02  EXAM: CHEST  2 VIEW  COMPARISON:  05/09/2012  FINDINGS: A mild pectus excavatum deformity. Midline trachea. Normal heart size and mediastinal contours. No pleural effusion or pneumothorax. Clear lungs.  IMPRESSION: No acute cardiopulmonary disease.   Electronically Signed   By: Abigail Miyamoto M.D.   On: 01/31/2014 13:30     EKG Interpretation   Date/Time:  Monday January 31 2014 18:27:32 EST Ventricular Rate:  76 PR Interval:  136 QRS Duration: 90 QT Interval:  400 QTC Calculation: 450 R Axis:   75 Text Interpretation:  Normal sinus rhythm with sinus arrhythmia Normal ECG  Confirmed by DELOS  MD, Holton Sidman ([id number]) on 01/31/2014 8:22:19 PM      MDM   Final diagnoses:  SOB (shortness of breath)    Patient sent to the ER by primary care doctor for rule out of pulmonary embolism. He's been experiencing shortness of breath with ambulation and palpitations for the past week. Workup reveals large bilateral pulmonary emboli causing right-sided heart strain. I discussed these findings with Dr. Lamonte Sakai from critical care who does not feel as though the patient warrants emergent thrombolysis. He is recommending admission to the hospitalist service and his condition will be reevaluated in the morning. He was given Lovenox at the request of Dr. Lamonte Sakai.    Veryl Speak, MD 02/01/14 [phone]

## 2014-01-31 NOTE — Consult Note (Signed)
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 He is also history of ulcerative colitis not otherwise specified. He travels a lot between [place] and [place] by car. Most recently went to [place] without much. In early part of October 2015. Around that time he started noticing some left shin pain that lasted for several weeks. This then resolved and gave way to mild exertional dyspnea that has been going on for several weeks now. Dyspnea is relieved by rest. It is very mild. He reported as primary care physician office today 01/31/2014 and a d-dimer was ordered and it was elevated at 5.2. Therefore he was sent to the emergency department where a CT angiogram revealed a lateral central pulmonary embolism of large burden anatomically but physiologically patient is been doing really well. Lovenox 100 mg subcutaneous today's injection is been given. Pulmonary critical care has been consulted for possible right ventricular strain based on echo findings but clinically patient is been doing well   Pulmonary embolism and DVT risk factors - Recent prolonged travel by car + - No history of immobility or cancer - Testosterone injections - No family history of pulmonary embolism  PAST MEDICAL HISTORY :   has a past medical history of Ulcerative colitis, unspecified; Depression; and Hypogonadism male.  has past surgical history that includes ORIF mandibular fracture (Left, 05/09/2012);  Tracheostomy tube placement (N/A, 05/09/2012); and Mandibular hardware removal (Bilateral, 07/10/2012). Prior to Admission medications   Medication Sig Start Date End Date Taking? Authorizing Provider  testosterone cypionate (DEPOTESTOTERONE CYPIONATE) 200 MG/ML injection NJECT 1 ML INTRAMUSCULARLY EVERY 3 weeks 01/05/14  Yes Ricard Dillon, MD   Allergies  Allergen Reactions  . Metronidazole Other (See Comments)    REACTION: Fatigue    FAMILY HISTORY:  has no family status information on file.  SOCIAL HISTORY:  reports that he has never smoked. He has never used smokeless tobacco. He reports that he drinks alcohol. He reports that he does not use illicit drugs.  REVIEW OF SYSTEMS:  Mild shortness of breath but otherwise 11 point review of systems is essentially negative  SUBJECTIVE:   VITAL SIGNS: Temp:  [97.3 F (36.3 C)-97.6 F (36.4 C)] 97.6 F (36.4 C) (11/30 1830) Pulse Rate:  [78-89] 89 (11/30 1830) Resp:  [18] 18 (11/30 1830) BP: (120-143)/(70-92) 143/92 mmHg (11/30 1830) SpO2:  [97 %-98 %] 97 % (11/30 1830) Weight:  [98.884 kg (218 lb)] 98.884 kg (218 lb) (11/30 1109) HEMODYNAMICS:   VENTILATOR SETTINGS:   INTAKE / OUTPUT: No intake or output data in the 24 hours ending 01/31/14 2315  PHYSICAL EXAMINATION: General:  Well-built muscular male in no distress Neuro:  Alert and oriented 3, Glasgow Coma Scale 15, speech normal, moves all 4 extremity is normally HEENT:  Supple neck, no elevated JVP no neck nodes Cardiovascular:  Normal heart sounds regular rate and rhythm normal cardiac Lungs:  Clear to auscultation bilaterally no wheezes no loud P2 Abdomen:  Soft nontender no organomegaly Musculoskeletal:  No cyanosis no  clubbing no edema Skin:  Intact and he has tattoos  LABS: PULMONARY No results for input(s): PHART, PCO2ART, PO2ART, HCO3, TCO2, O2SAT in the last 168 hours.  Invalid input(s): PCO2, PO2  CBC  Recent Labs Lab 01/31/14 1226  HGB 16.0  HCT 48.3   WBC 7.2  PLT 226.0    COAGULATION No results for input(s): INR in the last 168 hours.  CARDIAC  No results for input(s): TROPONINI in the last 168 hours. No results for input(s): PROBNP in the last 168 hours.   CHEMISTRY  Recent Labs Lab 01/31/14 1226  NA 135  K 4.6  CL 100  CO2 28  GLUCOSE 93  BUN 14  CREATININE 1.0  CALCIUM 9.5   Estimated Creatinine Clearance: 109 mL/min (by C-G formula based on Cr of 1).   LIVER  Recent Labs Lab 01/31/14 1226  AST 22  ALT 34  ALKPHOS 79  BILITOT 0.6  PROT 7.8  ALBUMIN 4.3     INFECTIOUS No results for input(s): LATICACIDVEN, PROCALCITON in the last 168 hours.   ENDOCRINE CBG (last 3)  No results for input(s): GLUCAP in the last 72 hours.       IMAGING x48h Dg Chest 2 View  01/31/2014   CLINICAL DATA:  Persistent dyspnea on exertion.  ICD10: R 6 .02  EXAM: CHEST  2 VIEW  COMPARISON:  05/09/2012  FINDINGS: A mild pectus excavatum deformity. Midline trachea. Normal heart size and mediastinal contours. No pleural effusion or pneumothorax. Clear lungs.  IMPRESSION: No acute cardiopulmonary disease.   Electronically Signed   By: Abigail Miyamoto M.D.   On: 01/31/2014 13:30   Ct Angio Chest Pe W/cm &/or Wo Cm  01/31/2014   CLINICAL DATA:  Chest work out 3 weeks ago with persistent chest pain shortness of breath. Evaluate for pulmonary embolism.  EXAM: CT ANGIOGRAPHY CHEST WITH CONTRAST  TECHNIQUE: Multidetector CT imaging of the chest was performed using the standard protocol during bolus administration of intravenous contrast. Multiplanar CT image reconstructions and MIPs were obtained to evaluate the vascular anatomy.  CONTRAST:  87m OMNIPAQUE IOHEXOL 350 MG/ML SOLN  COMPARISON:  Chest radiograph -01/31/2014  FINDINGS: Vascular Findings:  There is adequate opacification of the pulmonary arterial system with the main pulmonary artery measuring 403 Hounsfield units. There are multiple mixed occlusive and nonocclusive filling  defects within the peripheral aspects of the right and left pulmonary arteries with extension to involve nearly all segmental and multiple distal subsegmental pulmonary arteries of all lobes of the bilateral lungs. Overall clot burden is deemed large in volume.  While there is no definitive enlargement of the caliber the main pulmonary artery (measuring 29 mm in maximal oblique axial dimension - image 43, series 401), there is bowing of the interventricular septum with abnormal right to left ventricular ratio of approximately 0.92 (right ventricle measuring 34 mm, left ventricle measuring approximately 37 mm). There is no CT evidence of pulmonary infarction. There is no reflux of injected contrast into the intrahepatic venous system.  Cardiomegaly. No pericardial effusion. Normal caliber the thoracic aorta. Bovine configuration of the aortic arch. The branch vessels of the aortic arch widely patent throughout their imaged course. No definite thoracic aortic dissection or periaortic stranding.  Review of the MIP images confirms the above findings.   ----------------------------------------------------------------------------------  Nonvascular Findings:  Minimal bibasilar dependent ground-glass atelectasis. No discrete focal airspace opacities. No pleural effusion or pneumothorax. The central pulmonary airways appear widely patent. No discrete pulmonary nodules. No mediastinal, hilar  axillary lymphadenopathy.  Limited visualization of the upper abdomen is normal.  No acute or aggressive osseus abnormalities.  Regional soft tissues appear normal. Note is made of a punctate (approximately 0.8 cm) hypo attenuating nodule within the anterior aspect of the left lobe of the thyroid (image 6, series 401).  IMPRESSION: 1. Positive for acute PE with CT evidence of right heart strain (RV/LV Ratio = 0.92) consistent with at least submassive (intermediate risk) PE. The presence of right heart strain has been associated with an  increased risk of morbidity and mortality. Consultation with Pulmonary and Critical Care Medicine is recommended. 2. Indeterminate punctate (approximately 0.8 cm) hypo attenuating nodule within the left lobe of the thyroid. Further evaluation with nonemergent thyroid ultrasound could be performed as clinically indicated. Critical Value/emergent results were called by telephone at the time of interpretation on 01/31/2014 at 9:45 pm to Dr. Veryl Speak, who verbally acknowledged these results.   Electronically Signed   By: Sandi Mariscal M.D.   On: 01/31/2014 21:51        ASSESSMENT / PLAN:  PULMONARY  A: Submassive bilateral pulmonary embolism anatomically. But clinically this is very mild. His pulmonary embolism severity index score [PESI) score is 56 and reflects mild pulmonary embolism class I. His 30 day outcome is excellent. Based on this his RV strain is only anatomic and is not physiologic   P:   - Take d-dimer, troponin, lactic acid and if normal as further proof that RV strain is not physiologic - Typically this is something that can be treated as an outpatient with Lovenox given his class I severity - However, given the anatomic size it would be prudent to admit (he can go to a regular medical bed with telemetry] - He is already received one dose of subcutaneous Lovenox. I've written for IV heparin to be started but if he stable in the next 24 hours he can just get Lovenox subcutaneous injections - Further discussion needs to be had with him about Coumadin versus newer anticoagulation therapies and the duration of this treatment - I do not think he should take testosterone injections anymore but this needs further on follow-up   Pulmonary will see him Can be triaged her regular medical bed on the surface 2015 if he stable    D/w patient and niece and DR Hal Hope    Dr. Brand Males, M.D., [facility].C.P Pulmonary and Critical Care Medicine Staff Physician [facility] Pager: [phone], If no answer or between  15:00h - 7:00h: call [phone]  01/31/2014 11:29 PM

## 2014-01-31 NOTE — Patient Instructions (Signed)
Labs today.   I am more worried about potential lung causes of shortness of breath but with your slight EKG change at least want to get cardiology's opinion.   See written in information.   If symptoms were to worsen or new ones were to appear, please seek care.   Let's regroup next week with a visit here.

## 2014-01-31 NOTE — ED Notes (Signed)
Called 2H to give report.  Receiving RN unable to take report qat this time.

## 2014-01-31 NOTE — ED Notes (Signed)
Pt returned from CT °

## 2014-01-31 NOTE — Telephone Encounter (Signed)
Advised patient to go to the Emergency room for evaluation to rule out pulmonary embolism.   [name] just [employer]

## 2014-01-31 NOTE — Progress Notes (Signed)
Called ED for report from [facility]. Report received.

## 2014-01-31 NOTE — Assessment & Plan Note (Addendum)
Original plan was CBC, troponin, d-dimer, CMP in office and if negative visit with cardiology on Friday given flipped t wave in V2. Patient was also to go for CXR and PFTs given atypical presentation of 3 weeks of shortness of breath with exertion in otherwise relatively healthy 48 year old. CXR without acute findings. Blood work came back normal except for elevated d-dimer and sent patient to ED for further evaluation including likely CT angiogram. Given this-potentially cancel PFTs and cards visit pending ED evaluation/findings. PFTs due to concern there has been some links between ulcerative colitis and pulmonary disease.

## 2014-01-31 NOTE — Telephone Encounter (Signed)
Dr. Yong Channel please see below

## 2014-01-31 NOTE — H&P (Signed)
[facility] History and Physical  [name] JHE:[id number] DOB: 09/10/1965 DOA: 01/31/2014  Referring physician: ER physician. PCP: Georgetta Haber, MD   Chief Complaint: Shortness of breath.  HPI: [name] is a 47 y.o. male with history of ulcerative colitis in remission was referred to the ER by his PCP after patient was complaining of shortness of breath. Patient says that he had recently started going back to the gym 3 weeks ago. Since then patient was feeling increasingly short of breath with minimal exertion. Denies any chest pain fever chills or productive cough. He had gone to his PCP and was found to have elevated d-dimer and was referred to the ER. In the ER CT angiographic of the chest shows pulmonary embolism with possible right ventricular strain. Patient is hemodynamically stable otherwise. Patient denies any recent travel or surgery or any swelling in the lower extremities. There is no family history of PE or DVT. Patient does not have any personal history of PE or DVT. Patient has been admitted for further management. On-call pulmonary critical care was consulted by the ER physician.   Review of Systems: As presented in the history of presenting illness, rest negative.  Past Medical History  Diagnosis Date  . Ulcerative colitis, unspecified   . Depression   . Hypogonadism male    Past Surgical History  Procedure Laterality Date  . Orif mandibular fracture Left 05/09/2012    Procedure: OPEN REDUCTION INTERNAL FIXATION (ORIF) RIGHT ANTERIOR MANDIBULAR FRACTURE; Open Reduction Internal Fixation of Midface fracture; Reduction of septal fracture; arch bars; Repair of lacerations;  Surgeon: Jerrell Belfast, MD;  Location: [place];  Service: ENT;  Laterality: Left;  . Tracheostomy tube placement N/A 05/09/2012    Procedure: TRACHEOSTOMY;  Surgeon: Jerrell Belfast, MD;  Location: [place];  Service: ENT;  Laterality: N/A;  . Mandibular hardware removal Bilateral  07/10/2012    Procedure: BILATERAL MANDIBULAR HARDWARE REMOVAL;  Surgeon: Jerrell Belfast, MD;  Location: [facility] OR;  Service: ENT;  Laterality: Bilateral;   Social History:  reports that he has never smoked. He has never used smokeless tobacco. He reports that he drinks alcohol. He reports that he does not use illicit drugs. Where does patient live home. Can patient participate in ADLs? Yes.  Allergies  Allergen Reactions  . Metronidazole Other (See Comments)    REACTION: Fatigue    Family History:  Family History  Problem Relation Age of Onset  . COPD Father   . Lung cancer        Prior to Admission medications   Medication Sig Start Date End Date Taking? Authorizing Provider  testosterone cypionate (DEPOTESTOTERONE CYPIONATE) 200 MG/ML injection NJECT 1 ML INTRAMUSCULARLY EVERY 3 weeks 01/05/14  Yes Ricard Dillon, MD    Physical Exam: Filed Vitals:   01/31/14 1830  BP: 143/92  Pulse: 89  Temp: 97.6 F (36.4 C)  Resp: 18  SpO2: 97%     General:  Well-developed and nourished.  Eyes: Anicteric no pallor.  ENT: No discharge from the ears eyes nose mouth.  Neck: No mass felt.  Cardiovascular: S1-S2 heard.  Respiratory: No rhonchi or crepitations.  Abdomen: Soft nontender bowel sounds present.  Skin: No rash.  Musculoskeletal: No edema.  Psychiatric: Appears normal.  Neurologic: Alert awake oriented to time place and person. Moves all extremities.  Labs on Admission:  Basic Metabolic Panel:  Recent Labs Lab 01/31/14 1226  NA 135  K 4.6  CL 100  CO2 28  GLUCOSE 93  BUN 14  CREATININE 1.0  CALCIUM 9.5   Liver Function Tests:  Recent Labs Lab 01/31/14 1226  AST 22  ALT 34  ALKPHOS 79  BILITOT 0.6  PROT 7.8  ALBUMIN 4.3   No results for input(s): LIPASE, AMYLASE in the last 168 hours. No results for input(s): AMMONIA in the last 168 hours. CBC:  Recent Labs Lab 01/31/14 1226  WBC 7.2  NEUTROABS 3.5  HGB 16.0  HCT 48.3  MCV 90.0  PLT  226.0   Cardiac Enzymes: No results for input(s): CKTOTAL, CKMB, CKMBINDEX, TROPONINI in the last 168 hours.  BNP (last 3 results) No results for input(s): PROBNP in the last 8760 hours. CBG: No results for input(s): GLUCAP in the last 168 hours.  Radiological Exams on Admission: Dg Chest 2 View  01/31/2014   CLINICAL DATA:  Persistent dyspnea on exertion.  ICD10: R 6 .02  EXAM: CHEST  2 VIEW  COMPARISON:  05/09/2012  FINDINGS: A mild pectus excavatum deformity. Midline trachea. Normal heart size and mediastinal contours. No pleural effusion or pneumothorax. Clear lungs.  IMPRESSION: No acute cardiopulmonary disease.   Electronically Signed   By: Abigail Miyamoto M.D.   On: 01/31/2014 13:30   Ct Angio Chest Pe W/cm &/or Wo Cm  01/31/2014   CLINICAL DATA:  Chest work out 3 weeks ago with persistent chest pain shortness of breath. Evaluate for pulmonary embolism.  EXAM: CT ANGIOGRAPHY CHEST WITH CONTRAST  TECHNIQUE: Multidetector CT imaging of the chest was performed using the standard protocol during bolus administration of intravenous contrast. Multiplanar CT image reconstructions and MIPs were obtained to evaluate the vascular anatomy.  CONTRAST:  31m OMNIPAQUE IOHEXOL 350 MG/ML SOLN  COMPARISON:  Chest radiograph -01/31/2014  FINDINGS: Vascular Findings:  There is adequate opacification of the pulmonary arterial system with the main pulmonary artery measuring 403 Hounsfield units. There are multiple mixed occlusive and nonocclusive filling defects within the peripheral aspects of the right and left pulmonary arteries with extension to involve nearly all segmental and multiple distal subsegmental pulmonary arteries of all lobes of the bilateral lungs. Overall clot burden is deemed large in volume.  While there is no definitive enlargement of the caliber the main pulmonary artery (measuring 29 mm in maximal oblique axial dimension - image 43, series 401), there is bowing of the interventricular septum  with abnormal right to left ventricular ratio of approximately 0.92 (right ventricle measuring 34 mm, left ventricle measuring approximately 37 mm). There is no CT evidence of pulmonary infarction. There is no reflux of injected contrast into the intrahepatic venous system.  Cardiomegaly. No pericardial effusion. Normal caliber the thoracic aorta. Bovine configuration of the aortic arch. The branch vessels of the aortic arch widely patent throughout their imaged course. No definite thoracic aortic dissection or periaortic stranding.  Review of the MIP images confirms the above findings.   ----------------------------------------------------------------------------------  Nonvascular Findings:  Minimal bibasilar dependent ground-glass atelectasis. No discrete focal airspace opacities. No pleural effusion or pneumothorax. The central pulmonary airways appear widely patent. No discrete pulmonary nodules. No mediastinal, hilar axillary lymphadenopathy.  Limited visualization of the upper abdomen is normal.  No acute or aggressive osseus abnormalities.  Regional soft tissues appear normal. Note is made of a punctate (approximately 0.8 cm) hypo attenuating nodule within the anterior aspect of the left lobe of the thyroid (image 6, series 401).  IMPRESSION: 1. Positive for acute PE with CT evidence of right heart strain (RV/LV Ratio = 0.92) consistent with at least  submassive (intermediate risk) PE. The presence of right heart strain has been associated with an increased risk of morbidity and mortality. Consultation with Pulmonary and Critical Care Medicine is recommended. 2. Indeterminate punctate (approximately 0.8 cm) hypo attenuating nodule within the left lobe of the thyroid. Further evaluation with nonemergent thyroid ultrasound could be performed as clinically indicated. Critical Value/emergent results were called by telephone at the time of interpretation on 01/31/2014 at 9:45 pm to Dr. Veryl Speak, who verbally  acknowledged these results.   Electronically Signed   By: Sandi Mariscal M.D.   On: 01/31/2014 21:51    EKG: Independently reviewed. Normal sinus rhythm with nonspecific T-wave changes.  Assessment/Plan Principal Problem:   PE (pulmonary embolism) Active Problems:   Pulmonary embolism   1. Acute pulmonary embolism - hemodynamically stable. Unprovoked. I have discussed with on-call pulmonary critical care Dr. Chase Caller. At this time pulmonary critical care is recommended IV heparin infusion. Selective cardiac markers. Check 2-D echo. Check Dopplers of the lower extremity. Further recommendations per pulmonologist. 2. Thyroid nodule - will need further workup as outpatient. Check thyroid function test while inpatient. 3. History of ulcerative colitis in remission.    Code Status: Full code.  Family Communication: Patient's family at the bedside. Disposition Plan: Admit to inpatient.    Lauralye Kinn N. [facility] Pager [phone].  If 7PM-7AM, please contact night-coverage [email or web address] Password [email or web address] 01/31/2014, 10:59 PM

## 2014-01-31 NOTE — ED Notes (Addendum)
Patient C/O DOE for three weeks shortly after starting weight training.  States that his shortness of breath has not improved nor has it worsened over the three weeks.  C/O tachycardia over the weekend. Saw his PCP who wanted him to get a CTA to R/O PE. Due to having an elevated d-dimer
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
## 2014-01-31 NOTE — Progress Notes (Signed)
ANTICOAGULATION CONSULT NOTE - Initial Consult  Pharmacy Consult for Heparin Indication: pulmonary embolus  Allergies  Allergen Reactions  . Metronidazole Other (See Comments)    REACTION: Fatigue    Patient Measurements: Height: 5' 10.08" (178 cm) IBW/kg (Calculated) : 73.18 Heparin Dosing Weight: 95 kg  Vital Signs: Temp: 97.6 F (36.4 C) (11/30 1830) BP: 143/92 mmHg (11/30 1830) Pulse Rate: 89 (11/30 1830)  Labs:  Recent Labs  01/31/14 1226  HGB 16.0  HCT 48.3  PLT 226.0  CREATININE 1.0    Estimated Creatinine Clearance: 109 mL/min (by C-G formula based on Cr of 1).   Medical History: Past Medical History  Diagnosis Date  . Ulcerative colitis, unspecified   . Depression   . Hypogonadism male     Medications:  Testosterone  Assessment: 48 yo male with submassive PE for heparin.  Received Lovenox 100 mg SQ at 2215  Goal of Therapy:  Heparin level 0.3-0.7 units/ml Monitor platelets by anticoagulation protocol: Yes   Plan:  Start heparin 1500 units/hr at 0600 tomorrow Check heparin level in 8 hours.   [place] Jeff, Bronson Curb 01/31/2014,11:19 PM

## 2014-02-01 ENCOUNTER — Inpatient Hospital Stay (HOSPITAL_COMMUNITY): Payer: BC Managed Care – PPO

## 2014-02-01 DIAGNOSIS — R0600 Dyspnea, unspecified: Secondary | ICD-10-CM | POA: Diagnosis present

## 2014-02-01 DIAGNOSIS — R0602 Shortness of breath: Secondary | ICD-10-CM | POA: Insufficient documentation

## 2014-02-01 DIAGNOSIS — K51919 Ulcerative colitis, unspecified with unspecified complications: Secondary | ICD-10-CM

## 2014-02-01 DIAGNOSIS — E041 Nontoxic single thyroid nodule: Secondary | ICD-10-CM | POA: Diagnosis present

## 2014-02-01 DIAGNOSIS — R0609 Other forms of dyspnea: Secondary | ICD-10-CM | POA: Diagnosis present

## 2014-02-01 DIAGNOSIS — I2699 Other pulmonary embolism without acute cor pulmonale: Secondary | ICD-10-CM

## 2014-02-01 LAB — COMPREHENSIVE METABOLIC PANEL
ALK PHOS: 79 U/L (ref 39–117)
ALT: 27 U/L (ref 0–53)
ANION GAP: 14 (ref 5–15)
AST: 21 U/L (ref 0–37)
Albumin: 3.6 g/dL (ref 3.5–5.2)
BILIRUBIN TOTAL: 0.4 mg/dL (ref 0.3–1.2)
BUN: 16 mg/dL (ref 6–23)
CO2: 24 mEq/L (ref 19–32)
Calcium: 8.9 mg/dL (ref 8.4–10.5)
Chloride: 102 mEq/L (ref 96–112)
Creatinine, Ser: 0.87 mg/dL (ref 0.50–1.35)
GFR calc non Af Amer: >90 mL/min (ref >90)
GLUCOSE: 95 mg/dL (ref 70–99)
POTASSIUM: 4.5 meq/L (ref 3.7–5.3)
Sodium: 140 mEq/L (ref 137–147)
TOTAL PROTEIN: 6.9 g/dL (ref 6.0–8.3)

## 2014-02-01 LAB — CBC WITH DIFFERENTIAL/PLATELET
BASOS PCT: 0 % (ref 0–1)
Basophils Absolute: 0 10*3/uL (ref 0.0–0.1)
EOS ABS: 0.4 10*3/uL (ref 0.0–0.7)
Eosinophils Relative: 6 % — ABNORMAL HIGH (ref 0–5)
HCT: 43.5 % (ref 39.0–52.0)
Hemoglobin: 14.7 g/dL (ref 13.0–17.0)
Lymphocytes Relative: 47 % — ABNORMAL HIGH (ref 12–46)
Lymphs Abs: 3.2 10*3/uL (ref 0.7–4.0)
MCH: 29.8 pg (ref 26.0–34.0)
MCHC: 33.8 g/dL (ref 30.0–36.0)
MCV: 88.2 fL (ref 78.0–100.0)
MONOS PCT: 9 % (ref 3–12)
Monocytes Absolute: 0.6 10*3/uL (ref 0.1–1.0)
NEUTROS ABS: 2.6 10*3/uL (ref 1.7–7.7)
NEUTROS PCT: 38 % — AB (ref 43–77)
Platelets: 195 10*3/uL (ref 150–400)
RBC: 4.93 MIL/uL (ref 4.22–5.81)
RDW: 13.8 % (ref 11.5–15.5)
WBC: 6.8 10*3/uL (ref 4.0–10.5)

## 2014-02-01 LAB — MRSA PCR SCREENING: MRSA by PCR: NEGATIVE

## 2014-02-01 LAB — TROPONIN I
Troponin I: <0.3 ng/mL (ref <0.30)
Troponin I: <0.3 ng/mL (ref <0.30)

## 2014-02-01 LAB — TSH: TSH: 2 u[IU]/mL (ref 0.350–4.500)

## 2014-02-01 LAB — T4, FREE: FREE T4: 1.42 ng/dL (ref 0.80–1.80)

## 2014-02-01 LAB — T3, FREE: T3 FREE: 3 pg/mL (ref 2.3–4.2)

## 2014-02-01 MED ORDER — INFLUENZA VAC SPLIT QUAD 0.5 ML IM SUSY
0.5000 mL | PREFILLED_SYRINGE | INTRAMUSCULAR | Status: AC
  Administered 2014-02-02: 0.5 mL via INTRAMUSCULAR
  Filled 2014-02-01: qty 0.5

## 2014-02-01 MED ORDER — RIVAROXABAN 15 MG PO TABS
15.0000 mg | ORAL_TABLET | Freq: Two times a day (BID) | ORAL | Status: DC
Start: 2014-02-01 — End: 2014-02-02
  Administered 2014-02-01 – 2014-02-02 (×2): 15 mg via ORAL
  Filled 2014-02-01 (×5): qty 1

## 2014-02-01 MED ORDER — SODIUM CHLORIDE 0.9 % IV SOLN
INTRAVENOUS | Status: AC
  Administered 2014-02-01: via INTRAVENOUS

## 2014-02-01 MED ORDER — RIVAROXABAN (XARELTO) EDUCATION KIT FOR DVT/PE PATIENTS
PACK | Freq: Once | Status: AC
  Administered 2014-02-01: 12:00:00
  Filled 2014-02-01: qty 1

## 2014-02-01 MED ORDER — ONDANSETRON HCL 4 MG PO TABS: 4.0000 mg | ORAL_TABLET | Freq: Four times a day (QID) | ORAL | Status: DC | PRN

## 2014-02-01 MED ORDER — ACETAMINOPHEN 650 MG RE SUPP: 650.0000 mg | Freq: Four times a day (QID) | RECTAL | Status: DC | PRN

## 2014-02-01 MED ORDER — ONDANSETRON HCL 4 MG/2ML IJ SOLN: 4.0000 mg | Freq: Four times a day (QID) | INTRAMUSCULAR | Status: DC | PRN

## 2014-02-01 MED ORDER — ACETAMINOPHEN 325 MG PO TABS: 650.0000 mg | ORAL_TABLET | Freq: Four times a day (QID) | ORAL | Status: DC | PRN

## 2014-02-01 NOTE — Plan of Care (Signed)
Problem: Consults Goal: General Medical Patient Education See Patient Education Module for specific education. Outcome: Progressing Goal: Skin Care Protocol Initiated - if Braden Score 18 or less If consults are not indicated, leave blank or document N/A Outcome: Completed/Met Date Met:  02/01/14 Goal: Nutrition Consult-if indicated Outcome: Not Applicable Date Met:  16/94/50 Goal: Diabetes Guidelines if Diabetic/Glucose > 140 If diabetic or lab glucose is > 140 mg/dl - Initiate Diabetes/Hyperglycemia Guidelines & Document Interventions  Outcome: Not Applicable Date Met:  38/88/28  Problem: Phase I Progression Outcomes Goal: Pain controlled with appropriate interventions Outcome: Completed/Met Date Met:  02/01/14 Goal: OOB as tolerated unless otherwise ordered Outcome: Progressing Patient has dyspnea upon exertion.

## 2014-02-01 NOTE — Discharge Instructions (Addendum)
Information on my medicine - XARELTO (rivaroxaban)  This medication education was reviewed with me or my healthcare representative as part of my discharge preparation.  The pharmacist that spoke with me during my hospital stay was:  [place], [place]? 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  What do you need to know about Xarelto? The starting dose is one 15 mg tablet taken TWICE daily with food for the FIRST 21 DAYS then on (enter date)  December 23rd, 2015  the dose is changed to one 20 mg tablet taken ONCE A DAY with your evening meal.  DO NOT stop taking Xarelto without talking to the health care provider who prescribed the medication.  Refill your prescription for 20 mg tablets before you run out.  After discharge, you should have regular check-up appointments with your healthcare provider that is prescribing your Xarelto.  In the future your dose may need to be changed if your kidney function changes by a significant amount.  What do you do if you miss a dose? If you are taking Xarelto TWICE DAILY and you miss a dose, take it as soon as you remember. You may take two 15 mg tablets (total 30 mg) at the same time then resume your regularly scheduled 15 mg twice daily the next day.  If you are taking Xarelto ONCE DAILY and you miss a dose, take it as soon as you remember on the same day then continue your regularly scheduled once daily regimen the next day. Do not take two doses of Xarelto at the same time.   Important Safety Information Xarelto is a blood thinner medicine that can cause bleeding. You should call your healthcare provider right away if you experience any of the following: ? Bleeding from an injury or your nose that does not stop. ? Unusual colored  urine (red or dark brown) or unusual colored stools (red or black). ? Unusual bruising for unknown reasons. ? A serious fall or if you hit your head (even if there is no bleeding).  Some medicines may interact with Xarelto and might increase your risk of bleeding while on Xarelto. To help avoid this, consult your healthcare provider or pharmacist prior to using any new prescription or non-prescription medications, including herbals, vitamins, non-steroidal anti-inflammatory drugs (NSAIDs) and supplements.  This website has more information on Xarelto: [email or web address] Testosterone This test is used to determine if your testosterone level is abnormal. This could be used to explain difficulty getting an erection (erectile dysfunction), inability of your partner to get pregnant (infertility), premature or delayed puberty if you are male, or the appearance of masculine physical features if you are male. PREPARATION FOR TEST A blood sample is obtained by inserting a needle into a vein in the arm. NORMAL FINDINGS  Free Testosterone: 0.3-2 pg/mL  % Free Testosterone: 0.1%-0.3% Total Testosterone:  7 mos-9 yrs (Tanner Stage I)  Male: Less than 30 ng/dL  Male: Less than 30 ng/dL  10-13 yrs (Tanner Stage II)  Male: Less than 300 ng/dL  Male: Less than 40 ng/dL  14-15 yrs (Tanner Stage III)  Male: 170-540 ng/dL  Male: Less than 60 ng/dL  16-19 yrs (Tanner Stage IV, V)  Male: 250-910 ng/dL  Male: Less than 70 ng/dL  20 yrs and over  Male: [phone] ng/dL  Male: Less than 70 ng/dL Ranges for normal findings may vary among different laboratories and hospitals. You should always check with your doctor after having lab work or other tests done to discuss the meaning of your test results and whether your values are considered within normal limits. MEANING OF TEST  Your caregiver will go over the test results with you and discuss the importance and meaning of your  results, as well as treatment options and the need for additional tests if necessary. OBTAINING THE TEST RESULTS It is your responsibility to obtain your test results. Ask the lab or department performing the test when and how you will get your results. Document Released: 03/07/2004 Document Revised: 05/13/2011 Document Reviewed: 06/16/2013 [facility] Patient Information 2015 [place], [place]. This information is not intended to replace advice given to you by your health care provider. Make sure you discuss any questions you have with your health care provider. Pulmonary Embolism A pulmonary (lung) embolism (PE) is a blood clot that has traveled to the lung and results in a blockage of blood flow in the affected lung. Most clots come from deep veins in the legs or pelvis. PE is a dangerous and potentially life-threatening condition that can be treated if identified. CAUSES Blood clots form in a vein for different reasons. Usually several things cause blood clots. They include:  The flow of blood slows down.  The inside of the vein is damaged in some way.  The person has a condition that makes the blood clot more easily. RISK FACTORS Some people are more likely than others to develop PE. Risk factors include:   Smoking.  Being overweight (obese).  Sitting or lying still for a long time. This includes long-distance travel, paralysis, or recovery from an illness or surgery. Other factors that increase risk are:   Older age, especially over 59 years of age.  Having a family history of blood clots or if you have already had a blood clot.  Having major or lengthy surgery. This is especially true for surgery on the hip, knee, or belly (abdomen). Hip surgery is particularly high risk.  Having a long, thin tube (catheter) placed inside a vein during a medical procedure.  Breaking a hip or leg.  Having cancer or cancer treatment.  Medicines containing the male hormone estrogen. This  includes birth control pills and hormone replacement therapy.  Other circulation or heart problems.  Pregnancy and childbirth.  Hormone changes make the blood clot more easily during pregnancy.  The fetus puts pressure on the veins of the pelvis.  There is a risk of injury to veins during delivery or a caesarean delivery. The risk is highest just after childbirth.  PREVENTION   Exercise the legs regularly. Take a brisk 30 minute walk every day.  Maintain a weight that is appropriate for your height.  Avoid sitting or lying in bed for long periods of time without moving your legs.  Women, particularly those over the age of 61 years, should consider the risks and benefits of taking estrogen medicines, including birth control pills.  Do not smoke, especially if you take estrogen medicines.  Long-distance travel can increase your risk. You should exercise your legs by walking or pumping the muscles every hour.  Many of the risk factors above relate to situations that exist with hospitalization, either for illness, injury, or elective surgery. Prevention may include medical and nonmedical measures.   Your health care provider will assess you for the need  for venous thromboembolism prevention when you are admitted to the hospital. If you are having surgery, your surgeon will assess you the day of or day after surgery.  SYMPTOMS  The symptoms of a PE usually start suddenly and include:  Shortness of breath.  Coughing.  Coughing up blood or blood-tinged mucus.  Chest pain. Pain is often worse with deep breaths.  Rapid heartbeat. DIAGNOSIS  If a PE is suspected, your health care provider will take a medical history and perform a physical exam. Other tests that may be required include:  Blood tests, such as studies of the clotting properties of your blood.  Imaging tests, such as ultrasound, CT, MRI, and other tests to see if you have clots in your legs or lungs.  An  electrocardiogram. This can look for heart strain from blood clots in the lungs. TREATMENT   The most common treatment for a PE is blood thinning (anticoagulant) medicine, which reduces the blood's tendency to clot. Anticoagulants can stop new blood clots from forming and old clots from growing. They cannot dissolve existing clots. Your body does this by itself over time. Anticoagulants can be given by mouth, through an intravenous (IV) tube, or by injection. Your health care provider will determine the best program for you.  Less commonly, clot-dissolving medicines (thrombolytics) are used to dissolve a PE. They carry a high risk of bleeding, so they are used mainly in severe cases.  Very rarely, a blood clot in the leg needs to be removed surgically.  If you are unable to take anticoagulants, your health care provider may arrange for you to have a filter placed in a main vein in your abdomen. This filter prevents clots from traveling to your lungs. HOME CARE INSTRUCTIONS   Take all medicines as directed by your health care provider.  Learn as much as you can about DVT.  Wear a medical alert bracelet or carry a medical alert card.  Ask your health care provider how soon you can go back to normal activities. It is important to stay active to prevent blood clots. If you are on anticoagulant medicine, avoid contact sports.  It is very important to exercise. This is especially important while traveling, sitting, or standing for long periods of time. Exercise your legs by walking or by tightening and relaxing your leg muscles regularly. Take frequent walks.  You may need to wear compression stockings. These are tight elastic stockings that apply pressure to the lower legs. This pressure can help keep the blood in the legs from clotting. Taking Warfarin Warfarin is a daily medicine that is taken by mouth. Your health care provider will advise you on the length of treatment (usually 3-6 months,  sometimes lifelong). If you take warfarin:  Understand how to take warfarin and foods that can affect how warfarin works in Veterinary surgeon.  Too much and too little warfarin are both dangerous. Too much warfarin increases the risk of bleeding. Too little warfarin continues to allow the risk for blood clots. Warfarin and Regular Blood Testing While taking warfarin, you will need to have regular blood tests to measure your blood clotting time. These blood tests usually include both the prothrombin time (PT) and international normalized ratio (INR) tests. The PT and INR results allow your health care provider to adjust your dose of warfarin. It is very important that you have your PT and INR tested as often as directed by your health care provider.  Warfarin and Your Diet Avoid major changes  in your diet, or notify your health care provider before changing your diet. Arrange a visit with a registered dietitian to answer your questions. Many foods, especially foods high in vitamin K, can interfere with warfarin and affect the PT and INR results. You should eat a consistent amount of foods high in vitamin K. Foods high in vitamin K include:   Spinach, kale, broccoli, cabbage, collard and turnip greens, Brussels sprouts, peas, cauliflower, seaweed, and parsley.  Beef and pork liver.  Green tea.  Soybean oil. Warfarin with Other Medicines Many medicines can interfere with warfarin and affect the PT and INR results. You must:  Tell your health care provider about any and all medicines, vitamins, and supplements you take, including aspirin and other over-the-counter anti-inflammatory medicines. Be especially cautious with aspirin and anti-inflammatory medicines. Ask your health care provider before taking these.  Do not take or discontinue any prescribed or over-the-counter medicine except on the advice of your health care provider or pharmacist. Warfarin Side Effects Warfarin can have side effects, such  as easy bruising and difficulty stopping bleeding. Ask your health care provider or pharmacist about other side effects of warfarin. You will need to:  Hold pressure over cuts for longer than usual.  Notify your dentist and other health care providers that you are taking warfarin before you undergo any procedures where bleeding may occur. Warfarin with Alcohol and Tobacco   Drinking alcohol frequently can increase the effect of warfarin, leading to excess bleeding. It is best to avoid alcoholic drinks or consume only very small amounts while taking warfarin. Notify your health care provider if you change your alcohol intake.  Do not use any tobacco products including cigarettes, chewing tobacco, or electronic cigarettes. If you smoke, quit. Ask your health care provider for help with quitting smoking. Alternative Medicines to Warfarin: Factor Xa Inhibitor Medicines  These blood thinning medicines are taken by mouth, usually for several weeks or longer. It is important to take the medicine every single day, at the same time each day.  There are no regular blood tests required when using these medicines.  There are fewer food and drug interactions than with warfarin.  The side effects of this class of medicine is similar to that of warfarin, including excessive bruising or bleeding. Ask your health care provider or pharmacist about other potential side effects. SEEK MEDICAL CARE IF:   You notice a rapid heartbeat.  You feel weaker or more tired than usual.  You feel faint.  You notice increased bruising.  Your symptoms are not getting better in the time expected.  You are having side effects of medicine. SEEK IMMEDIATE MEDICAL CARE IF:   You have chest pain.  You have trouble breathing.  You have new or increased swelling or pain in one leg.  You cough up blood.  You notice blood in vomit, in a bowel movement, or in urine.  You have a fever. Symptoms of PE may represent a  serious problem that is an emergency. Do not wait to see if the symptoms will go away. Get medical help right away. Call your local emergency services (911 in the [place]). Do not drive yourself to the hospital. Document Released: 02/16/2000 Document Revised: 07/05/2013 Document Reviewed: 03/01/2013 [facility] Patient Information 2015 [place], [place]. This information is not intended to replace advice given to you by your health care provider. Make sure you discuss any questions you have with your health care provider. Deep Vein Thrombosis A deep vein thrombosis (  DVT) is a blood clot that develops in the deep, larger veins of the leg, arm, or pelvis. These are more dangerous than clots that might form in veins near the surface of the body. A DVT can lead to serious and even life-threatening complications if the clot breaks off and travels in the bloodstream to the lungs.  A DVT can damage the valves in your leg veins so that instead of flowing upward, the blood pools in the lower leg. This is called post-thrombotic syndrome, and it can result in pain, swelling, discoloration, and sores on the leg. CAUSES Usually, several things contribute to the formation of blood clots. Contributing factors include:  The flow of blood slows down.  The inside of the vein is damaged in some way.  You have a condition that makes blood clot more easily. RISK FACTORS Some people are more likely than others to develop blood clots. Risk factors include:   Smoking.  Being overweight (obese).  Sitting or lying still for a long time. This includes long-distance travel, paralysis, or recovery from an illness or surgery. Other factors that increase risk are:   Older age, especially over 70 years of age.  Having a family history of blood clots or if you have already had a blot clot.  Having major or lengthy surgery. This is especially true for surgery on the hip, knee, or belly (abdomen). Hip surgery is  particularly high risk.  Having a long, thin tube (catheter) placed inside a vein during a medical procedure.  Breaking a hip or leg.  Having cancer or cancer treatment.  Pregnancy and childbirth.  Hormone changes make the blood clot more easily during pregnancy.  The fetus puts pressure on the veins of the pelvis.  There is a risk of injury to veins during delivery or a caesarean delivery. The risk is highest just after childbirth.  Medicines containing the male hormone estrogen. This includes birth control pills and hormone replacement therapy.  Other circulation or heart problems.  SIGNS AND SYMPTOMS When a clot forms, it can either partially or totally block the blood flow in that vein. Symptoms of a DVT can include:  Swelling of the leg or arm, especially if one side is much worse.  Warmth and redness of the leg or arm, especially if one side is much worse.  Pain in an arm or leg. If the clot is in the leg, symptoms may be more noticeable or worse when standing or walking. The symptoms of a DVT that has traveled to the lungs (pulmonary embolism, PE) usually start suddenly and include:  Shortness of breath.  Coughing.  Coughing up blood or blood-tinged mucus.  Chest pain. The chest pain is often worse with deep breaths.  Rapid heartbeat. Anyone with these symptoms should get emergency medical treatment right away. Do not wait to see if the symptoms will go away. Call your local emergency services (911 in the [place] if you have these symptoms. Do not drive yourself to the hospital. DIAGNOSIS If a DVT is suspected, your health care provider will take a full medical history and perform a physical exam. Tests that also may be required include:  Blood tests, including studies of the clotting properties of the blood.  Ultrasound to see if you have clots in your legs or lungs.  X-rays to show the flow of blood when dye is injected into the veins (venogram).  Studies of  your lungs if you have any chest symptoms. PREVENTION  Exercise the  legs regularly. Take a brisk 30-minute walk every day.  Maintain a weight that is appropriate for your height.  Avoid sitting or lying in bed for long periods of time without moving your legs.  Women, particularly those over the age of 35 years, should consider the risks and benefits of taking estrogen medicines, including birth control pills.  Do not smoke, especially if you take estrogen medicines.  Long-distance travel can increase your risk of DVT. You should exercise your legs by walking or pumping the muscles every hour.  Many of the risk factors above relate to situations that exist with hospitalization, either for illness, injury, or elective surgery. Prevention may include medical and nonmedical measures.  Your health care provider will assess you for the need for venous thromboembolism prevention when you are admitted to the hospital. If you are having surgery, your surgeon will assess you the day of or day after surgery. TREATMENT Once identified, a DVT can be treated. It can also be prevented in some circumstances. Once you have had a DVT, you may be at increased risk for a DVT in the future. The most common treatment for DVT is blood-thinning (anticoagulant) medicine, which reduces the blood's tendency to clot. Anticoagulants can stop new blood clots from forming and stop old clots from growing. They cannot dissolve existing clots. Your body does this by itself over time. Anticoagulants can be given by mouth, through an IV tube, or by injection. Your health care provider will determine the best program for you. Other medicines or treatments that may be used are:  Heparin or related medicines (low molecular weight heparin) are often the first treatment for a blood clot. They act quickly. However, they cannot be taken orally and must be given either in shot form or by IV tube.  Heparin can cause a fall in a  component of blood that stops bleeding and forms blood clots (platelets). You will be monitored with blood tests to be sure this does not occur.  Warfarin is an anticoagulant that can be swallowed. It takes a few days to start working, so usually heparin or related medicines are used in combination. Once warfarin is working, heparin is usually stopped.  Factor Xa inhibitor medicines, such as rivaroxaban and apixaban, also reduce blood clotting. These medicines are taken orally and can often be used without heparin or related medicines.  Less commonly, clot dissolving drugs (thrombolytics) are used to dissolve a DVT. They carry a high risk of bleeding, so they are used mainly in severe cases where your life or a part of your body is threatened.  Very rarely, a blood clot in the leg needs to be removed surgically.  If you are unable to take anticoagulants, your health care provider may arrange for you to have a filter placed in a main vein in your abdomen. This filter prevents clots from traveling to your lungs. HOME CARE INSTRUCTIONS  Take all medicines as directed by your health care provider.  Learn as much as you can about DVT.  Wear a medical alert bracelet or carry a medical alert card.  Ask your health care provider how soon you can go back to normal activities. It is important to stay active to prevent blood clots. If you are on anticoagulant medicine, avoid contact sports.  It is very important to exercise. This is especially important while traveling, sitting, or standing for long periods of time. Exercise your legs by walking or by tightening and relaxing your leg muscles regularly.  Take frequent walks.  You may need to wear compression stockings. These are tight elastic stockings that apply pressure to the lower legs. This pressure can help keep the blood in the legs from clotting. Taking Warfarin Warfarin is a daily medicine that is taken by mouth. Your health care provider will  advise you on the length of treatment (usually 3-6 months, sometimes lifelong). If you take warfarin:  Understand how to take warfarin and foods that can affect how warfarin works in Veterinary surgeon.  Too much and too little warfarin are both dangerous. Too much warfarin increases the risk of bleeding. Too little warfarin continues to allow the risk for blood clots. Warfarin and Regular Blood Testing While taking warfarin, you will need to have regular blood tests to measure your blood clotting time. These blood tests usually include both the prothrombin time (PT) and international normalized ratio (INR) tests. The PT and INR results allow your health care provider to adjust your dose of warfarin. It is very important that you have your PT and INR tested as often as directed by your health care provider.  Warfarin and Your Diet Avoid major changes in your diet, or notify your health care provider before changing your diet. Arrange a visit with a registered dietitian to answer your questions. Many foods, especially foods high in vitamin K, can interfere with warfarin and affect the PT and INR results. You should eat a consistent amount of foods high in vitamin K. Foods high in vitamin K include:   Spinach, kale, broccoli, cabbage, collard and turnip greens, Brussels sprouts, peas, cauliflower, seaweed, and parsley.  Beef and pork liver.  Green tea.  Soybean oil. Warfarin with Other Medicines Many medicines can interfere with warfarin and affect the PT and INR results. You must:  Tell your health care provider about any and all medicines, vitamins, and supplements you take, including aspirin and other over-the-counter anti-inflammatory medicines. Be especially cautious with aspirin and anti-inflammatory medicines. Ask your health care provider before taking these.  Do not take or discontinue any prescribed or over-the-counter medicine except on the advice of your health care provider or  pharmacist. Warfarin Side Effects Warfarin can have side effects, such as easy bruising and difficulty stopping bleeding. Ask your health care provider or pharmacist about other side effects of warfarin. You will need to:  Hold pressure over cuts for longer than usual.  Notify your dentist and other health care providers that you are taking warfarin before you undergo any procedures where bleeding may occur. Warfarin with Alcohol and Tobacco   Drinking alcohol frequently can increase the effect of warfarin, leading to excess bleeding. It is best to avoid alcoholic drinks or to consume only very small amounts while taking warfarin. Notify your health care provider if you change your alcohol intake.   Do not use any tobacco products including cigarettes, chewing tobacco, or electronic cigarettes. If you smoke, quit. Ask your health care provider for help with quitting smoking. Alternative Medicines to Warfarin: Factor Xa Inhibitor Medicines  These blood-thinning medicines are taken by mouth, usually for several weeks or longer. It is important to take the medicine every single day at the same time each day.  There are no regular blood tests required when using these medicines.  There are fewer food and drug interactions than with warfarin.  The side effects of this class of medicine are similar to those of warfarin, including excessive bruising or bleeding. Ask your health care provider or pharmacist about other  potential side effects. SEEK MEDICAL CARE IF:  You notice a rapid heartbeat.  You feel weaker or more tired than usual.  You feel faint.  You notice increased bruising.  You feel your symptoms are not getting better in the time expected.  You believe you are having side effects of medicine. SEEK IMMEDIATE MEDICAL CARE IF:  You have chest pain.  You have trouble breathing.  You have new or increased swelling or pain in one leg.  You cough up blood.  You notice blood  in vomit, in a bowel movement, or in urine. MAKE SURE YOU:  Understand these instructions.  Will watch your condition.  Will get help right away if you are not doing well or get worse. Document Released: 02/18/2005 Document Revised: 07/05/2013 Document Reviewed: 10/26/2012 [facility] Patient Information 2015 [place], [place]. This information is not intended to replace advice given to you by your health care provider. Make sure you discuss any questions you have with your health care provider.

## 2014-02-01 NOTE — Telephone Encounter (Signed)
noted 

## 2014-02-01 NOTE — Consult Note (Signed)
PULMONARY / CRITICAL CARE MEDICINE   Name: [name] MRN: [id number] DOB: 1965/09/23   PCP Georgetta Haber, MD   ADMISSION DATE:  01/31/2014 CONSULTATION DATE:  01/31/2014   REFERRING MD :  Dr Hal Hope of ER   CHIEF COMPLAINT:  Bilateral Pulmonary Embolism  INITIAL PRESENTATION: Submassive PE  SIGNIFICANT EVENTS: 01/31/2014 - admit   SUBJECTIVE: No events overnight, feels well this AM.  VITAL SIGNS: Temp:  [97.6 F (36.4 C)-98.5 F (36.9 C)] 97.6 F (36.4 C) (12/01 1048) Pulse Rate:  [60-89] 73 (12/01 1048) Resp:  [16-20] 20 (12/01 1048) BP: (119-143)/(67-92) 131/86 mmHg (12/01 1048) SpO2:  [91 %-98 %] 97 % (12/01 1048) Weight:  [85.73 kg (189 lb)] 85.73 kg (189 lb) (12/01 0100) HEMODYNAMICS:   VENTILATOR SETTINGS:   INTAKE / OUTPUT:  Intake/Output Summary (Last 24 hours) at 02/01/14 1153 Last data filed at 02/01/14 0900  Gross per 24 hour  Intake   1075 ml  Output      0 ml  Net   1075 ml    PHYSICAL EXAMINATION: General:  Well-built muscular male in no distress Neuro:  Alert and oriented 3, Glasgow Coma Scale 15, speech normal, moves all 4 extremity is normally HEENT:  Supple neck, no elevated JVP no neck nodes Cardiovascular:  Normal heart sounds regular rate and rhythm normal cardiac Lungs:  Clear to auscultation bilaterally no wheezes no loud P2 Abdomen:  Soft nontender no organomegaly Musculoskeletal:  No cyanosis no clubbing no edema Skin:  Intact and he has tattoos  LABS: PULMONARY No results for input(s): PHART, PCO2ART, PO2ART, HCO3, TCO2, O2SAT in the last 168 hours.  Invalid input(s): PCO2, PO2  CBC  Recent Labs Lab 01/31/14 1226 02/01/14 0515  HGB 16.0 14.7  HCT 48.3 43.5  WBC 7.2 6.8  PLT 226.0 195    COAGULATION No results for input(s): INR in the last 168 hours.  CARDIAC    Recent Labs Lab 01/31/14 2243 02/01/14 0445 02/01/14 0515  TROPONINI <0.30 <0.30 <0.30    Recent Labs Lab 01/31/14 2243   PROBNP 185.4*     CHEMISTRY  Recent Labs Lab 01/31/14 1226 02/01/14 0515  NA 135 140  K 4.6 4.5  CL 100 102  CO2 28 24  GLUCOSE 93 95  BUN 14 16  CREATININE 1.0 0.87  CALCIUM 9.5 8.9   Estimated Creatinine Clearance: 113 mL/min (by C-G formula based on Cr of 0.87).   LIVER  Recent Labs Lab 01/31/14 1226 02/01/14 0515  AST 22 21  ALT 34 27  ALKPHOS 79 79  BILITOT 0.6 0.4  PROT 7.8 6.9  ALBUMIN 4.3 3.6     INFECTIOUS  Recent Labs Lab 01/31/14 2243  LATICACIDVEN 1.6     ENDOCRINE CBG (last 3)  No results for input(s): GLUCAP in the last 72 hours.  IMAGING x48h Dg Chest 2 View  01/31/2014   CLINICAL DATA:  Persistent dyspnea on exertion.  ICD10: R 6 .02  EXAM: CHEST  2 VIEW  COMPARISON:  05/09/2012  FINDINGS: A mild pectus excavatum deformity. Midline trachea. Normal heart size and mediastinal contours. No pleural effusion or pneumothorax. Clear lungs.  IMPRESSION: No acute cardiopulmonary disease.   Electronically Signed   By: Abigail Miyamoto M.D.   On: 01/31/2014 13:30   Ct Angio Chest Pe W/cm &/or Wo Cm  01/31/2014   CLINICAL DATA:  Chest work out 3 weeks ago with persistent chest pain shortness of breath. Evaluate for pulmonary embolism.  EXAM: CT ANGIOGRAPHY  CHEST WITH CONTRAST  TECHNIQUE: Multidetector CT imaging of the chest was performed using the standard protocol during bolus administration of intravenous contrast. Multiplanar CT image reconstructions and MIPs were obtained to evaluate the vascular anatomy.  CONTRAST:  76m OMNIPAQUE IOHEXOL 350 MG/ML SOLN  COMPARISON:  Chest radiograph -01/31/2014  FINDINGS: Vascular Findings:  There is adequate opacification of the pulmonary arterial system with the main pulmonary artery measuring 403 Hounsfield units. There are multiple mixed occlusive and nonocclusive filling defects within the peripheral aspects of the right and left pulmonary arteries with extension to involve nearly all segmental and multiple  distal subsegmental pulmonary arteries of all lobes of the bilateral lungs. Overall clot burden is deemed large in volume.  While there is no definitive enlargement of the caliber the main pulmonary artery (measuring 29 mm in maximal oblique axial dimension - image 43, series 401), there is bowing of the interventricular septum with abnormal right to left ventricular ratio of approximately 0.92 (right ventricle measuring 34 mm, left ventricle measuring approximately 37 mm). There is no CT evidence of pulmonary infarction. There is no reflux of injected contrast into the intrahepatic venous system.  Cardiomegaly. No pericardial effusion. Normal caliber the thoracic aorta. Bovine configuration of the aortic arch. The branch vessels of the aortic arch widely patent throughout their imaged course. No definite thoracic aortic dissection or periaortic stranding.  Review of the MIP images confirms the above findings.   ----------------------------------------------------------------------------------  Nonvascular Findings:  Minimal bibasilar dependent ground-glass atelectasis. No discrete focal airspace opacities. No pleural effusion or pneumothorax. The central pulmonary airways appear widely patent. No discrete pulmonary nodules. No mediastinal, hilar axillary lymphadenopathy.  Limited visualization of the upper abdomen is normal.  No acute or aggressive osseus abnormalities.  Regional soft tissues appear normal. Note is made of a punctate (approximately 0.8 cm) hypo attenuating nodule within the anterior aspect of the left lobe of the thyroid (image 6, series 401).  IMPRESSION: 1. Positive for acute PE with CT evidence of right heart strain (RV/LV Ratio = 0.92) consistent with at least submassive (intermediate risk) PE. The presence of right heart strain has been associated with an increased risk of morbidity and mortality. Consultation with Pulmonary and Critical Care Medicine is recommended. 2. Indeterminate punctate  (approximately 0.8 cm) hypo attenuating nodule within the left lobe of the thyroid. Further evaluation with nonemergent thyroid ultrasound could be performed as clinically indicated. Critical Value/emergent results were called by telephone at the time of interpretation on 01/31/2014 at 9:45 pm to Dr. DVeryl Speak who verbally acknowledged these results.   Electronically Signed   By: JSandi MariscalM.D.   On: 01/31/2014 21:51   ASSESSMENT / PLAN:  PULMONARY  A: Submassive bilateral pulmonary embolism anatomically. But clinically this is very mild. His pulmonary embolism severity index score [PESI) score is 56 and reflects mild pulmonary embolism class I. His 30 day outcome is excellent. Based on this his RV strain is only anatomic and is not physiologic   P:   - Take d-dimer 5.22, troponin <0.3, lactic acid 1.6 no concern for RV strain at this point. - Full anti-coagulation - Further discussion needs to be had with him about Coumadin versus newer anticoagulation therapies and the duration of this treatment, will defer to primary. - I do not think he should take testosterone injections anymore but this needs further on follow-up per primary. - Will need hypercoagulable work up, if negative then anti-coag for 6-9 months, if positive then for life but suspect  this is due to testosterone replacement therapy. - PCCM will sign off, please call back if needed.  Rush Farmer, M.D. [facility] Pulmonary/Critical Care Medicine. Pager: [phone]. After hours pager: [phone].  02/01/2014 11:53 AM

## 2014-02-01 NOTE — Progress Notes (Signed)
ANTICOAGULATION CONSULT NOTE - Follow Up  Pharmacy Consult for Heparin> xarelto Indication: pulmonary embolus  Allergies  Allergen Reactions  . Metronidazole Other (See Comments)    REACTION: Fatigue    Patient Measurements: Height: 5' 8"  (172.7 cm) Weight: 189 lb (85.73 kg) IBW/kg (Calculated) : 68.4 Heparin Dosing Weight: 95 kg  Vital Signs: Temp: 97.6 F (36.4 C) (12/01 1048) Temp Source: Oral (12/01 1048) BP: 131/86 mmHg (12/01 1048) Pulse Rate: 73 (12/01 1048)  Labs:  Recent Labs  01/31/14 1226 01/31/14 2243 02/01/14 0445 02/01/14 0515  HGB 16.0  --   --  14.7  HCT 48.3  --   --  43.5  PLT 226.0  --   --  195  CREATININE 1.0  --   --  0.87  TROPONINI  --  <0.30 <0.30 <0.30    Estimated Creatinine Clearance: 113 mL/min (by C-G formula based on Cr of 0.87).   Medical History: Past Medical History  Diagnosis Date  . Ulcerative colitis, unspecified   . Depression   . Hypogonadism male     Medications:  Testosterone  Assessment: 48 yo male admitted 01/31/2014  with submassive PE.  Pharmacy consulted to dose heparin & transition to xarelto am of 12/1.    PMH:  Ulcerative colitis, hypogonadism (on testosterone PTA), depression.  Coag:  PE, CBC wnl, no bleeding noted  Goal of Therapy:  Heparin level 0.3-0.7 units/ml> Xa inhibition with rivaroxaban. Monitor platelets by anticoagulation protocol: Yes   Plan:  Start Xarelto 15 mg PO bid today x 21 d then change to 20 mg daily DC heparin when first dose of xarelto is given. Periodic CBC  Thank you for allowing pharmacy to be a part of this patients care team.  Rowe Robert Pharm.D., BCPS, AQ-Cardiology Clinical Pharmacist 02/01/2014 11:56 AM Pager: [phone] Phone: [phone]

## 2014-02-01 NOTE — Progress Notes (Signed)
*  PRELIMINARY RESULTS* Vascular Ultrasound Lower extremity venous duplex has been completed.  Preliminary findings: No evidence of DVT in right lower extremity. Positive for DVT in the left popliteal vein and posterior tibial vein. Superficial thrombosis noted in a varicosity of the left shin.  Landry Mellow, RDMS, RVT  02/01/2014, 10:23 AM

## 2014-02-01 NOTE — Care Management Note (Addendum)
    Page 1 of 1   02/01/2014     10:40:21 AM CARE MANAGEMENT NOTE 02/01/2014  Patient:  [name], [name]   Account Number:  [id number]  Date Initiated:  02/01/2014  Documentation initiated by:  Elissa Hefty  Subjective/Objective Assessment:   adm w pul embolus     Action/Plan:   lives alone, pcp dr Benay Pillow   Anticipated DC Date:  02/03/2014   Anticipated DC Plan:  [place]  CM consult  Medication Assistance      Choice offered to / List presented to:             Status of service:   Medicare Important Message given?   (If response is "NO", the following Medicare IM given date fields will be blank) Date Medicare IM given:   Medicare IM given by:   Date Additional Medicare IM given:   Additional Medicare IM given by:    Discharge Disposition:    Per UR Regulation:  Reviewed for med. necessity/level of care/duration of stay  If discussed at Long Length of Stay Meetings, dates discussed:    Comments:  12/1 1038a debbie Darleene Cumpian rn,bsn gave pt 30day free and copay assist card if he has ins there is no copay. pt does have bcbs ins so his copay should be 0.00 for 1 year.

## 2014-02-01 NOTE — Progress Notes (Signed)
Nutrition Brief Note  Patient identified on the Malnutrition Screening Tool (MST) Report  Wt Readings from Last 15 Encounters:  02/01/14 189 lb (85.73 kg)  01/31/14 218 lb (98.884 kg)  05/10/13 216 lb (97.977 kg)  05/03/13 214 lb (97.07 kg)  07/03/12 206 lb 4.8 oz (93.577 kg)  05/11/12 210 lb 15.7 oz (95.7 kg)  03/02/12 219 lb (99.338 kg)  10/28/11 210 lb (95.255 kg)  06/27/11 206 lb (93.441 kg)  05/24/11 216 lb (97.977 kg)  10/01/10 207 lb (93.895 kg)  08/01/10 207 lb (93.895 kg)  12/25/09 200 lb (90.719 kg)  10/13/09 200 lb (90.719 kg)  10/12/08 210 lb (95.255 kg)    Body mass index is 28.74 kg/(m^2). Patient meets criteria for overweight based on current BMI.   Current diet order is regular, patient is consuming approximately 100% of meals at this time. Labs and medications reviewed.   Pt reports that he has been trying to lose weight recently with diet and exercise. He says that he has been eating well both in the hospital and prior to admission.  No nutrition interventions warranted at this time. If nutrition issues arise, please consult RD.   Laurette Schimke MS, RD, LDN

## 2014-02-01 NOTE — Progress Notes (Signed)
  Echocardiogram 2D Echocardiogram has been performed.  [name] 02/01/2014, 2:33 PM

## 2014-02-01 NOTE — Progress Notes (Signed)
[name]  [name] GMW:[id number] DOB: 02-02-1966 DOA: 01/31/2014 PCP: Georgetta Haber, MD   Brief narrative: 48 yo WM PMHx ulcerative colitis in remission as well as hypogonadism on chronic testosterone therapy. Refer to the ER by his primary care physician after the patient presented with symptoms of shortness of breath/dyspnea on exertion. Patient had noticed the symptoms 3 weeks and they had worsened to the point where they were occurring with minimal exertion. He denied chest pain or any constitutional symptoms. Evaluation at his primary care physician's office revealed elevated d-dimer.  In the ER CT angiogram of the chest revealed pulmonary embolism with possible right ventricular strain. Patient was hemodynamically stable and was not requiring oxygen. 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 There is no family or personal history of PE or DVT. The on-call pulmonologist was consulted by the ER physician. Of Horne CT of the chest revealed an incidental finding of an punctate thyroid nodule/left lobe.  HPI/Subjective: Currently denies shortness of breath at rest.  Assessment/Plan: Active Problems:   PE (pulmonary embolism) -Stable on RA -TNI has been normal making RV strain less likely; follow-up echo -Extensive discussion with patient comparing Coumadin to NOACs including risks and benefits of each alternative-prefers NOAC- CM has clarified co-pay for Xarelto -Anticipate minimum 6 months duration anticoagulation -Lower extremity duplex pending -per Pulmonologist: Will need hypercoagulable work up, if negative then anti-coag for 6-9 months, if positive then for life but suspect this is due to testosterone replacement therapy. Horne prior to ordering hypercoagulability study will need to research if NOAC + Lovenox will affect outcome.    Dyspnea on exertion -Check ambulatory sats on room air     Thyroid nodule -TSH normal -Ultrasound neck to better clarify    Hypogonadism in male -On testosterone therapy prior to admission -Given PE may need to reconsider use of this medication-will hold discharge and defer to patient's primary care physician    Ulcerative colitis -Currently in remission   DVT prophylaxis: Xarelto Code Status: Full Family Communication: No family at bedside Disposition Plan/Expected LOS: Transfer to telemetry-anticipate discharge 12/2   Consultants: Pulmonary medicine/Dr. Chase Caller  Procedures: 2-D echocardiogram pending  Lateral lower extremity venous duplex pending  Cultures: None  Antibiotics: None  Objective: Blood pressure 131/86, pulse 73, temperature 97.6 F (36.4 C), temperature source Oral, resp. rate 20, height 5' 8"  (1.727 m), weight 189 lb (85.73 kg), SpO2 97 %.  Intake/Output Summary (Last 24 hours) at 02/01/14 1209 Last data filed at 02/01/14 0900  Gross per 24 hour  Intake   1075 ml  Output      0 ml  Net   1075 ml     Exam: Gen: No acute respiratory distress Chest: Clear to auscultation bilaterally without wheezes, rhonchi or crackles, room air Cardiac: Regular rate and rhythm, S1-S2, no rubs murmurs or gallops, no peripheral edema, no JVD Abdomen: Soft nontender nondistended without obvious hepatosplenomegaly, no ascites Extremities: Symmetrical in appearance without cyanosis, clubbing or effusion  Scheduled Meds:  Scheduled Meds: . [START ON 02/02/2014] Influenza vac split quadrivalent PF  0.5 mL Intramuscular Tomorrow-1000  . rivaroxaban   Does not apply Once  . Rivaroxaban  15 mg Oral BID WC   Continuous Infusions: . sodium chloride 100 mL/hr at 02/01/14 0900    Data Reviewed: Basic Metabolic Panel:  Recent Labs Lab 01/31/14 1226 02/01/14 0515  NA 135 140  K 4.6  4.5  CL 100 102  CO2 28 24  GLUCOSE 93 95  BUN 14 16  CREATININE 1.0 0.87  CALCIUM 9.5 8.9   Liver Function Tests:  Recent Labs Lab  01/31/14 1226 02/01/14 0515  AST 22 21  ALT 34 27  ALKPHOS 79 79  BILITOT 0.6 0.4  PROT 7.8 6.9  ALBUMIN 4.3 3.6   No results for input(s): LIPASE, AMYLASE in the last 168 hours. No results for input(s): AMMONIA in the last 168 hours. CBC:  Recent Labs Lab 01/31/14 1226 02/01/14 0515  WBC 7.2 6.8  NEUTROABS 3.5 2.6  HGB 16.0 14.7  HCT 48.3 43.5  MCV 90.0 88.2  PLT 226.0 195   Cardiac Enzymes:  Recent Labs Lab 01/31/14 2243 02/01/14 0445 02/01/14 0515  TROPONINI <0.30 <0.30 <0.30   BNP (last 3 results)  Recent Labs  01/31/14 2243  PROBNP 185.4*   CBG: No results for input(s): GLUCAP in the last 168 hours.  Recent Results (from the past 240 hour(s))  MRSA PCR Screening     Status: None   Collection Time: 01/31/14 11:56 PM  Result Value Ref Range Status   MRSA by PCR NEGATIVE NEGATIVE Final    Comment:        The GeneXpert MRSA Assay (FDA approved for NASAL specimens only), is one component of a comprehensive MRSA colonization surveillance program. It is not intended to diagnose MRSA infection nor to guide or monitor treatment for MRSA infections.      Studies:  Recent x-ray studies have been reviewed in detail by the Attending Physician  Time spent :      Erin Hearing, [facility] Office  [phone] Pager [phone]   **If unable to reach the above provider after paging please contact the [place] @ [phone]  On-Call/Text Page:      [email or web address]      password [email or web address]  If 7PM-7AM, please contact night-coverage [email or web address] Password [email or web address] 02/01/2014, 12:09 PM   LOS: 1 day   Examined patient and discussed assessment and plan with ANP Ebony Hail. Patient with multiple complex medical problems> 30 minutes spent in direct patient care

## 2014-02-02 ENCOUNTER — Ambulatory Visit: Payer: BC Managed Care – PPO | Admitting: Cardiovascular Disease

## 2014-02-02 MED ORDER — RIVAROXABAN (XARELTO) VTE STARTER PACK (15 & 20 MG): ORAL_TABLET | ORAL | Status: DC

## 2014-02-02 NOTE — Plan of Care (Signed)
Problem: Discharge Progression Outcomes Goal: Activity appropriate for discharge plan Outcome: Completed/Met Date Met:  02/02/14     

## 2014-02-02 NOTE — Plan of Care (Signed)
Problem: Discharge Progression Outcomes Goal: Hemodynamically stable Outcome: Completed/Met Date Met:  02/02/14

## 2014-02-02 NOTE — Discharge Summary (Signed)
Physician Discharge Summary  [name] EQA:[id number] DOB: Jul 18, 1965 DOA: 01/31/2014  PCP: Georgetta Haber, MD  Admit date: 01/31/2014 Discharge date: 02/02/2014  Time spent: 30 minutes  Recommendations for Outpatient Follow-up:  1. Follow up with PCP in 7-10 days -call to arrange appointment 2. Will need hypercoagulable work up in 6 months after Xarelto dc'd 3. Will need Thyroid US in 12 months to follow thyroid cysts 4. Have discontinued Testosterone at this time due to DVT and PE  Discharge Diagnoses:  Active Problems:   PE (pulmonary embolism) with LLE DVT   Dyspnea on exertion-resolved   Bilateral cysticThyroid nodules-radiologist recommends follow up imaging in 12 months   Hypogonadism in male -STOP TESTOSTERONE   Ulcerative colitis   Discharge Condition: stable  Diet recommendation: Regular  Filed Weights   02/01/14 0100  Weight: 189 lb (85.73 kg)    History of present illness:  48 yo WM PMHx ulcerative colitis in remission as well as hypogonadism on chronic testosterone therapy. Refer to the ER by his primary care physician after the patient presented with symptoms of shortness of breath/dyspnea on exertion. Patient had noticed the symptoms 3 weeks and they had worsened to the point where they were occurring with minimal exertion. He denied chest pain or any constitutional symptoms. Evaluation at his primary care physician's office revealed elevated d-dimer.  In the ER CT angiogram of the chest revealed pulmonary embolism with possible right ventricular strain. Patient was hemodynamically stable and was not requiring oxygen. 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 There is no family or personal history of PE or DVT. The on-call pulmonologist was consulted by the ER physician. Of note CT of the chest revealed an incidental finding of an punctate thyroid nodule/left lobe.  Hospital Course:  PE (pulmonary  embolism) -Stable on RA -TNI was normal making RV strain less likely; ECHO without evidence of RV strain -Extensive discussion with patient comparing Coumadin to NOACs including risks and benefits of each alternative-prefers NOAC- CM has clarified co-pay for Xarelto -Anticipate minimum 6 months duration anticoagulation -Lower extremity duplex with LLE DVT involving popliteal and posterior tibial veins; also with superficial thrombosis left shin -per Pulmonologist: Will need hypercoagulable work up, if negative then anti-coag for 6-9 months, if positive then for life but suspect this is due to testosterone replacement therapy.  -Since anti coagulation started at time of admission hypercoagulable work up will need to be deferred until after completion of Xarelto in 6 months -highly suspected that testosterone primary contributing factor in development of DVT therefore we have stopped the testosterone; also recent prolonged car trip contributed.   Dyspnea on exertion -resolved   Thyroid nodules -TSH normal -Ultrasound neck revealed small bilateral cystic nodules that did not meet current consensus criteria for biopsy. The radiologist recommended follow up US in 12 months.   Hypogonadism in male -On testosterone therapy prior to admission -Given PE may need to reconsider use of this medication-have stopped and will defer to patient's primary care physician as to if/when safe to resume   Ulcerative colitis -Currently in remission  Procedures: 2-D echocardiogram:  - Left ventricle: The cavity size was normal. Wall thickness was normal. Systolic function was normal. The estimated ejection fraction was in the range of 55% to 60%. Wall motion was normal; there were no regional wall motion abnormalities.  Right ventricle: The cavity size was normal. Wall thickness was normal. Systolic function was normal. Pulmonic valve:  Structurally normal valve.  Cusp separation was normal.  Doppler: Transvalvular velocity was within the normal range. There was no regurgitation.  Bilateral lower extremity venous duplex: - No evidence of deep vein thrombosis involving the right lower extremity. - Findings consistent with deep vein thrombosis involving the left popliteal vein and left posterial tibial vein. - Superficial thrombosis noted in a varicosity on the left shin.  Consultations:  Pulmonary Medicine/ Dr Chase Caller  Discharge Exam: Danley Danker Vitals:   02/02/14 0442  BP: 105/71  Pulse: 61  Temp: 97.8 F (36.6 C)  Resp: 18   Gen: No acute respiratory distress Chest: Clear to auscultation bilaterally without wheezes, rhonchi or crackles, room air Cardiac: Regular rate and rhythm, S1-S2, no rubs murmurs or gallops, no peripheral edema, no JVD Abdomen: Soft nontender nondistended without obvious hepatosplenomegaly, no ascites Extremities: Symmetrical in appearance without cyanosis, clubbing or effusion    Discharge Instructions You were cared for by a hospitalist during your hospital stay. If you have any questions about your discharge medications or the care you received while you were in the hospital after you are discharged, you can call the unit and asked to speak with the hospitalist on call if the hospitalist that took care of you is not available. Once you are discharged, your primary care physician will handle any further medical issues. Please note that NO REFILLS for any discharge medications will be authorized once you are discharged, as it is imperative that you return to your primary care physician (or establish a relationship with a primary care physician if you do not have one) for your aftercare needs so that they can reassess your need for medications and monitor your lab values.  Discharge Instructions    Call MD for:  extreme fatigue    Complete by:  As directed      Call MD for:  persistant dizziness or light-headedness    Complete by:  As directed       Call MD for:  severe uncontrolled pain    Complete by:  As directed      Call MD for:  temperature >100.4    Complete by:  As directed      Diet general    Complete by:  As directed      Increase activity slowly    Complete by:  As directed           Current Discharge Medication List    START taking these medications   Details  Rivaroxaban (XARELTO STARTER PACK) 15 & 20 MG TBPK Take as directed on package: Start with one 28m tablet by mouth twice a day with food. On Day 22, switch to one 233mtablet once a day with food. Qty: 51 each, Refills: 0      STOP taking these medications     testosterone cypionate (DEPOTESTOTERONE CYPIONATE) 200 MG/ML injection        Allergies  Allergen Reactions  . Metronidazole Other (See Comments)    REACTION: Fatigue   Follow-up Information    Follow up with HUGarret ReddishMD.   Specialty:  Family Medicine   Why:  for 7-10 days post discharge   Contact information:   [id number] [phone]      The results of significant diagnostics from this hospitalization (including imaging, microbiology, ancillary and laboratory) are listed below for reference.    Significant Diagnostic Studies: Dg Chest 2 View  01/31/2014   CLINICAL DATA:  Persistent  dyspnea on exertion.  ICD10: R 6 .02  EXAM: CHEST  2 VIEW  COMPARISON:  05/09/2012  FINDINGS: A mild pectus excavatum deformity. Midline trachea. Normal heart size and mediastinal contours. No pleural effusion or pneumothorax. Clear lungs.  IMPRESSION: No acute cardiopulmonary disease.   Electronically Signed   By: Abigail Miyamoto M.D.   On: 01/31/2014 13:30   Ct Angio Chest Pe W/cm &/or Wo Cm  01/31/2014   CLINICAL DATA:  Chest work out 3 weeks ago with persistent chest pain shortness of breath. Evaluate for pulmonary embolism.  EXAM: CT ANGIOGRAPHY CHEST WITH CONTRAST  TECHNIQUE: Multidetector CT imaging of the chest was performed using the standard  protocol during bolus administration of intravenous contrast. Multiplanar CT image reconstructions and MIPs were obtained to evaluate the vascular anatomy.  CONTRAST:  72m OMNIPAQUE IOHEXOL 350 MG/ML SOLN  COMPARISON:  Chest radiograph -01/31/2014  FINDINGS: Vascular Findings:  There is adequate opacification of the pulmonary arterial system with the main pulmonary artery measuring 403 Hounsfield units. There are multiple mixed occlusive and nonocclusive filling defects within the peripheral aspects of the right and left pulmonary arteries with extension to involve nearly all segmental and multiple distal subsegmental pulmonary arteries of all lobes of the bilateral lungs. Overall clot burden is deemed large in volume.  While there is no definitive enlargement of the caliber the main pulmonary artery (measuring 29 mm in maximal oblique axial dimension - image 43, series 401), there is bowing of the interventricular septum with abnormal right to left ventricular ratio of approximately 0.92 (right ventricle measuring 34 mm, left ventricle measuring approximately 37 mm). There is no CT evidence of pulmonary infarction. There is no reflux of injected contrast into the intrahepatic venous system.  Cardiomegaly. No pericardial effusion. Normal caliber the thoracic aorta. Bovine configuration of the aortic arch. The branch vessels of the aortic arch widely patent throughout their imaged course. No definite thoracic aortic dissection or periaortic stranding.  Review of the MIP images confirms the above findings.   ----------------------------------------------------------------------------------  Nonvascular Findings:  Minimal bibasilar dependent ground-glass atelectasis. No discrete focal airspace opacities. No pleural effusion or pneumothorax. The central pulmonary airways appear widely patent. No discrete pulmonary nodules. No mediastinal, hilar axillary lymphadenopathy.  Limited visualization of the upper abdomen is  normal.  No acute or aggressive osseus abnormalities.  Regional soft tissues appear normal. Note is made of a punctate (approximately 0.8 cm) hypo attenuating nodule within the anterior aspect of the left lobe of the thyroid (image 6, series 401).  IMPRESSION: 1. Positive for acute PE with CT evidence of right heart strain (RV/LV Ratio = 0.92) consistent with at least submassive (intermediate risk) PE. The presence of right heart strain has been associated with an increased risk of morbidity and mortality. Consultation with Pulmonary and Critical Care Medicine is recommended. 2. Indeterminate punctate (approximately 0.8 cm) hypo attenuating nodule within the left lobe of the thyroid. Further evaluation with nonemergent thyroid ultrasound could be performed as clinically indicated. Critical Value/emergent results were called by telephone at the time of interpretation on 01/31/2014 at 9:45 pm to Dr. DVeryl Speak who verbally acknowledged these results.   Electronically Signed   By: JSandi MariscalM.D.   On: 01/31/2014 21:51   [place] Tissue Head/neck  02/01/2014   CLINICAL DATA:  Left thyroid lesion noted on CT chest  EXAM: THYROID ULTRASOUND  TECHNIQUE: Ultrasound examination of the thyroid gland and adjacent soft tissues was performed.  COMPARISON:  CT 01/31/2014  FINDINGS:  Right thyroid lobe  Measurements: 62 x 29 x 22 mm. Multiple small cystic nodules. Largest 8 x 6 x 3 mm, mid lobe. Remainder 7 mm or less.  Left thyroid lobe  Measurements: 62 x 20 x 18 mm. Several complex cysts. Largest 11 x 7 x 9 mm, mid lobe. Remainder 7 mm or less.  Isthmus  Thickness: 4 mm.  There is a 3 x 2 mm cyst to the right of midline.  Lymphadenopathy  None visualized.  IMPRESSION: 1. Thyromegaly with small bilateral cystic nodules. Findings do not meet current consensus criteria for biopsy. Follow-up by clinical exam is recommended. If patient has known risk factors for thyroid carcinoma, consider follow-up ultrasound in 12 months. If  patient is clinically hyperthyroid, consider nuclear medicine thyroid uptake and scan. This recommendation follows the consensus statement: Management of Thyroid Nodules Detected as [place]: Society of Radiologists in [place]. Radiology 2005; [id number].   Electronically Signed   By: Arne Cleveland M.D.   On: 02/01/2014 16:10    Microbiology: Recent Results (from the past 240 hour(s))  MRSA PCR Screening     Status: None   Collection Time: 01/31/14 11:56 PM  Result Value Ref Range Status   MRSA by PCR NEGATIVE NEGATIVE Final    Comment:        The GeneXpert MRSA Assay (FDA approved for NASAL specimens only), is one component of a comprehensive MRSA colonization surveillance program. It is not intended to diagnose MRSA infection nor to guide or monitor treatment for MRSA infections.      Labs: Basic Metabolic Panel:  Recent Labs Lab 01/31/14 1226 02/01/14 0515  NA 135 140  K 4.6 4.5  CL 100 102  CO2 28 24  GLUCOSE 93 95  BUN 14 16  CREATININE 1.0 0.87  CALCIUM 9.5 8.9   Liver Function Tests:  Recent Labs Lab 01/31/14 1226 02/01/14 0515  AST 22 21  ALT 34 27  ALKPHOS 79 79  BILITOT 0.6 0.4  PROT 7.8 6.9  ALBUMIN 4.3 3.6   No results for input(s): LIPASE, AMYLASE in the last 168 hours. No results for input(s): AMMONIA in the last 168 hours. CBC:  Recent Labs Lab 01/31/14 1226 02/01/14 0515  WBC 7.2 6.8  NEUTROABS 3.5 2.6  HGB 16.0 14.7  HCT 48.3 43.5  MCV 90.0 88.2  PLT 226.0 195   Cardiac Enzymes:  Recent Labs Lab 01/31/14 2243 02/01/14 0445 02/01/14 0515 02/01/14 1246  TROPONINI <0.30 <0.30 <0.30 <0.30   BNP: BNP (last 3 results)  Recent Labs  01/31/14 2243  PROBNP 185.4*   CBG: No results for input(s): GLUCAP in the last 168 hours.   I have seen and examined the patient , reviewed plan of care as documented by APP.   Reyne Dumas [phone]       Signed:  ELLIS,ALLISON L. ANP [facility] 02/02/2014, 11:22 AM

## 2014-02-02 NOTE — Plan of Care (Signed)
Problem: Discharge Progression Outcomes Goal: Pain controlled with appropriate interventions Outcome: Completed/Met Date Met:  02/02/14     

## 2014-02-02 NOTE — Plan of Care (Signed)
Problem: Discharge Progression Outcomes Goal: Complications resolved/controlled Outcome: Completed/Met Date Met:  02/02/14     

## 2014-02-02 NOTE — Plan of Care (Signed)
Problem: Discharge Progression Outcomes Goal: Discharge plan in place and appropriate Outcome: Completed/Met Date Met:  02/02/14     

## 2014-02-02 NOTE — Plan of Care (Signed)
Problem: Discharge Progression Outcomes Goal: Tolerating diet Outcome: Completed/Met Date Met:  02/02/14     

## 2014-02-02 NOTE — Plan of Care (Signed)
Problem: Phase I Progression Outcomes Goal: OOB as tolerated unless otherwise ordered Outcome: Completed/Met Date Met:  02/02/14

## 2014-02-02 NOTE — Plan of Care (Signed)
Problem: Discharge Progression Outcomes Goal: Other Discharge Outcomes/Goals Outcome: Completed/Met Date Met:  02/02/14     

## 2014-02-02 NOTE — Plan of Care (Signed)
Problem: Phase II Progression Outcomes Goal: IV changed to normal saline lock Outcome: Completed/Met Date Met:  02/02/14

## 2014-02-02 NOTE — Progress Notes (Signed)
Reviewed discharge instructions with patient, f/u appointments, and prescriptions given. Patient verbalized understanding of instructions. Patient educated on Xarelto and bleeding precautions. IV & Tele were removed. Pt. VSS, pt. Discharged home.

## 2014-02-07 ENCOUNTER — Ambulatory Visit: Payer: BC Managed Care – PPO | Admitting: Family Medicine

## 2014-02-10 ENCOUNTER — Encounter: Payer: Self-pay | Admitting: Family Medicine

## 2014-02-10 ENCOUNTER — Ambulatory Visit (INDEPENDENT_AMBULATORY_CARE_PROVIDER_SITE_OTHER): Payer: BC Managed Care – PPO | Admitting: Family Medicine

## 2014-02-10 VITALS — BP 120/70 | HR 91 | Temp 98.0°F | Wt 218.0 lb

## 2014-02-10 DIAGNOSIS — Z86718 Personal history of other venous thrombosis and embolism: Secondary | ICD-10-CM | POA: Insufficient documentation

## 2014-02-10 DIAGNOSIS — E291 Testicular hypofunction: Secondary | ICD-10-CM

## 2014-02-10 DIAGNOSIS — I2699 Other pulmonary embolism without acute cor pulmonale: Secondary | ICD-10-CM

## 2014-02-10 MED ORDER — RIVAROXABAN 20 MG PO TABS: 20.0000 mg | ORAL_TABLET | Freq: Every day | ORAL | Status: DC

## 2014-02-10 NOTE — Assessment & Plan Note (Signed)
DVT/PE. Planned follow up in 1 month to assure appropriate change on xarelto. Instructed today on 3 week course of 71m BID and change to 255mat day 22. Patient expresses understanding. At 6 months-consider discontinue xarelto and then hypercoag workup in 6 months. This may have been testosterone treatment related as well as provoked by long car rides.

## 2014-02-10 NOTE — Patient Instructions (Signed)
Continue off testosterone  Xarelto  15 mg twice a day for 3 weeks  Then 56m daily for 6 months minimum  See me in 1 month

## 2014-02-10 NOTE — Progress Notes (Signed)
  Garret Reddish, MD Phone: [phone]  Subjective:   [name] is a 48 y.o. year old very pleasant male patient who presents with the following:  DVT/PE hospital follow up.  Thought to be due to travel and testosterone therapy. Apparently had leg pain for months before shortness of breath-may have been left leg DVT that was found. See discharge summary 01/2014. Hospitalized for 2 days. Had right heart strain but thought to be minor by pulmonology  Today, patient reports shortness of breath has improved 50%. Can walk down the hall without any shortness of breath but does get some shortness of breath going up stairs (improved on both counts). Feels fatigued and wonders if from [place]. Just wants to lie around a nap a lot. Also 1 week late for testosterone shots which have been discontinued.   ROS- no chest pain, no orthopnea/PND, no wheeze. No cough.   Past Medical History- Patient Active Problem List   Diagnosis Date Noted  . Pulmonary embolism ? due to testosterone therapy 01/31/2014    Priority: High  . Ulcerative colitis 03/04/1992    Priority: High  . History of DVT (deep vein thrombosis) 02/10/2014    Priority: Medium  . Thyroid nodule 02/01/2014    Priority: Medium  . Hypogonadism in male 01/15/2007    Priority: Medium  . Mandible open fracture 05/09/2012    Priority: Low    Class: Acute  . 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 Dyspnea on exertion 02/01/2014  . Open fracture facial bones 05/09/2012    Class: Acute  . DEPRESSION 01/15/2007  . HYPERLIPIDEMIA 01/09/2007  . 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 Rivaroxaban (XARELTO STARTER PACK) 15 & 20 MG TBPK Take as directed on package: Start with one 17m tablet by mouth twice a day with food. On Day 22, switch to one 244mtablet once a day with food. 51 each 0  .  rivaroxaban (XARELTO) 20 MG TABS tablet Take 1 tablet (20 mg total) by mouth daily with supper. Once you finish starter pack 30 tablet 5   No current facility-administered medications for this visit.    Objective: BP 120/70 mmHg  Pulse 91  Temp(Src) 98 F (36.7 C) (Oral)  Wt 218 lb (98.884 kg)  SpO2 98% Gen: NAD, resting comfortably CV: RRR no murmurs rubs or gallops Lungs: CTAB no crackles, wheeze, rhonchi Abdomen: soft/nontender/nondistended/normal bowel sounds.  Ext: no edema, no calf tenderness Skin: warm, dry, no rash or superficial veins Neuro: grossly normal, moves all extremities   Assessment/Plan:  Pulmonary embolism ? due to testosterone therapy DVT/PE. Planned follow up in 1 month to assure appropriate change on xarelto. Instructed today on 3 week course of 1543mID and change to 60m54m day 22. Patient expresses understanding. At 6 months-consider discontinue xarelto and then hypercoag workup in 6 months. This may have been testosterone treatment related as well as provoked by long car rides.   Strict return precautions advised.   Meds ordered this encounter  Medications  . rivaroxaban (XARELTO) 20 MG TABS tablet    Sig: Take 1 tablet (20 mg total) by mouth daily with supper. Once you finish starter pack    Dispense:  30 tablet    Refill:  5    j

## 2014-02-10 NOTE — Progress Notes (Signed)
Pre visit review using our clinic review tool, if applicable. No additional management support is needed unless otherwise documented below in the visit note. 

## 2014-02-22 ENCOUNTER — Encounter: Payer: Self-pay | Admitting: Cardiovascular Disease

## 2014-03-14 ENCOUNTER — Ambulatory Visit (INDEPENDENT_AMBULATORY_CARE_PROVIDER_SITE_OTHER): Payer: BLUE CROSS/BLUE SHIELD | Admitting: Family Medicine

## 2014-03-14 ENCOUNTER — Encounter: Payer: Self-pay | Admitting: Family Medicine

## 2014-03-14 VITALS — BP 124/90 | HR 83 | Temp 97.7°F | Ht 68.0 in | Wt 220.0 lb

## 2014-03-14 DIAGNOSIS — I2699 Other pulmonary embolism without acute cor pulmonale: Secondary | ICD-10-CM

## 2014-03-14 DIAGNOSIS — Z86718 Personal history of other venous thrombosis and embolism: Secondary | ICD-10-CM

## 2014-03-14 NOTE — Progress Notes (Signed)
Pre visit review using our clinic review tool, if applicable. No additional management support is needed unless otherwise documented below in the visit note. 

## 2014-03-14 NOTE — Patient Instructions (Signed)
Let's see each other back in Late May or early June. Consider at that time coming off of xarelto with plan in place for coagulation workup after coming off of medicine.   Can start back working out but would build up very slowly at like 10 minutes walking to start.   Exercise may help with fatigue.

## 2014-03-14 NOTE — Assessment & Plan Note (Signed)
Patient doing well. Plan given for slow return to exercise. Continue xarelto and follow up late May. We will consider d/c xarelto at 6 months and then do hypercoaguability workup after several months. Continue off testosterone replacement. Hopeful that restarting exercise will help with fatigue.

## 2014-03-14 NOTE — Assessment & Plan Note (Signed)
No calf pain/tenderness. Continue xarelto.

## 2014-03-14 NOTE — Progress Notes (Signed)
  Garret Reddish, MD Phone: [phone]  Subjective:   [name] is a 49 y.o. year old very pleasant male patient who presents with the following:  DVT/PE follow up-thought due to testosterone replacement -3 months off testosterone at this point. Patient's shortness of breath has completely resolved. Goes up steps without difficulty. Compliant with xarelto 54m. COntinues to have fatigue but no recent worsening. Stable since being admitted to hospital. Has not started back on exercise and has questions about this.  ROS- No easy bruising/bleeding. No calf pain or chest pain.   Past Medical History- DVT/PE, ulcerative colitis, hypogonadism, HLD  Medications- reviewed and updated Current Outpatient Prescriptions  Medication Sig Dispense Refill  . rivaroxaban (XARELTO) 20 MG TABS tablet Take 1 tablet (20 mg total) by mouth daily with supper. Once you finish starter pack 30 tablet 5   Objective: BP 124/90 mmHg  Pulse 83  Temp(Src) 97.7 F (36.5 C) (Oral)  Ht 5' 8"  (1.727 m)  Wt 220 lb (99.791 kg)  BMI 33.46 kg/m2 Gen: NAD, resting comfortably CV: RRR no murmurs rubs or gallops Lungs: CTAB no crackles, wheeze, rhonchi Ext: no edema, no calf pain or tenderness Skin: warm, dry, no rash   Assessment/Plan:  History of DVT (deep vein thrombosis) No calf pain/tenderness. Continue xarelto.   Pulmonary embolism ? due to testosterone therapy Patient doing well. Plan given for slow return to exercise. Continue xarelto and follow up late May. We will consider d/c xarelto at 6 months and then do hypercoaguability workup after several months. Continue off testosterone replacement. Hopeful that restarting exercise will help with fatigue.   Return precautions advised. Follow up late May. Could consider hypercoagulability workup while on xarelto per pulmonary notes though referencing uptodate mentiones minimum of 2 weeks off medication if possible. I think testing is warranted given age at  diagnosis 89in order to determine duration of therapy. Testing planned antithrombin, protein S and C deficiencies, factor V Leiden and prothrombin gene mutations, antiphospholipid syndrome.  Keep an eye on blood pressure  >50% of 25 minute office visit was spent on counseling (return to exercise, when and how we would test for coagulation disorder and risks, considerations of restarting testosterone-declined) and coordination of care

## 2014-06-07 ENCOUNTER — Ambulatory Visit: Payer: BC Managed Care – PPO | Admitting: Family Medicine

## 2014-08-04 ENCOUNTER — Encounter: Payer: Self-pay | Admitting: Family Medicine

## 2014-08-04 ENCOUNTER — Ambulatory Visit: Payer: BLUE CROSS/BLUE SHIELD | Admitting: Family Medicine

## 2014-08-04 ENCOUNTER — Ambulatory Visit (INDEPENDENT_AMBULATORY_CARE_PROVIDER_SITE_OTHER): Payer: BLUE CROSS/BLUE SHIELD | Admitting: Family Medicine

## 2014-08-04 VITALS — BP 132/88 | HR 67 | Temp 98.9°F | Wt 210.0 lb

## 2014-08-04 DIAGNOSIS — K51919 Ulcerative colitis, unspecified with unspecified complications: Secondary | ICD-10-CM

## 2014-08-04 DIAGNOSIS — E041 Nontoxic single thyroid nodule: Secondary | ICD-10-CM

## 2014-08-04 DIAGNOSIS — Z86718 Personal history of other venous thrombosis and embolism: Secondary | ICD-10-CM

## 2014-08-04 DIAGNOSIS — I2699 Other pulmonary embolism without acute cor pulmonale: Secondary | ICD-10-CM

## 2014-08-04 NOTE — Progress Notes (Signed)
Garret Reddish, MD  Subjective:  [name] is a 49 y.o. year old very pleasant male patient who presents with:  Pulmonary embolism ? due to testosterone therapy- assume resolved DVT- assume resolved Patient still doing well. Taking xarelto daily except for today. Has not restarted testosterone. Pulm in hospital had recommended at least 6 months of therapy and he is at 6 months at this piont  ROS-No chest pain, shortness of breath, calf pain, leg swelling. States does have some mild fatigue on xarelto but could be from being off testosterone.   Past Medical History- ulcerative colitis  Medications- reviewed and updated Current Outpatient Prescriptions  Medication Sig Dispense Refill  . rivaroxaban (XARELTO) 20 MG TABS tablet Take 1 tablet (20 mg total) by mouth daily with supper. Once you finish starter pack 30 tablet 5   Objective: BP 132/88 mmHg  Pulse 67  Temp(Src) 98.9 F (37.2 C)  Wt 210 lb (95.255 kg) Gen: NAD, resting comfortably in chair CV: RRR no murmurs rubs or gallops Lungs: CTAB no crackles, wheeze, rhonchi Abdomen: soft/nontender/nondistended/normal bowel sounds.  Ext: no edema, no calf tenderness Skin: warm, dry, no rash, several tatoos Neuro: grossly normal, moves all extremities   Assessment/Plan:  Ulcerative colitis Advised GI follow up. He declines, will reconsider 6 month follow up.    Pulmonary embolism ? due to testosterone therapy 6 months therapy completed. Patient eager to stop medication. Offered repeat venous duplex but declined. He will watch for symptoms closely. He will remain off medication for 1 month then return for hypercoag panel (completing given right around 47 with symptoms and per pulm recs). Checking with lab to ensure checks for antithrombin, protein S and C deficiencies, factor V Leiden and prothrombin gene mutations, antiphospholipid syndrome. Would need lifelong therapy if positive.     6 month f/u. Strict Return precautions  advised. High risk patient with history DVT/PE with complex decision making.   Orders Placed This Encounter  Procedures  . Hypercoagulable panel, comprehensive    Standing Status: Future     Number of Occurrences:      Standing Expiration Date: 08/04/2015

## 2014-08-04 NOTE — Assessment & Plan Note (Signed)
6 months therapy completed. Patient eager to stop medication. Offered repeat venous duplex but declined. He will watch for symptoms closely. He will remain off medication for 1 month then return for hypercoag panel (completing given right around 47 with symptoms and per pulm recs). Checking with lab to ensure checks for antithrombin, protein S and C deficiencies, factor V Leiden and prothrombin gene mutations, antiphospholipid syndrome. Would need lifelong therapy if positive.

## 2014-08-04 NOTE — Assessment & Plan Note (Signed)
Advised GI follow up. He declines, will reconsider 6 month follow up.

## 2014-08-04 NOTE — Patient Instructions (Signed)
Return in a month for labs- I will enter them later today. If they cannot schedule your lab visit yet, just call back in about a week.   Stop xarelto as of today  Return immediately for any leg swelling, calf pain, shortness of breath, abnormal fatigue

## 2014-08-04 NOTE — Assessment & Plan Note (Signed)
No clinical signs or symptoms. Stop xarelto.

## 2014-08-08 ENCOUNTER — Other Ambulatory Visit: Payer: Self-pay | Admitting: *Deleted

## 2014-08-08 DIAGNOSIS — Z86718 Personal history of other venous thrombosis and embolism: Secondary | ICD-10-CM

## 2014-08-25 ENCOUNTER — Other Ambulatory Visit: Payer: Self-pay | Admitting: Family Medicine

## 2014-10-04 ENCOUNTER — Telehealth: Payer: Self-pay | Admitting: Family Medicine

## 2014-10-04 MED ORDER — SILDENAFIL CITRATE 100 MG PO TABS: 50.0000 mg | ORAL_TABLET | Freq: Every day | ORAL | Status: DC | PRN

## 2014-10-04 NOTE — Telephone Encounter (Signed)
Pt is having side effects from lack of Testerone injection. Is now wondering if you would be willing to Rx Cialis or Viagra for him.

## 2014-10-04 NOTE — Telephone Encounter (Signed)
Sent in rx for viagra. He can look on their website for a coupon I believe.

## 2014-10-04 NOTE — Telephone Encounter (Signed)
See below

## 2014-10-05 NOTE — Telephone Encounter (Signed)
Called and lm on pt vm tcb. 

## 2014-10-05 NOTE — Telephone Encounter (Signed)
Pt cb and aware rx called in. Advised pt may get coupon on line.

## 2014-10-06 NOTE — Telephone Encounter (Signed)
Noted  

## 2014-11-16 ENCOUNTER — Telehealth: Payer: Self-pay | Admitting: Family Medicine

## 2014-11-16 NOTE — Telephone Encounter (Signed)
Pt states his girlfrienc called and states she has an infection and he should like  Med just a  A precaution, the med is Metronidazole.  Recommend 4 pill , all at once. Pt prefers not to come in, but will if need.  He states it is just a precautionary, and he ates to come in if nothing is there  [facility]

## 2014-11-16 NOTE — Telephone Encounter (Signed)
See below, tried contacting patient for clarification because i noticed on his allergy list he is allergic to the medication that he is requesting. Will pt need OV?

## 2014-11-16 NOTE — Telephone Encounter (Signed)
Pt states he will give [place] a call back when he decides if he wants to come in or wait until he has problems

## 2014-11-16 NOTE — Telephone Encounter (Signed)
I would let patient know we have that listed as an allergy- in addition unclear what we are treating so would certainly need more information- most likely does need to come in

## 2015-01-01 ENCOUNTER — Emergency Department (HOSPITAL_COMMUNITY)
Admission: EM | Admit: 2015-01-01 | Discharge: 2015-01-01 | Disposition: A | Discharge status: Home / Self Care | Payer: BLUE CROSS/BLUE SHIELD | Attending: Emergency Medicine | Admitting: Emergency Medicine

## 2015-01-01 ENCOUNTER — Encounter (HOSPITAL_COMMUNITY): Payer: Self-pay | Admitting: *Deleted

## 2015-01-01 ENCOUNTER — Emergency Department (EMERGENCY_DEPARTMENT_HOSPITAL)
Admit: 2015-01-01 | Discharge: 2015-01-01 | Disposition: A | Discharge status: Home / Self Care | Payer: BLUE CROSS/BLUE SHIELD | Attending: Emergency Medicine | Admitting: Emergency Medicine

## 2015-01-01 DIAGNOSIS — Z8659 Personal history of other mental and behavioral disorders: Secondary | ICD-10-CM | POA: Insufficient documentation

## 2015-01-01 DIAGNOSIS — Z7982 Long term (current) use of aspirin: Secondary | ICD-10-CM | POA: Diagnosis not present

## 2015-01-01 DIAGNOSIS — Z8719 Personal history of other diseases of the digestive system: Secondary | ICD-10-CM | POA: Insufficient documentation

## 2015-01-01 DIAGNOSIS — Z8639 Personal history of other endocrine, nutritional and metabolic disease: Secondary | ICD-10-CM | POA: Diagnosis not present

## 2015-01-01 DIAGNOSIS — R52 Pain, unspecified: Secondary | ICD-10-CM

## 2015-01-01 DIAGNOSIS — I8392 Asymptomatic varicose veins of left lower extremity: Secondary | ICD-10-CM | POA: Insufficient documentation

## 2015-01-01 DIAGNOSIS — I83892 Varicose veins of left lower extremities with other complications: Secondary | ICD-10-CM

## 2015-01-01 DIAGNOSIS — R2242 Localized swelling, mass and lump, left lower limb: Secondary | ICD-10-CM | POA: Diagnosis present

## 2015-01-01 DIAGNOSIS — M7989 Other specified soft tissue disorders: Secondary | ICD-10-CM

## 2015-01-01 MED ORDER — ASPIRIN 81 MG PO CHEW: 81.0000 mg | CHEWABLE_TABLET | Freq: Every day | ORAL | Status: DC

## 2015-01-01 NOTE — ED Provider Notes (Signed)
CSN: [id number]     Arrival date & time 01/01/15  1625 History   None    Chief Complaint  Patient presents with  . Leg Pain     HPI Patient presents to the emergency department complaining of pain in his left lower leg.  He feels like he has small varicose veins and knots to his left medial lower leg.  He has a history of DVT is no longer on anticoagulants.  He was on anticoagulants for 6 months after a left-sided DVT in his left lower extremity followed by pulmonary embolus.  He states his symptoms began similarly.  Given the new pain and swelling in his left lower leg he came to the ER for evaluation for possible DVT.  No chest pain or shortness of breath.  No other complaints.  No injury or trauma to the area.  His pain is mild in severity.   Past Medical History  Diagnosis Date  . Ulcerative colitis, unspecified   . Depression   . Hypogonadism male    Past Surgical History  Procedure Laterality Date  . Orif mandibular fracture Left 05/09/2012    Procedure: OPEN REDUCTION INTERNAL FIXATION (ORIF) RIGHT ANTERIOR MANDIBULAR FRACTURE; Open Reduction Internal Fixation of Midface fracture; Reduction of septal fracture; arch bars; Repair of lacerations;  Surgeon: Jerrell Belfast, MD;  Location: [place];  Service: ENT;  Laterality: Left;  . Tracheostomy tube placement N/A 05/09/2012    Procedure: TRACHEOSTOMY;  Surgeon: Jerrell Belfast, MD;  Location: [place];  Service: ENT;  Laterality: N/A;  . Mandibular hardware removal Bilateral 07/10/2012    Procedure: BILATERAL MANDIBULAR HARDWARE REMOVAL;  Surgeon: Jerrell Belfast, MD;  Location: [facility] OR;  Service: ENT;  Laterality: Bilateral;   Family History  Problem Relation Age of Onset  . COPD Father   . Lung cancer     Social History  Substance Use Topics  . Smoking status: Never Smoker   . Smokeless tobacco: Never Used  . Alcohol Use: Yes     Comment: OCCASIONALLY    Review of Systems  All other systems reviewed and are  negative.     Allergies  Metronidazole  Home Medications   Prior to Admission medications   Medication Sig Start Date End Date Taking? Authorizing Provider  aspirin 81 MG chewable tablet Chew 1 tablet (81 mg total) by mouth daily. 01/01/15   Jola Schmidt, MD  sildenafil (VIAGRA) 100 MG tablet Take 0.5-1 tablets (50-100 mg total) by mouth daily as needed for erectile dysfunction. 10/04/14   Marin Olp, MD  XARELTO 20 MG TABS tablet TAKE 1 TABLET BY MOUTH EVERY DAY WITH SUPPER**AFTER YOU FINISH STARTER PACK 08/25/14   Marin Olp, MD   BP 131/93 mmHg  Pulse 76  Temp(Src) 97.8 F (36.6 C) (Oral)  Resp 16  Ht 5' 8"  (1.727 m)  Wt 205 lb 3.2 oz (93.078 kg)  BMI 31.21 kg/m2  SpO2 100% Physical Exam  Constitutional: He is oriented to person, place, and time. He appears well-developed and well-nourished.  HENT:  Head: Normocephalic.  Eyes: EOM are normal.  Neck: Normal range of motion.  Pulmonary/Chest: Effort normal.  Abdominal: He exhibits no distension.  Musculoskeletal: Normal range of motion.  Small varicose veins to the proximal aspect of the left lower leg.  Normal ambulation and range of motion of left lower extremity.  Neurological: He is alert and oriented to person, place, and time.  Psychiatric: He has a normal mood and affect.  Nursing note  and vitals reviewed.   ED Course  Procedures (including critical care time)  Filed: 01/01/2015 6:05 PM Note Time: 01/01/2015 6:04 PM Status: Signed    Editor: Doyne Keel Simonetti (Cardiovascular Sonographer)     Expand All Collapse All   *Preliminary Results* Left lower extremity venous duplex completed. Left lower extremity is negative for deep vein thrombosis. There is evidence of superficial vein thrombosis involving the left greater saphenous vein in the mid calf. There is no evidence of left Baker's cyst.  Preliminary results discussed with Dr.Kylin Dubs.        Labs Review Labs Reviewed - No data to  display  Imaging Review No results found. I have personally reviewed and evaluated these images and lab results as part of my medical decision-making.   EKG Interpretation None      MDM   Final diagnoses:  Left leg swelling  Varicose veins of leg with swelling, left    Patient is no longer on anticoagulants.  Patient be placed on 81 mg aspirin.  Ultrasound demonstrates no evidence of DVT at this time.  Patient will need a repeat duplex in 1 week to reevaluate.  He's been recommended to come to the ER for any new or worsening symptoms including worsening swelling of his left lower extremity or development of chest pain or shortness of breath.  In the meantime I recommended elevation and warm compresses.  I recommended that he wear compression stockings for what appears to be thrombosed varicose veins    Jola Schmidt, MD 01/01/15 2314

## 2015-01-01 NOTE — Progress Notes (Signed)
*  Preliminary Results* Left lower extremity venous duplex completed. Left lower extremity is negative for deep vein thrombosis. There is evidence of superficial vein thrombosis involving the left greater saphenous vein in the mid calf. There is no evidence of left Baker's cyst.  Preliminary results discussed with Dr.Campos.  01/01/2015 6:04 PM  Maudry Mayhew, RVT, RDCS, RDMS

## 2015-01-01 NOTE — ED Notes (Signed)
Pt is here with left lower leg pain and area of knots.  Pt has history of DVT.  LDPP palpable

## 2015-01-02 ENCOUNTER — Telehealth: Payer: Self-pay | Admitting: Family Medicine

## 2015-01-02 DIAGNOSIS — Z86718 Personal history of other venous thrombosis and embolism: Secondary | ICD-10-CM

## 2015-01-02 NOTE — Telephone Encounter (Signed)
Order placed

## 2015-01-02 NOTE — Telephone Encounter (Signed)
Pt went to ED yesterday for a blood clot. ED did locate a SUPERFICIAL blood clot in lower leg.  They advised pt to have another scan in a week.  Pt is unsure where he needs to go for the scan.  Do you need to see pt first, and then do referral.? If so when do you want me to work in?  Pt had a blood clot a year ago which turned into pulmonary embolism, so he perfers to get this going

## 2015-01-02 NOTE — Telephone Encounter (Signed)
[name] can you order this and try to get this done on Thursday or Friday. Vas [id number] appears to be the oder. On sheet states LE venous (DVT)

## 2015-01-02 NOTE — Telephone Encounter (Signed)
See below

## 2015-01-03 ENCOUNTER — Telehealth: Payer: Self-pay

## 2015-01-03 ENCOUNTER — Other Ambulatory Visit: Payer: Self-pay | Admitting: Family Medicine

## 2015-01-03 DIAGNOSIS — Z86718 Personal history of other venous thrombosis and embolism: Secondary | ICD-10-CM

## 2015-01-03 NOTE — Telephone Encounter (Signed)
Patient was advised to return in 1 month after his June appointment for bloodwork. When he has a clot is not the right time to check this. Superficial clot is also far different from DVT. I would advise office visit as this is complex and lab work may not be beneficial- would wait until after we have report back on repeat duplex.

## 2015-01-03 NOTE — Telephone Encounter (Signed)
Pt came in stating that he feels the need to have further blood work done to make sure everything is ok with him since continues to have reccurent blood clots. He states you thought it could have been coming from him taking testosterone but that was stopped and now he has another blood clot. He feels as if something else is going on with him so he would like further testing along with extensive lab workup. Please advise.

## 2015-01-04 NOTE — Telephone Encounter (Signed)
Pt returned call and notified of below message.

## 2015-01-04 NOTE — Telephone Encounter (Signed)
Lm on pt vm tcb

## 2015-01-06 ENCOUNTER — Ambulatory Visit (HOSPITAL_COMMUNITY)
Admission: RE | Admit: 2015-01-06 | Discharge: 2015-01-06 | Disposition: A | Discharge status: Home / Self Care | Payer: BLUE CROSS/BLUE SHIELD | Source: Ambulatory Visit | Attending: Cardiovascular Disease | Admitting: Cardiovascular Disease

## 2015-01-06 DIAGNOSIS — Z86718 Personal history of other venous thrombosis and embolism: Secondary | ICD-10-CM | POA: Diagnosis not present

## 2015-01-09 ENCOUNTER — Other Ambulatory Visit: Payer: Self-pay | Admitting: Family Medicine

## 2015-01-09 ENCOUNTER — Telehealth: Payer: Self-pay | Admitting: Family Medicine

## 2015-01-09 DIAGNOSIS — I2699 Other pulmonary embolism without acute cor pulmonale: Secondary | ICD-10-CM

## 2015-01-09 MED ORDER — RIVAROXABAN 20 MG PO TABS: ORAL_TABLET | ORAL | Status: DC

## 2015-01-09 MED ORDER — RIVAROXABAN (XARELTO) VTE STARTER PACK (15 & 20 MG): ORAL_TABLET | ORAL | Status: DC

## 2015-01-09 NOTE — Telephone Encounter (Signed)
mychart message sent

## 2015-01-09 NOTE — Telephone Encounter (Signed)
I spoke with patient. Refer to hematology given recurrence of thromboembolus off xarelto, this time not on testosterone therapy.   While waiting restart xarelto given in greater saphenous vein with potential to communicate to deep system.   Bevelyn Ngo- please remove aspirin from his list and either call or send mychart message for him to stop- I forgot to mention this when we talked by phone

## 2015-01-10 ENCOUNTER — Telehealth: Payer: Self-pay | Admitting: Family Medicine

## 2015-01-10 NOTE — Telephone Encounter (Signed)
Do we have enough samples for 3 weeks of the 15 Keba? If not- would it be cheaper to send in 15 mg #42 BID for 2 weeks instead of starter pack? Please check with pharmacy and send in if would be cheaper

## 2015-01-10 NOTE — Telephone Encounter (Signed)
See below

## 2015-01-10 NOTE — Telephone Encounter (Signed)
Pt advised to start back on Xarelto starter pack. It is 91m and 20 mg dosage. However, pt states it was $500. Pt states he has 3 bottles , about 75 tabs. They are 20 mg.  Pt states he can cut a 20 mg into a 15 mg with no problem. Pt wants to know if OK with you. He does not want to spend $500 when he has this much at home and may be on Xarelto for a month. pls advise.

## 2015-01-11 NOTE — Telephone Encounter (Signed)
Will inform pt that samples are up front for pick up.

## 2015-01-11 NOTE — Telephone Encounter (Signed)
Yes, we do have samples.

## 2015-01-16 ENCOUNTER — Telehealth: Payer: Self-pay | Admitting: Oncology

## 2015-01-16 NOTE — Telephone Encounter (Signed)
Lt mess regarding new pt referral for hem

## 2015-01-24 ENCOUNTER — Telehealth: Payer: Self-pay | Admitting: Oncology

## 2015-01-24 NOTE — Telephone Encounter (Signed)
Contacted referring provider to cancel referral due to no contact with pt.

## 2015-02-06 ENCOUNTER — Ambulatory Visit: Payer: BLUE CROSS/BLUE SHIELD | Admitting: Internal Medicine

## 2015-02-06 ENCOUNTER — Encounter: Payer: Self-pay | Admitting: Family Medicine

## 2015-02-06 ENCOUNTER — Ambulatory Visit (INDEPENDENT_AMBULATORY_CARE_PROVIDER_SITE_OTHER): Payer: BLUE CROSS/BLUE SHIELD | Admitting: Family Medicine

## 2015-02-06 VITALS — BP 110/80 | HR 89 | Temp 97.3°F | Ht 68.0 in | Wt 215.0 lb

## 2015-02-06 DIAGNOSIS — M545 Low back pain: Secondary | ICD-10-CM

## 2015-02-06 MED ORDER — CYCLOBENZAPRINE HCL 10 MG PO TABS: 10.0000 mg | ORAL_TABLET | Freq: Three times a day (TID) | ORAL | Status: DC | PRN

## 2015-02-06 MED ORDER — TRAMADOL HCL 50 MG PO TABS: 50.0000 mg | ORAL_TABLET | Freq: Two times a day (BID) | ORAL | Status: DC | PRN

## 2015-02-06 NOTE — Progress Notes (Signed)
Pre visit review using our clinic review tool, if applicable. No additional management support is needed unless otherwise documented below in the visit note. 

## 2015-02-06 NOTE — Patient Instructions (Signed)
BEFORE YOU LEAVE: -low back exercises -schedule follow up in 1 month  Heat, tylenol and topical sports creams for pain per instructions on packaging.  Can use the muscle relaxer at night   Can use the tramadol if needed for pain not controlled by other measures  Do the exercises 4 days per week

## 2015-02-06 NOTE — Progress Notes (Signed)
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  Past Medical History  Diagnosis Date  . Ulcerative colitis, unspecified   . Depression   . Hypogonadism male     Past Surgical History  Procedure Laterality Date  . Orif mandibular fracture Left 05/09/2012    Procedure: OPEN REDUCTION INTERNAL FIXATION (ORIF) RIGHT ANTERIOR MANDIBULAR FRACTURE; Open Reduction Internal Fixation of Midface fracture; Reduction of septal fracture; arch bars; Repair of lacerations;  Surgeon: Jerrell Belfast, MD;  Location: [place];  Service: ENT;  Laterality: Left;  . Tracheostomy tube placement N/A 05/09/2012    Procedure: TRACHEOSTOMY;  Surgeon: Jerrell Belfast, MD;  Location: [place];  Service: ENT;  Laterality: N/A;  . Mandibular hardware removal Bilateral 07/10/2012    Procedure: BILATERAL MANDIBULAR HARDWARE REMOVAL;  Surgeon: Jerrell Belfast, MD;  Location: [facility] OR;  Service: ENT;  Laterality: Bilateral;    Family History  Problem Relation Age of Onset  . COPD Father   . Lung cancer      Social History   Social History  . Marital Status: Divorced    Spouse Name: N/A  . Number of Children: N/A  . Years of Education: N/A   Social History Main Topics  . Smoking status: Never Smoker   . Smokeless tobacco: Never Used  . Alcohol Use: Yes     Comment: OCCASIONALLY  . Drug Use: No  . Sexual Activity: Yes   Other Topics Concern  . None   Social History Narrative     Current outpatient prescriptions:  .  Rivaroxaban (XARELTO STARTER PACK) 15 & 20 MG TBPK, Take  as directed on package: Start with one 62m tablet by mouth twice a day with food. On Day 22, switch to one 289mtablet once a day., Disp: 51 each, Rfl: 0 .  rivaroxaban (XARELTO) 20 MG TABS tablet, TAKE 1 TABLET BY MOUTH EVERY DAY WITH SUPPER**AFTER YOU FINISH STARTER PACK, Disp: 30 tablet, Rfl: 5 .  sildenafil (VIAGRA) 100 MG tablet, Take 0.5-1 tablets (50-100 mg total) by mouth daily as needed for erectile dysfunction., Disp: 5 tablet, Rfl: 11 .  cyclobenzaprine (FLEXERIL) 10 MG tablet, Take 1 tablet (10 mg total) by mouth 3 (three) times daily as needed for muscle spasms., Disp: 30 tablet, Rfl: 0 .  traMADol (ULTRAM) 50 MG tablet, Take 1 tablet (50 mg total) by mouth every 12 (twelve) hours as needed., Disp: 15 tablet, Rfl: 0  EXAM:  Filed Vitals:   02/06/15 1603  BP: 110/80  Pulse: 89  Temp: 97.3 F (36.3 C)    Body mass index is 32.7 kg/(m^2).  GENERAL: vitals reviewed and listed above, alert, oriented, appears well hydrated and in no acute distress  HEENT: atraumatic, conjunttiva clear, no obvious abnormalities on inspection of external nose and ears  NECK: no obvious masses on inspection  LUNGS: clear to auscultation bilaterally, no wheezes, rales or rhonchi, good air movement  CV: HRRR, no peripheral edema  MS: moves all extremities without noticeable abnormality  Normal Gait Normal inspection of back, no obvious scoliosis or leg  length descrepancy No bony TTP Soft tissue TTP at: bilar lumbar paraspinal muscles -/+ tests: neg trendelenburg,-facet loading, -SLRT, -CLRT, -FABER, -FADIR Normal muscle strength, sensation to light touch and DTRs in LEs bilaterally  PSYCH: pleasant and cooperative, no obvious depression or anxiety  ASSESSMENT AND PLAN:  Discussed the following assessment and plan:  Low back pain without sciatica, unspecified back pain laterality  -we discussed possible serious and likely etiologies, workup and treatment, treatment risks and return  precautions -after this discussion, Imri opted for tx per recs below for likely muscle strain -follow up advised in 4 weeks -of course, we advised [place]  to return or notify a doctor immediately if symptoms worsen or persist or new concerns arise.  -Patient advised to return or notify a doctor immediately if symptoms worsen or persist or new concerns arise.  Patient Instructions  BEFORE YOU LEAVE: -low back exercises -schedule follow up in 1 month  Heat, tylenol and topical sports creams for pain per instructions on packaging.  Can use the muscle relaxer at night   Can use the tramadol if needed for pain not controlled by other measures  Do the exercises 4 days per week       [place] Po R.

## 2015-03-09 ENCOUNTER — Ambulatory Visit (INDEPENDENT_AMBULATORY_CARE_PROVIDER_SITE_OTHER): Payer: BLUE CROSS/BLUE SHIELD | Admitting: Family Medicine

## 2015-03-09 ENCOUNTER — Encounter: Payer: Self-pay | Admitting: Family Medicine

## 2015-03-09 VITALS — BP 150/78 | HR 71 | Temp 98.4°F | Wt 226.0 lb

## 2015-03-09 DIAGNOSIS — M545 Low back pain: Secondary | ICD-10-CM | POA: Diagnosis not present

## 2015-03-09 DIAGNOSIS — I2699 Other pulmonary embolism without acute cor pulmonale: Secondary | ICD-10-CM

## 2015-03-09 NOTE — Progress Notes (Signed)
Garret Reddish, MD  Subjective:  [name] is a 50 y.o. year old very pleasant male patient who presents for/with See problem oriented charting ROS- no chest pain or shortness of breath. No leg swelling. No headache or blurry vision.   Past Medical History-  Patient Active Problem List   Diagnosis Date Noted  . Thyroid nodule 02/01/2014    Priority: High  . Pulmonary embolism ? due to testosterone therapy 01/31/2014    Priority: High  . Ulcerative colitis ([place]) 03/04/1992    Priority: High  . History of DVT (deep vein thrombosis) 02/10/2014    Priority: Medium  . Mandible open fracture ([place]) 05/09/2012    Priority: Low    Class: Acute  . Open fracture facial bones ([place]) 05/09/2012    Priority: Low    Class: Acute  . 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 Hypogonadism in male 01/15/2007    Priority: Low  . DEPRESSION 01/15/2007  . HYPERLIPIDEMIA 01/09/2007  . 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 rivaroxaban (XARELTO) 20 MG TABS tablet TAKE 1 TABLET BY MOUTH EVERY DAY WITH SUPPER**AFTER YOU FINISH STARTER PACK 30 tablet 5   No current facility-administered medications for this visit.    Objective: BP 150/78 mmHg  Pulse 71  Temp(Src) 98.4 F (36.9 C)  Wt 226 lb (102.513 kg) Gen: NAD, resting comfortably CV: RRR no murmurs rubs or gallops Lungs: CTAB no crackles, wheeze, rhonchi Abdomen: soft/nontender/nondistended/normal bowel sounds. No rebound or guarding.  Ext: no edema Skin: warm, dry Neuro: grossly normal, moves all extremities  Assessment/Plan:  Pulmonary embolism ? due to testosterone therapy S: patient with PE thought possibly related to testosteone therapy in 01/2014. On 6 months of xarelto then came off. After coming off xarelto in 01/2015, developed superficial "stable left GSV superficial, occlusive thrombus  from the proximal 1/3rd of the left calf extending to the distal calf." concern was that this could extend into deep system. The plan had been to do hypercoag workup off of xarelto but patient had not returned to complete this before developed new clot.  A/P: I am hesitant to take patient off anticoagulant now without hematology's advice. Also, would appreciate their opinion on workup while still on anticoagulant. We have repeated referral to hematology at this time> patient had issues getting to phone last time but promises to be diligent this occasion.   Low back pain follow up S:LBP since early December. Had thrown back out in the past. 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 R >left pain. Sharp pains. Muscle relaxants had helped in past. He saw Dr. Maudie Mercury and was advisedBack exercises, heat, tylenol, topical sports creams. Muscle relaxant at night. Tramadol for pain. Patient states flexeril did nothing, tramadol helped minimally. He was aware of bleeding risk on nsaid but ibuprofen really helped so he took a few. Symptoms have now resolved after changing to a firmer mattress.  A/P: symptoms have resolved- he is continuing his stretches. We will monitor as has flared in past.   Return precautions advised.   Orders Placed This Encounter  Procedures  . Ambulatory referral to Hematology    Referral Priority:  Routine    Referral Type:  Consultation    Referral Reason:  Specialty Services Required    Requested Specialty:  Oncology    Number of Visits Requested:  1

## 2015-03-09 NOTE — Patient Instructions (Signed)
Glad the back is better  Repeat referral to hematology. We will call you within a week about your referral. Please makes ure to pick up the phone!  If you do not hear within 2 weeks, give [place] a call.

## 2015-03-09 NOTE — Assessment & Plan Note (Signed)
S: patient with PE thought possibly related to testosteone therapy in 01/2014. On 6 months of xarelto then came off. After coming off xarelto in 01/2015, developed superficial "stable left GSV superficial, occlusive thrombus from the proximal 1/3rd of the left calf extending to the distal calf." concern was that this could extend into deep system. The plan had been to do hypercoag workup off of xarelto but patient had not returned to complete this before developed new clot.  A/P: I am hesitant to take patient off anticoagulant now without hematology's advice. Also, would appreciate their opinion on workup while still on anticoagulant. We have repeated referral to hematology at this time> patient had issues getting to phone last time but promises to be diligent this occasion.

## 2015-03-28 ENCOUNTER — Telehealth: Payer: Self-pay | Admitting: Hematology

## 2015-03-28 NOTE — Telephone Encounter (Signed)
Pt aware of np appt on 04/04/15@10 :5 Referring Dr. Yong Channel Dx- acute pulmonary embolism without acute cor pulmonale

## 2015-04-04 ENCOUNTER — Encounter: Payer: Self-pay | Admitting: Hematology

## 2015-04-04 ENCOUNTER — Ambulatory Visit (HOSPITAL_BASED_OUTPATIENT_CLINIC_OR_DEPARTMENT_OTHER): Payer: BLUE CROSS/BLUE SHIELD

## 2015-04-04 ENCOUNTER — Telehealth: Payer: Self-pay | Admitting: Hematology

## 2015-04-04 ENCOUNTER — Ambulatory Visit (HOSPITAL_BASED_OUTPATIENT_CLINIC_OR_DEPARTMENT_OTHER): Payer: BLUE CROSS/BLUE SHIELD | Admitting: Hematology

## 2015-04-04 VITALS — BP 124/83 | HR 80 | Temp 98.1°F | Resp 18 | Ht 68.0 in | Wt 220.2 lb

## 2015-04-04 DIAGNOSIS — I82409 Acute embolism and thrombosis of unspecified deep veins of unspecified lower extremity: Secondary | ICD-10-CM | POA: Diagnosis not present

## 2015-04-04 DIAGNOSIS — D6859 Other primary thrombophilia: Secondary | ICD-10-CM

## 2015-04-04 DIAGNOSIS — I2699 Other pulmonary embolism without acute cor pulmonale: Secondary | ICD-10-CM

## 2015-04-04 LAB — CBC & DIFF AND RETIC
BASO%: 0.4 % (ref 0.0–2.0)
Basophils Absolute: 0 10*3/uL (ref 0.0–0.1)
EOS%: 1.8 % (ref 0.0–7.0)
Eosinophils Absolute: 0.1 10*3/uL (ref 0.0–0.5)
HEMATOCRIT: 45 % (ref 38.4–49.9)
HGB: 15.7 g/dL (ref 13.0–17.1)
Immature Retic Fract: 3.4 % (ref 3.00–10.60)
LYMPH%: 47 % (ref 14.0–49.0)
MCH: 30.4 pg (ref 27.2–33.4)
MCHC: 34.9 g/dL (ref 32.0–36.0)
MCV: 87.2 fL (ref 79.3–98.0)
MONO#: 0.6 10*3/uL (ref 0.1–0.9)
MONO%: 11.8 % (ref 0.0–14.0)
NEUT%: 39 % (ref 39.0–75.0)
NEUTROS ABS: 2.1 10*3/uL (ref 1.5–6.5)
Platelets: 222 10*3/uL (ref 140–400)
RBC: 5.16 10*6/uL (ref 4.20–5.82)
RDW: 13.5 % (ref 11.0–14.6)
Retic %: 1.01 % (ref 0.80–1.80)
Retic Ct Abs: 52.12 10*3/uL (ref 34.80–93.90)
WBC: 5.4 10*3/uL (ref 4.0–10.3)
lymph#: 2.6 10*3/uL (ref 0.9–3.3)

## 2015-04-04 LAB — COMPREHENSIVE METABOLIC PANEL
ALT: 35 U/L (ref 0–55)
AST: 20 U/L (ref 5–34)
Albumin: 4.5 g/dL (ref 3.5–5.0)
Alkaline Phosphatase: 67 U/L (ref 40–150)
Anion Gap: 10 mEq/L (ref 3–11)
BUN: 15.3 mg/dL (ref 7.0–26.0)
CHLORIDE: 104 meq/L (ref 98–109)
CO2: 25 mEq/L (ref 22–29)
Calcium: 9.6 mg/dL (ref 8.4–10.4)
Creatinine: 1 mg/dL (ref 0.7–1.3)
Glucose: 99 mg/dl (ref 70–140)
POTASSIUM: 4.2 meq/L (ref 3.5–5.1)
SODIUM: 139 meq/L (ref 136–145)
Total Bilirubin: 0.68 mg/dL (ref 0.20–1.20)
Total Protein: 8.5 g/dL — ABNORMAL HIGH (ref 6.4–8.3)

## 2015-04-04 NOTE — Progress Notes (Signed)
Marland Kitchen    HEMATOLOGY/ONCOLOGY CONSULTATION NOTE  Date of Service: 04/04/2015  Patient Care Team: Marin Olp, MD as PCP - General (Family Medicine)  CHIEF COMPLAINTS/PURPOSE OF CONSULTATION:  DVT and pulmonary embolism  HISTORY OF PRESENTING ILLNESS:   [name] is a wonderful 50 y.o. male who has been referred to [place] by Dr .Garret Reddish, MD for evaluation and management of h/o DVT/PE.  Patient has a history of ulcerative colitis which he notes he was diagnosed with the 32s while he was in the [place] with the [employer]. He notes that he had pancolitis when he was initially diagnosed and that his symptoms were worst from 1995 thru 2002.Marland Kitchen He has been treated with sulfasalazine and notes that his UC  has been rather stable the last 3 years. He also notes that he was diagnosed with hypogonadism in 2005 and was initially on the topical gels and then switched to injectable testosterone 2008 every month and then was switch to every 3 weekly administration until November 2015 when he presented to the emergency room with symptoms of shortness of breath and dyspnea on exertion for 3 months.   CTA of the chest revealed pulmonary embolism with right ventricular strain.  Echo showed no evidence of artery strain.  Left lower extremity DVT involving the popliteal and posterior tibial veins along with superficial thrombosis of varicosity on the left shin was noted. Some concern that this was related to his testosterone therapy and the fact that he had recently had a prolonged car trip at the time. There was a plan to get a hypercoagulable workup but I do not see this having been done.  Patient has been off testosterone since November 2015.  Patient was on anticoagulation for about 6 months with Xarelto and then was switched to aspirin for DVT prophylaxis.  He presented to the emergency room on 01/01/2015 with pain in his left lower extremity with symptoms of Knots in his left medial lower leg with  no other obvious trigger factors.  No trauma to the area. Patient had ultrasound which was negative for DVT which showed evidence of superficial venous thrombosis in the left greater saphenous vein in the mid calf. He was restarted on Rivaroxaban by his primary care physician and was referred to [place] for further evaluation of a hypercoagulable state.  Patient was not on testosterone during this second episode.  Patient notes no unusual weight loss, fevers or chills, night sweats, other focal symptoms suggesting malignancy. He notes that his ulcerative colitis has been relatively stable and has not been very symptomatic recently.  Currently does not note any back pain, chest pain or shortness of breath. Wonders if he can go back on testosterone therapy and how long he needs to be on anticoagulation.   MEDICAL HISTORY:  Past Medical History  Diagnosis Date  . Ulcerative colitis, unspecified   . Depression   . Hypogonadism male    . Patient Active Problem List   Diagnosis Date Noted  . Hypercoagulable state ([place]) 04/04/2015  . Pulmonary embolism ([place]) 04/04/2015  . DVT (deep venous thrombosis) ([place]) 04/04/2015  . History of DVT (deep vein thrombosis) 02/10/2014  . Thyroid nodule 02/01/2014  . Pulmonary embolism ? due to testosterone therapy 01/31/2014  . Mandible open fracture ([place]) 05/09/2012    Class: Acute  . Open fracture facial bones ([place]) 05/09/2012    Class: Acute  . 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 Hypogonadism in male 01/15/2007  .  DEPRESSION 01/15/2007  . HYPERLIPIDEMIA 01/09/2007  . 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 Ulcerative colitis ([place]) 03/04/1992    SURGICAL HISTORY: Past Surgical History  Procedure Laterality Date  . Orif mandibular fracture Left 05/09/2012    Procedure: OPEN REDUCTION INTERNAL FIXATION (ORIF) RIGHT ANTERIOR MANDIBULAR FRACTURE; Open Reduction Internal Fixation of Midface fracture; Reduction of septal fracture; arch  bars; Repair of lacerations;  Surgeon: Jerrell Belfast, MD;  Location: [place];  Service: ENT;  Laterality: Left;  . Tracheostomy tube placement N/A 05/09/2012    Procedure: TRACHEOSTOMY;  Surgeon: Jerrell Belfast, MD;  Location: [place];  Service: ENT;  Laterality: N/A;  . Mandibular hardware removal Bilateral 07/10/2012    Procedure: BILATERAL MANDIBULAR HARDWARE REMOVAL;  Surgeon: Jerrell Belfast, MD;  Location: [facility];  Service: ENT;  Laterality: Bilateral;    SOCIAL HISTORY: Social History   Social History  . Marital Status: Divorced    Spouse Name: N/A  . Number of Children: N/A  . Years of Education: N/A   Occupational History  . Not on file.   Social History Main Topics  . Smoking status: Never Smoker   . Smokeless tobacco: Never Used  . Alcohol Use: Yes     Comment: OCCASIONALLY  . Drug Use: No  . Sexual Activity: Yes   Other Topics Concern  . Not on file   Social History Narrative    FAMILY HISTORY: Family History  Problem Relation Age of Onset  . COPD Father   . Lung cancer      ALLERGIES:  is allergic to metronidazole.  MEDICATIONS:  Current Outpatient Prescriptions  Medication Sig Dispense Refill  . rivaroxaban (XARELTO) 20 MG TABS tablet TAKE 1 TABLET BY MOUTH EVERY DAY WITH SUPPER**AFTER YOU FINISH STARTER PACK 30 tablet 5   No current facility-administered medications for this visit.    REVIEW OF SYSTEMS:    10 Point review of Systems was done is negative except as noted above.  PHYSICAL EXAMINATION: ECOG PERFORMANCE STATUS: 1 - Symptomatic but completely ambulatory  .There were no vitals filed for this visit. There were no vitals filed for this visit. .There is no weight on file to calculate BMI.  GENERAL:alert, in no acute distress and comfortable SKIN: skin color, texture, turgor are normal, no rashes or significant lesions EYES: normal, conjunctiva are pink and non-injected, sclera clear OROPHARYNX:no exudate, no erythema and lips, buccal  mucosa, and tongue normal  NECK: supple, no JVD, thyroid normal size, non-tender, without nodularity LYMPH:  no palpable lymphadenopathy in the cervical, axillary or inguinal LUNGS: clear to auscultation with normal respiratory effort HEART: regular rate & rhythm,  no murmurs and no lower extremity edema ABDOMEN: abdomen soft, non-tender, normoactive bowel sounds  Musculoskeletal: no cyanosis of digits and no clubbing  PSYCH: alert & oriented x 3 with fluent speech NEURO: no focal motor/sensory deficits  LABORATORY DATA:  I have reviewed the data as listed  . CBC Latest Ref Rng 04/04/2015 02/01/2014 01/31/2014  WBC 4.0 - 10.3 10e3/uL 5.4 6.8 7.2  Hemoglobin 13.0 - 17.1 g/dL 15.7 14.7 16.0  Hematocrit 38.4 - 49.9 % 45.0 43.5 48.3  Platelets 140 - 400 10e3/uL 222 195 226.0   . CBC    Component Value Date/Time   WBC 5.4 04/04/2015 1245   WBC 6.8 02/01/2014 0515   RBC 5.16 04/04/2015 1245   RBC 4.93 02/01/2014 0515   HGB 15.7 04/04/2015 1245   HGB 14.7 02/01/2014 0515   HCT 45.0 04/04/2015 1245  HCT 43.5 02/01/2014 0515   PLT 222 04/04/2015 1245   PLT 195 02/01/2014 0515   MCV 87.2 04/04/2015 1245   MCV 88.2 02/01/2014 0515   MCH 30.4 04/04/2015 1245   MCH 29.8 02/01/2014 0515   MCHC 34.9 04/04/2015 1245   MCHC 33.8 02/01/2014 0515   RDW 13.5 04/04/2015 1245   RDW 13.8 02/01/2014 0515   LYMPHSABS 2.6 04/04/2015 1245   LYMPHSABS 3.2 02/01/2014 0515   MONOABS 0.6 04/04/2015 1245   MONOABS 0.6 02/01/2014 0515   EOSABS 0.1 04/04/2015 1245   EOSABS 0.4 02/01/2014 0515   BASOSABS 0.0 04/04/2015 1245   BASOSABS 0.0 02/01/2014 0515      . CMP Latest Ref Rng 04/04/2015 02/01/2014 01/31/2014  Glucose 70 - 140 mg/dl 99 95 93  BUN 7.0 - 26.0 mg/dL 15._0 Creatinine 0.7 - 1.3 mg/dL 1.0 0.87 1.0  Sodium 136 - 145 mEq/L 139 140 135  Potassium 3.5 - 5.1 mEq/L 4.2 4.5 4.6  Chloride 96 - 112 mEq/L - 102 100  CO2 22 - 29 mEq/L _1 Calcium 8.4 - 10.4 mg/dL 9.6 8.9 9.5    Total Protein 6.4 - 8.3 g/dL 8.5(H) 6.9 7.8  Total Bilirubin 0.20 - 1.20 mg/dL 0.68 0.4 0.6  Alkaline Phos 40 - 150 U/L 67 79 79  AST 5 - 34 U/L _2 ALT 0 - 55 U/L 35 27 34    Component     Latest Ref Rng 04/04/2015  Sodium     136 - 145 mEq/L 139  Potassium     3.5 - 5.1 mEq/L 4.2  Chloride     98 - 109 mEq/L 104  CO2     22 - 29 mEq/L 25  Glucose     70 - 140 mg/dl 99  BUN     7.0 - 26.0 mg/dL 15.3  Creatinine     0.7 - 1.3 mg/dL 1.0  Total Bilirubin     0.20 - 1.20 mg/dL 0.68  Alkaline Phosphatase     40 - 150 U/L 67  AST     5 - 34 U/L 20  ALT     0 - 55 U/L 35  Total Protein     6.4 - 8.3 g/dL 8.5 (H)  Albumin     3.5 - 5.0 g/dL 4.5  Calcium     8.4 - 10.4 mg/dL 9.6  Anion gap     3 - 11 mEq/L 10  EGFR     >90 ml/min/1.73 m2 >90  PTT-LA     0.0 - 40.6 sec 38.8  DRVVT     0.0 - 44.0 sec 48.4 (H)  dRVVT Mix     0.0 - 44.0 sec 41.7  Lupus Anticoag Interp      Comment:  Anticardiolipin Ab,IgG,Qn     0 - 14 GPL U/mL <9  Anticardiolipin Ab,IgM,Qn     0 - 12 MPL U/mL <9  Anticardiolipin Ab,IgA,Qn     0 - 11 APL U/mL <9  Beta-2 Glycoprotein I Ab, IgG     0 - 20 GPI IgG units <9  Beta-2 Glyco 1 IgA     0 - 25 GPI IgA units <9  Beta-2 Glyco 1 IgM     0 - 32 GPI IgM units <9  Factor II, DNA Analysis      Comment  F2 Additional Information:      Comment  FACTOR V LEIDEN  Comment  Comment      Comment  Sed Rate     0 - 15 mm/hr 10  CRP     0.0 - 4.9 mg/L 1.4  D-dimer     0.00 - 0.49 mg/L FEU 0.43  Antithrombin Activity     75 - 135 % 121  Protein C Antigen     60 - 150 % 92  Protein C-Functional     73 - 180 % 120  Protein S-Functional     63 - 140 % 90  Protein S, Total     60 - 150 % 197 (H)   Lupus anticoagulant panel      No reference range information available      Comments:           No lupus anticoagulant was detected.  Comment  Prothrombin gene mutation      No reference range information available       Comments:           NEGATIVE           No mutation identified.            Comment  Factor 5 leiden      No reference range information available      Comments:           Result:  Negative (no mutation found)       .  RADIOGRAPHIC STUDIES:  CTA chest 01/31/2014: IMPRESSION: 1. Positive for acute PE with CT evidence of right heart strain (RV/LV Ratio = 0.92) consistent with at least submassive (intermediate risk) PE. The presence of right heart strain has been associated with an increased risk of morbidity and mortality. Consultation with Pulmonary and Critical Care Medicine is recommended. 2. Indeterminate punctate (approximately 0.8 cm) hypo attenuating nodule within the left lobe of the thyroid. Further evaluation with nonemergent thyroid ultrasound could be performed as clinically indicated. Critical Value/emergent results were called by telephone at the time of interpretation on 01/31/2014 at 9:45 pm to Dr. Veryl Speak, who verbally acknowledged these results.   Electronically Signed  By: Sandi Mariscal M.D.  On: 01/31/2014 21:51  [place] ext 02/01/2014: Summary:  - No evidence of deep vein thrombosis involving the right lower extremity. - Findings consistent with deep vein thrombosis involving the left popliteal vein and left posterial tibial vein. - Superficial thrombosis noted in a varicosity on the left shin.  Other specific details can be found in the table(s) above. Prepared and Electronically Authenticated by  Leia Alf, MD 2015-12-01T13:09:39   ASSESSMENT & PLAN:   50 yo male with  #1 history of left lower extremity DVT and submassive PE in November 2015.  His imaging had shown multiple mixed occlusive and nonocclusive filling defects within the peripheral aspect of the right and left pulmonary arteries with extension to involve nearly all segmental and multiple distal subsegmental pulmonary arteries of all lobes of the bilateral lungs.  Overall  clot burden would be deem to be large.  Patient was on Xarelto for about 6 months and then was transitioned to aspirin.  He had a superficial venous thrombosis in his left lower extremity pain October 2016 with no DVT despite being on aspirin.   He was subsequently placed back on Xarelto and and remains on this currently.  Risk factors. -Initial  Left lower extremity DVT and PE - had a long distance travel and was on testosterone replacement.  Uncertain if the testosterone replacement was the predominant  risk factor since he was on this in some form from 2005.  This could have been an additional risk factor. -Patient has a history of ulcerative colitis which caused pancolitis previously.  He has been clinically having minimal GI symptoms at this time for the last few years but this might still serve as an additional risk factor.  Coagulopathy associated with ulcerative colitis is known to be complex with risk of recurrent DVTs as high as 30% in 5 years. -His hypercoagulable workup done by [place] in clinic this unrevealing for inherited thrombophilia - normal protein C protein S antithrombin III, no factor V Leiden mutation, no prothrombin gene mutation -acquired risk factors - anti-phospholipid antibody panel was negative. -the local risk factor including varicose veins. -The risk of recurrent DVT will be higher given his previous episode irrespective of the cause, male sex, increasing age. Patient notes he is a nonsmoker. -has some obesity.Body mass index is 33.49 kg/(m^2). -no focal symptoms suggesting malignancy -No CBC abnormalities suggesting overt myeloproliferative neoplasm.  Plan -As noted above the patient has a complex mix of risk factors for  Venous thromboembolism. -He remains on therapeutic anticoagulation at this time without any issues with bleeding. -He also remains off testosterone replacement currently. -We discussed general mitigating factors for avoidance of recurrent VTE  including avoiding immobility, working to maintain ideal body weight, avoid dehydration. -we discussed his various risk factors for recurrent thromboembolism.  Though his second event did not involve DVT or PE.  might suggest potentially high risk of recurrent venous thrombosis. We discussed the options and has preference regarding anticoagulation. It certainly would be quite appropriate to consider ongoing anticoagulation. -He questioned me about whether he can be back on testosterone replacement.  We discussed that this could certainly be a risk factor for the current VTE , however it would be a risk versus benefit analysis with his primary care physician/endocrinologist.  If he felt after having this discussion and he certainly wants to be on testosterone understanding the risks I would certainly recommend ongoing anticoagulation and try to minimize testosterone replacement to the minimum necessary to maintain adequate levels. -Along with testosterone replacement - the treating physician wouldn't need to make sure he does not get overtly polycythemic due to replacement and might consider monitoring d-dimer levels for early signs of increasing risk of VTE given his current d-dimer level has normalized at this time. -patient might consider wearing compression socks to reduce the risk of superficial venous thrombosis from varicose veins.  Return to care with Dr. Irene Limbo in 3 months to reevaluate/reconsider that decision for ongoing anticoagulation. Continue followup with primary care physician.  All of the patients questions were answered with apparent satisfaction. The patient knows to call the clinic with any problems, questions or concerns.  I spent 60 minutes counseling the patient face to face. The total time spent in the appointment was 60 minutes and more than 50% was on counseling and direct patient cares.    Sullivan Lone MD [place] AAHIVMS [facility] Hematology/Oncology Physician [facility]  (Office):       [phone] (Work cell):  [phone] (Fax):           [phone]  04/04/2015 10:39 AM

## 2015-04-04 NOTE — Telephone Encounter (Signed)
Talked with and scheduled this patients appointment(s) while patient was here in our office.       [facility].

## 2015-04-05 LAB — SEDIMENTATION RATE: SED RATE: 10 mm/h (ref 0–15)

## 2015-04-05 LAB — D-DIMER, QUANTITATIVE: D-DIMER: 0.43 mg/L FEU (ref 0.00–0.49)

## 2015-04-05 LAB — C-REACTIVE PROTEIN: CRP: 1.4 mg/L (ref 0.0–4.9)

## 2015-04-06 LAB — PROTEIN S ACTIVITY: PROTEIN S ACTIVITY: 90 % (ref 63–140)

## 2015-04-06 LAB — CARDIOLIPIN ANTIBODIES, IGG, IGM, IGA
Anticardiolipin Ab,IgG,Qn: <9 GPL U/mL (ref 0–14)
Anticardiolipin Ab,IgM,Qn: <9 MPL U/mL (ref 0–12)

## 2015-04-06 LAB — PROTEIN C ACTIVITY: Protein C-Functional: 120 % (ref 73–180)

## 2015-04-06 LAB — LUPUS ANTICOAGULANT PANEL
DRVVT MIX: 41.7 s (ref 0.0–44.0)
DRVVT: 48.4 s — AB (ref 0.0–44.0)
PTT-LA: 38.8 s (ref 0.0–40.6)

## 2015-04-06 LAB — BETA-2-GLYCOPROTEIN I ABS, IGG/M/A: Beta-2 Glycoprotein I Ab, IgG: <9 GPI IgG units (ref 0–20)

## 2015-04-06 LAB — PROTEIN C, TOTAL: Protein C Antigen: 92 % (ref 60–150)

## 2015-04-06 LAB — PROTEIN S, TOTAL: Protein S, Total: 197 % — ABNORMAL HIGH (ref 60–150)

## 2015-04-06 LAB — ANTITHROMBIN III: Antithrombin Activity: 121 % (ref 75–135)

## 2015-04-10 LAB — PROTHROMBIN GENE MUTATION

## 2015-04-10 LAB — FACTOR 5 LEIDEN

## 2015-04-27 ENCOUNTER — Telehealth: Payer: Self-pay | Admitting: Family Medicine

## 2015-04-27 NOTE — Telephone Encounter (Signed)
See below

## 2015-04-27 NOTE — Telephone Encounter (Signed)
Pt would like to go back on testosterone. Pt went off over a year ago because of a blood clot.  Pt had lab work w/ Dr Irene Limbo, and he suggested ok to return to that prescrition. (Results in [place]) Pt states he feels like in needs. Pt ok to try testosterone gel as Dr Yong Channel suggested.  [facility] [address] [place]

## 2015-04-27 NOTE — Telephone Encounter (Signed)
Please let patient know I will review chart when completed. I am putting a reminder to check in next Wednesday- Dr. Grier Mitts note is not yet done.

## 2015-04-28 NOTE — Telephone Encounter (Signed)
Lm for pt tcb. 

## 2015-04-28 NOTE — Telephone Encounter (Signed)
Spoke with pt and pt aware of below message.

## 2015-05-05 ENCOUNTER — Telehealth: Payer: Self-pay | Admitting: Family Medicine

## 2015-05-05 NOTE — Telephone Encounter (Signed)
Pt called to follow up on his request for testosterone . Would like a call back concerning this

## 2015-05-05 NOTE — Telephone Encounter (Signed)
Dr. Yong Channel is waiting on Dr. Irene Limbo to close his chart so Dr. Yong Channel will be able to review before restarting testosterone. We will call pt once the chart has been reviewed.

## 2015-05-08 NOTE — Telephone Encounter (Signed)
Dr. Yong Channel is waiting on Dr. Irene Limbo to close his chart so Dr. Yong Channel will be able to review before restarting testosterone. We will call pt once the chart has been reviewed.

## 2015-05-08 NOTE — Telephone Encounter (Signed)
Pt call today to follow up about his request to start testosterone back up

## 2015-05-10 NOTE — Telephone Encounter (Signed)
Pt called and notified that of below.

## 2015-05-15 ENCOUNTER — Other Ambulatory Visit: Payer: Self-pay | Admitting: Family Medicine

## 2015-05-15 DIAGNOSIS — Z79890 Hormone replacement therapy (postmenopausal): Secondary | ICD-10-CM

## 2015-05-15 DIAGNOSIS — Z5181 Encounter for therapeutic drug level monitoring: Secondary | ICD-10-CM

## 2015-06-19 ENCOUNTER — Encounter: Payer: Self-pay | Admitting: Endocrinology

## 2015-06-19 ENCOUNTER — Ambulatory Visit (INDEPENDENT_AMBULATORY_CARE_PROVIDER_SITE_OTHER): Payer: BLUE CROSS/BLUE SHIELD | Admitting: Endocrinology

## 2015-06-19 VITALS — BP 124/82 | HR 72 | Temp 98.5°F | Resp 14 | Ht 68.0 in | Wt 224.0 lb

## 2015-06-19 DIAGNOSIS — E291 Testicular hypofunction: Secondary | ICD-10-CM | POA: Diagnosis not present

## 2015-06-19 DIAGNOSIS — E669 Obesity, unspecified: Secondary | ICD-10-CM | POA: Diagnosis not present

## 2015-06-19 DIAGNOSIS — R5383 Other fatigue: Secondary | ICD-10-CM

## 2015-06-19 DIAGNOSIS — E785 Hyperlipidemia, unspecified: Secondary | ICD-10-CM

## 2015-06-19 NOTE — Progress Notes (Signed)
Patient ID: [name], male   DOB: 10/15/1965, 50 y.o.   MRN: [id number]          Chief complaint: Tiredness  Referring physician: Yong Channel  Reason for consultation: Evaluation and management of low testosterone  History of Present Illness  Hypogonadismwas diagnosed probably about 10 years ago but details are not available  At the time of diagnosis he was having complaints of moodiness, decreased libido, tiredness and low motivation. He was told to have a low testosterone and initially started on AndroGel He did not feel any better with this and he thinks his levels of testosterone were not improved  Subsequently was put on testosterone injections, 200 mg every 3 weeks with improvement in his symptoms and he had less moodiness, fatigue and increased libido although not completely back to normal In 2011 he was apparently taken off the injections for some time and his testosterone level was slightly below normal and he was started back on injections.  Apparently when he had his pulmonary embolism in 11/15 he was told to stop his testosterone injections At that time his hemoglobin level was 16, previously had been about 14  He has had for the last few months complaints offatigue, decreased motivation, moodiness, decreased libido.  Symptoms are more in the last 4 months especially fatigue He also thinks he has gained weight progressively over the last several months  There is no history of the following: Hot flushes, sweats, breast enlargement, long term anabolic steroid use, history of testicular injury mumps in childhood. No history of osteopenia or low impact fracture  Previous weight history: Range 198-226  Wt Readings from Last 3 Encounters:  06/19/15 224 lb (101.606 kg)  04/04/15 220 lb 3.2 oz (99.882 kg)  03/09/15 226 lb (102.513 kg)    Prior lab results showtestosterone levels of:  Lab Results  Component Value Date   TESTOSTERONE 633 05/10/2013   TESTOSTERONE  589.06 05/24/2011   TESTOSTERONE 622.61 08/01/2010   TESTOSTERONE 274.04* 10/13/2009    Prolactin level:Not done  No results found for: [facility]        Medication List       This list is accurate as of: 06/19/15  2:56 PM.  Always use your most recent med list.               rivaroxaban 20 MG Tabs tablet  Commonly known as:  XARELTO  TAKE 1 TABLET BY MOUTH EVERY DAY WITH SUPPER**AFTER YOU FINISH STARTER PACK        Allergies:  Allergies  Allergen Reactions  . Metronidazole Other (See Comments)    REACTION: Fatigue    Past Medical History  Diagnosis Date  . Ulcerative colitis, unspecified   . Depression   . Hypogonadism male     Past Surgical History  Procedure Laterality Date  . Orif mandibular fracture Left 05/09/2012    Procedure: OPEN REDUCTION INTERNAL FIXATION (ORIF) RIGHT ANTERIOR MANDIBULAR FRACTURE; Open Reduction Internal Fixation of Midface fracture; Reduction of septal fracture; arch bars; Repair of lacerations;  Surgeon: Jerrell Belfast, MD;  Location: [place];  Service: ENT;  Laterality: Left;  . Tracheostomy tube placement N/A 05/09/2012    Procedure: TRACHEOSTOMY;  Surgeon: Jerrell Belfast, MD;  Location: [place];  Service: ENT;  Laterality: N/A;  . Mandibular hardware removal Bilateral 07/10/2012    Procedure: BILATERAL MANDIBULAR HARDWARE REMOVAL;  Surgeon: Jerrell Belfast, MD;  Location: [facility];  Service: ENT;  Laterality: Bilateral;    Family History  Problem Relation  Age of Onset  . COPD Father   . Lung cancer      Social History:  reports that he has never smoked. He has never used smokeless tobacco. He reports that he drinks alcohol. He reports that he does not use illicit drugs.  Review of Systems  Constitutional: Positive for weight gain and malaise. Negative for reduced appetite and diaphoresis.  HENT: Negative for headaches.   Eyes: Negative for blurred vision.  Respiratory: Negative for daytime sleepiness.   Cardiovascular: Negative for  palpitations and leg swelling.  Gastrointestinal: Negative for nausea.  Endocrine: Negative for cold intolerance and heat intolerance.  Genitourinary: Negative for slow stream.  Musculoskeletal: Negative for joint pain.       Feet hurt at times  Neurological: Negative for numbness and tingling.  Psychiatric/Behavioral: Negative for depressed mood and insomnia.      General Examination:   BP 124/82 mmHg  Pulse 72  Temp(Src) 98.5 F (36.9 C)  Resp 14  Ht 5' 8"  (1.727 m)  Wt 224 lb (101.606 kg)  BMI 34.07 kg/m2  SpO2 95%  GENERAL APPEARANCE Has mild generalized obesity.  SKIN:normal, no rash or pigmentation.  HEENT:Oral mucosa normal.  EYES:normal external appearance of eyes, Fundii benign.   NECK:no lymphadenopathy, no thyromegaly.  CHEST: Gynecomastia present bilaterally, mild, has firm breast tissue measuring about 3 cm LUNGS:clear to auscultation bilaterally, no wheezes, rhonchi, rales.  HEART:normal S1 And S2, no S3, S4, murmur or click.  ABDOMEN:no hepatosplenomegaly, no masses palpated, soft and not tender.   MALE GENITOURINARY:left testicle 2.5-3 cm and right testicle about 3 cm, slightly softer on the left.   MUSCULOSKELETALNo enlargement or deformity of joints.  EXTREMITIES:no clubbing, no edema.  NEUROLOGIC EXAM: Biceps reflexes normal (2+) bilaterally.   Assessment/ Plan:  ? Hypogonadotropic hypogonadism  His records do not include any prior workup of hypogonadism before starting on testosterone supplementation He appears to be clinically symptomatic from his hypogonadism Currently does not have any other features of metabolic syndrome although has history of hyperlipidemia in the past  Discussed with the patient that we will need to reevaluate his free testosterone level and also to basic pituitary gland evaluation with LH, prolactin and free T4 levels. Explained to him that most likely  testosterone injections will possibly cause the pulmonary embolism usually because of tendency to increase the hemoglobin  Discussed various options for testosterone supplementation including transdermal gel or liquid preparations, Androderm patch, testosterone injections and clomiphene.  Discussed pros and cons of various treatments  We will evaluate his labs and discuss the treatment plans when these are reviewed  HYPERLIPIDEMIA: Needs follow-up Also would like to rule out impaired fasting glucose   Jathniel Smeltzer 06/19/2015, 2:56 PM

## 2015-06-20 ENCOUNTER — Other Ambulatory Visit (INDEPENDENT_AMBULATORY_CARE_PROVIDER_SITE_OTHER): Payer: BLUE CROSS/BLUE SHIELD

## 2015-06-20 DIAGNOSIS — E669 Obesity, unspecified: Secondary | ICD-10-CM

## 2015-06-20 DIAGNOSIS — Z86718 Personal history of other venous thrombosis and embolism: Secondary | ICD-10-CM

## 2015-06-20 DIAGNOSIS — I2699 Other pulmonary embolism without acute cor pulmonale: Secondary | ICD-10-CM | POA: Diagnosis not present

## 2015-06-20 DIAGNOSIS — E785 Hyperlipidemia, unspecified: Secondary | ICD-10-CM

## 2015-06-20 DIAGNOSIS — R5383 Other fatigue: Secondary | ICD-10-CM | POA: Diagnosis not present

## 2015-06-20 DIAGNOSIS — E291 Testicular hypofunction: Secondary | ICD-10-CM

## 2015-06-20 LAB — LIPID PANEL
CHOLESTEROL: 209 mg/dL — AB (ref 0–200)
HDL: 32.9 mg/dL — AB (ref >39.00)
NONHDL: 176.03
TRIGLYCERIDES: 255 mg/dL — AB (ref 0.0–149.0)
Total CHOL/HDL Ratio: 6
VLDL: 51 mg/dL — ABNORMAL HIGH (ref 0.0–40.0)

## 2015-06-20 LAB — T4, FREE: FREE T4: 0.93 ng/dL (ref 0.60–1.60)

## 2015-06-20 LAB — TSH: TSH: 1.4 u[IU]/mL (ref 0.35–4.50)

## 2015-06-20 LAB — GLUCOSE, RANDOM: GLUCOSE: 95 mg/dL (ref 70–99)

## 2015-06-20 LAB — LDL CHOLESTEROL, DIRECT: LDL DIRECT: 120 mg/dL

## 2015-06-20 LAB — LUTEINIZING HORMONE: LH: 6.33 m[IU]/mL (ref 1.50–9.30)

## 2015-06-21 ENCOUNTER — Telehealth: Payer: Self-pay | Admitting: Family Medicine

## 2015-06-21 NOTE — Telephone Encounter (Signed)
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

## 2015-06-22 ENCOUNTER — Other Ambulatory Visit: Payer: BLUE CROSS/BLUE SHIELD

## 2015-06-22 ENCOUNTER — Other Ambulatory Visit: Payer: BLUE CROSS/BLUE SHIELD | Admitting: Family Medicine

## 2015-06-22 ENCOUNTER — Other Ambulatory Visit: Payer: Self-pay | Admitting: Family Medicine

## 2015-06-22 DIAGNOSIS — D6859 Other primary thrombophilia: Secondary | ICD-10-CM

## 2015-06-22 NOTE — Telephone Encounter (Signed)
Pt will come in this afternoon for that lab. Thanks!

## 2015-06-22 NOTE — Telephone Encounter (Signed)
Lab has been ordered, schedule pt.

## 2015-06-22 NOTE — Telephone Encounter (Signed)
Is this ok to order?

## 2015-06-22 NOTE — Telephone Encounter (Signed)
That's fine

## 2015-06-23 ENCOUNTER — Encounter: Payer: Self-pay | Admitting: Family Medicine

## 2015-06-23 LAB — RFX PTT-LA W/RFX TO HEX PHASE CONF: PTT-LA SCREEN: 66 s — AB (ref <=40)

## 2015-06-23 LAB — TESTOSTERONE: TESTOSTERONE, FREE: 296

## 2015-06-23 LAB — PHOSPHATIDYLSERINE IGG, IGA, IGM

## 2015-06-23 LAB — RFLX HEXAGONAL PHASE CONFIRM

## 2015-06-23 LAB — RFX DRVVT SCR W/RFLX CONF 1:1 MIX: DRVVT SCREEN: 87 s — AB (ref <=45)

## 2015-06-23 LAB — RFLX DRVVT CONFRIM: Drvvt confirmation: NEGATIVE

## 2015-06-25 LAB — HYPERCOAGULABLE PANEL, COMPREHENSIVE
ANTITHROMB III FUNC: 112 %{activity} (ref 80–120)
Anticardiolipin IgA: <11 [APL'U]
Anticardiolipin IgG: <14 [GPL'U]
Beta-2 Glyco I IgG: <9 SGU (ref <=20)
Beta-2-Glycoprotein I IgA: <9 SAU (ref <=20)
Beta-2-Glycoprotein I IgM: <9 SMU (ref <=20)
PROTEIN C ANTIGEN: 105 % (ref 70–140)
PROTEIN S ANTIGEN, TOTAL: 153 % — AB (ref 70–140)
Protein C Activity: 162 % (ref 70–180)
Protein S Activity: 168 % — ABNORMAL HIGH (ref 70–150)

## 2015-06-26 ENCOUNTER — Other Ambulatory Visit: Payer: Self-pay

## 2015-06-27 ENCOUNTER — Other Ambulatory Visit: Payer: BLUE CROSS/BLUE SHIELD | Admitting: Family Medicine

## 2015-06-27 LAB — HYPERCOAGULABLE PANEL, COMPREHENSIVE
Anticardiolipin IgA: <11 [APL'U]
Anticardiolipin IgM: <12 [MPL'U]
Beta-2 Glyco I IgG: <9 SGU (ref <=20)
Beta-2-Glycoprotein I IgA: <9 SAU (ref <=20)
Beta-2-Glycoprotein I IgM: <9 SMU (ref <=20)

## 2015-06-27 LAB — RFX PTT-LA W/RFX TO HEX PHASE CONF

## 2015-06-27 LAB — RFX DRVVT SCR W/RFLX CONF 1:1 MIX

## 2015-06-29 ENCOUNTER — Other Ambulatory Visit: Payer: Self-pay | Admitting: *Deleted

## 2015-06-29 MED ORDER — CLOMIPHENE CITRATE 50 MG PO TABS: ORAL_TABLET | ORAL | Status: DC

## 2015-06-30 ENCOUNTER — Other Ambulatory Visit: Payer: Self-pay | Admitting: *Deleted

## 2015-06-30 ENCOUNTER — Other Ambulatory Visit: Payer: Self-pay | Admitting: Endocrinology

## 2015-06-30 DIAGNOSIS — E291 Testicular hypofunction: Secondary | ICD-10-CM

## 2015-07-03 ENCOUNTER — Telehealth: Payer: Self-pay | Admitting: Hematology

## 2015-07-03 ENCOUNTER — Ambulatory Visit: Payer: BLUE CROSS/BLUE SHIELD | Admitting: Hematology

## 2015-07-03 ENCOUNTER — Other Ambulatory Visit: Payer: BLUE CROSS/BLUE SHIELD

## 2015-07-03 NOTE — Telephone Encounter (Signed)
Pt came in to cx appt...did not want to r/s

## 2015-08-23 ENCOUNTER — Encounter: Payer: Self-pay | Admitting: Endocrinology

## 2015-08-23 ENCOUNTER — Telehealth: Payer: Self-pay | Admitting: Endocrinology

## 2015-08-23 ENCOUNTER — Ambulatory Visit (INDEPENDENT_AMBULATORY_CARE_PROVIDER_SITE_OTHER): Payer: BLUE CROSS/BLUE SHIELD | Admitting: Endocrinology

## 2015-08-23 VITALS — BP 128/78 | HR 76 | Wt 225.4 lb

## 2015-08-23 DIAGNOSIS — E291 Testicular hypofunction: Secondary | ICD-10-CM | POA: Diagnosis not present

## 2015-08-23 LAB — CBC
HEMATOCRIT: 42.6 % (ref 39.0–52.0)
HEMOGLOBIN: 14.6 g/dL (ref 13.0–17.0)
MCHC: 34.3 g/dL (ref 30.0–36.0)
MCV: 87.2 fl (ref 78.0–100.0)
Platelets: 223 10*3/uL (ref 150.0–400.0)
RBC: 4.89 Mil/uL (ref 4.22–5.81)
RDW: 14.3 % (ref 11.5–15.5)
WBC: 7 10*3/uL (ref 4.0–10.5)

## 2015-08-23 MED ORDER — TESTOSTERONE 50 MG/5GM (1%) TD GEL: 5.0000 g | Freq: Every day | TRANSDERMAL | Status: DC

## 2015-08-23 NOTE — Telephone Encounter (Signed)
Unable to reach him today He needs to go to his PCP office fasting any morning to do the free testosterone test as the previous test done in April was normal We need to have him hold off on starting the testosterone for now, can talk to him if needed

## 2015-08-23 NOTE — Progress Notes (Signed)
Patient ID: CHANDRA FEGER, male   DOB: November 29, 1965, 50 y.o.   MRN: [id number]           Referring physician: Yong Channel  Reason for visit: Follow-up of low testosterone  History of Present Illness  Hypogonadismwas diagnosed probably about 10 years ago but details are not available At the time of diagnosis he was having complaints of moodiness, decreased libido, tiredness and low motivation. He was told to have a low testosterone and initially started on AndroGel He did not feel any better with this and he thinks his levels of testosterone were not improved Subsequently was put on testosterone injections, 200 mg every 3 weeks with improvement in his symptoms and he had less moodiness, fatigue and increased libido although not completely back to normal In 2011 he was apparently taken off the injections for some time and his testosterone level was slightly below normal and he was started back on injections.  Apparently when he had his pulmonary embolism in 11/15 he was told to stop his testosterone injections At that time his hemoglobin level was 16, previously had been about 14  He has had for the last few months complaints ofprogressive fatigue, decreased motivation, moodiness, decreased libido.   He also thinks he has gained weight progressively over the last several months  Previous weight history: Range 198-226   Wt Readings from Last 3 Encounters:  08/23/15 225 lb 6.4 oz (102.241 kg)  06/19/15 224 lb (101.606 kg)  04/04/15 220 lb 3.2 oz (99.882 kg)    Prior lab results showtestosterone levels:  Lab Results  Component Value Date   TESTOSTERONE 633 05/10/2013   TESTOSTERONE 589.06 05/24/2011   TESTOSTERONE 622.61 08/01/2010   TESTOSTERONE 274.04* 10/13/2009    Prolactin level: Normal   Lab Results  Component Value Date   LH 6.33 06/20/2015    Because of his symptoms and low normal total testosterone he was started on clomiphene empirically, 25 mg 3 times a week in  late April 2017 He however says he does not feel any better with his energy level or libido Has not had any follow-up labs He has not taken his clomiphene for at least a week as he ran out       Medication List       This list is accurate as of: 08/23/15  5:05 PM.  Always use your most recent med list.               clomiPHENE 50 MG tablet  Commonly known as:  CLOMID  Take 1/2 tablet three times a week     rivaroxaban 20 MG Tabs tablet  Commonly known as:  XARELTO  TAKE 1 TABLET BY MOUTH EVERY DAY WITH SUPPER**AFTER YOU FINISH STARTER PACK     testosterone 50 MG/5GM (1%) Gel  Commonly known as:  ANDROGEL  Place 5 g onto the skin daily.        Allergies:  Allergies  Allergen Reactions  . Metronidazole Other (See Comments)    REACTION: Fatigue    Past Medical History  Diagnosis Date  . Ulcerative colitis, unspecified   . Depression   . Hypogonadism male     Past Surgical History  Procedure Laterality Date  . Orif mandibular fracture Left 05/09/2012    Procedure: OPEN REDUCTION INTERNAL FIXATION (ORIF) RIGHT ANTERIOR MANDIBULAR FRACTURE; Open Reduction Internal Fixation of Midface fracture; Reduction of septal fracture; arch bars; Repair of lacerations;  Surgeon: Jerrell Belfast, MD;  Location: [place];  Service:  ENT;  Laterality: Left;  . Tracheostomy tube placement N/A 05/09/2012    Procedure: TRACHEOSTOMY;  Surgeon: Jerrell Belfast, MD;  Location: [place];  Service: ENT;  Laterality: N/A;  . Mandibular hardware removal Bilateral 07/10/2012    Procedure: BILATERAL MANDIBULAR HARDWARE REMOVAL;  Surgeon: Jerrell Belfast, MD;  Location: [facility] OR;  Service: ENT;  Laterality: Bilateral;    Family History  Problem Relation Age of Onset  . COPD Father   . Lung cancer      Social History:  reports that he has never smoked. He has never used smokeless tobacco. He reports that he drinks alcohol. He reports that he does not use illicit drugs.  Review of Systems     General Examination:   BP 128/78 mmHg  Pulse 76  Wt 225 lb 6.4 oz (102.241 kg)  SpO2 96%     Assessment/ Plan:  ? Hypogonadotropic hypogonadism  Although he has had symptoms suggestive of hypogonadism his free testosterone recently was normal along with a total testosterone 296 Not clear if he is truly hypogonadism His symptoms did not improve with using clomiphene for the last 2 months but follow-up labs are not available  Will recheck his free testosterone and decide on treatment Most likely if he is still symptomatic and free testosterone is still low-normal will have him try Testim 5 g daily   Charnette Younkin 08/23/2015, 5:05 PM

## 2015-08-24 ENCOUNTER — Other Ambulatory Visit (INDEPENDENT_AMBULATORY_CARE_PROVIDER_SITE_OTHER): Payer: BLUE CROSS/BLUE SHIELD

## 2015-08-24 DIAGNOSIS — E291 Testicular hypofunction: Secondary | ICD-10-CM

## 2015-08-24 NOTE — Telephone Encounter (Signed)
I contacted the pt and advised of Md's note below via voicemail. Requested a call back if the pt would like to discuss.

## 2015-08-24 NOTE — Telephone Encounter (Signed)
This report is the same as the one I was able to see yesterday that was done in April He will have labs done at the primary care office for the free testosterone which was previously normal and needs to do this before starting the Testim

## 2015-08-24 NOTE — Telephone Encounter (Signed)
Received a testosterone result from [facility]. Result placed on your desk should he still f/u with PCP?

## 2015-08-25 ENCOUNTER — Other Ambulatory Visit: Payer: Self-pay

## 2015-08-25 ENCOUNTER — Other Ambulatory Visit: Payer: Self-pay | Admitting: Endocrinology

## 2015-08-25 DIAGNOSIS — I2699 Other pulmonary embolism without acute cor pulmonale: Secondary | ICD-10-CM

## 2015-08-25 NOTE — Telephone Encounter (Signed)
Kieth Brightly Tax adviser) will call [facility] and verify if the lab result is ready for release.

## 2015-08-25 NOTE — Telephone Encounter (Signed)
Patient calling to see if labs results are ready, and if Dr Dwyane Dee going to change his medication, please advise

## 2015-08-28 ENCOUNTER — Encounter: Payer: Self-pay | Admitting: Family Medicine

## 2015-08-28 LAB — TESTOSTERONE, FREE, TOTAL, SHBG
SEX HORMONE BINDING: 17.6
TESTOSTERONE FREE: 6
TESTOSTERONE: 205

## 2015-08-28 NOTE — Telephone Encounter (Signed)
See below. [facility] stated they did not receive the original fax for the testosterone. Original rx has been placed in the shred bin. Ok to print again?

## 2015-08-28 NOTE — Telephone Encounter (Signed)
Patient need a refill of Androgel and Clomid send to  [id number] [phone](Phone) [phone](Fax)

## 2015-08-29 ENCOUNTER — Other Ambulatory Visit: Payer: Self-pay

## 2015-08-29 MED ORDER — CLOMIPHENE CITRATE 50 MG PO TABS: ORAL_TABLET | ORAL | Status: DC

## 2015-08-29 NOTE — Telephone Encounter (Signed)
Will need to resend his testosterone prescription

## 2015-08-30 NOTE — Telephone Encounter (Signed)
PT calling back to ask about his Testim being sent to the pharmacy.

## 2015-08-31 ENCOUNTER — Telehealth: Payer: Self-pay | Admitting: Endocrinology

## 2015-08-31 NOTE — Telephone Encounter (Signed)
testim needs to be called please advise on the results pt been waiting since 6/21 thank you

## 2015-09-01 MED ORDER — TESTOSTERONE 50 MG/5GM (1%) TD GEL: 5.0000 g | Freq: Every day | TRANSDERMAL | Status: DC

## 2015-09-01 NOTE — Telephone Encounter (Signed)
I contacted the pt and advised we have resubmitted the prescription and advised of blood tests. Pt advised the call back if he has any issues picking the medication up.

## 2015-09-12 NOTE — Telephone Encounter (Signed)
error 

## 2015-09-14 ENCOUNTER — Other Ambulatory Visit: Payer: Self-pay | Admitting: Family Medicine

## 2015-09-25 ENCOUNTER — Telehealth: Payer: Self-pay

## 2015-09-25 ENCOUNTER — Telehealth: Payer: Self-pay | Admitting: Family Medicine

## 2015-09-25 NOTE — Telephone Encounter (Signed)
Bara w/[employer] need the patient's testosterone level with the range. Please call back.

## 2015-09-25 NOTE — Telephone Encounter (Signed)
[employer] rep

## 2015-09-25 NOTE — Telephone Encounter (Signed)
[employer] rep called back to get testosterone levels, can leave message if she does not answer, [phone]

## 2015-10-04 ENCOUNTER — Ambulatory Visit: Payer: BLUE CROSS/BLUE SHIELD | Admitting: Endocrinology

## 2015-11-22 ENCOUNTER — Other Ambulatory Visit: Payer: Self-pay | Admitting: *Deleted

## 2015-11-22 DIAGNOSIS — E785 Hyperlipidemia, unspecified: Secondary | ICD-10-CM

## 2015-11-22 DIAGNOSIS — E291 Testicular hypofunction: Secondary | ICD-10-CM

## 2015-11-24 ENCOUNTER — Other Ambulatory Visit (INDEPENDENT_AMBULATORY_CARE_PROVIDER_SITE_OTHER): Payer: BLUE CROSS/BLUE SHIELD

## 2015-11-24 DIAGNOSIS — E291 Testicular hypofunction: Secondary | ICD-10-CM

## 2015-11-24 DIAGNOSIS — E785 Hyperlipidemia, unspecified: Secondary | ICD-10-CM

## 2015-11-24 LAB — LIPID PANEL
CHOLESTEROL: 178 mg/dL (ref 0–200)
HDL: 28.7 mg/dL — ABNORMAL LOW (ref >39.00)
NonHDL: 148.8
Total CHOL/HDL Ratio: 6
Triglycerides: 258 mg/dL — ABNORMAL HIGH (ref 0.0–149.0)
VLDL: 51.6 mg/dL — AB (ref 0.0–40.0)

## 2015-11-24 LAB — TESTOSTERONE: TESTOSTERONE: 189.01 ng/dL — AB (ref 300.00–890.00)

## 2015-11-24 LAB — LDL CHOLESTEROL, DIRECT: LDL DIRECT: 121 mg/dL

## 2015-11-28 ENCOUNTER — Other Ambulatory Visit: Payer: Self-pay | Admitting: Endocrinology

## 2015-11-28 ENCOUNTER — Ambulatory Visit (INDEPENDENT_AMBULATORY_CARE_PROVIDER_SITE_OTHER): Payer: BLUE CROSS/BLUE SHIELD | Admitting: Endocrinology

## 2015-11-28 ENCOUNTER — Encounter: Payer: Self-pay | Admitting: Endocrinology

## 2015-11-28 VITALS — BP 142/94 | HR 79 | Temp 97.7°F | Resp 16 | Ht 68.0 in | Wt 222.0 lb

## 2015-11-28 DIAGNOSIS — E291 Testicular hypofunction: Secondary | ICD-10-CM

## 2015-11-28 MED ORDER — TESTOSTERONE 50 MG/5GM (1%) TD GEL: 10.0000 g | Freq: Every day | TRANSDERMAL | 1 refills | Status: DC

## 2015-11-28 NOTE — Patient Instructions (Signed)
Apply 1 packet on each shoulder

## 2015-11-28 NOTE — Progress Notes (Signed)
Patient ID: [name], male   DOB: 1965-04-12, 50 y.o.   MRN: [id number]           Referring physician: Yong Channel  Reason for visit: Follow-up of low testosterone  History of Present Illness  Hypogonadismwas diagnosed probably about 10 years ago but details are not available At the time of diagnosis he was having complaints of moodiness, decreased libido, tiredness and low motivation. He was told to have a low testosterone and initially started on AndroGel He did not feel any better with this and he thinks his levels of testosterone were not improved  Subsequently was put on testosterone injections, 200 mg every 3 weeks with improvement in his symptoms and he had less moodiness, fatigue and increased libido although not completely back to normal In 2011 he was apparently taken off the injections for some time and his testosterone level was slightly below normal and he was started back on injections.  Apparently when he had his pulmonary embolism in 11/15 he was told to stop his testosterone injections At that time his hemoglobin level was 16, previously had been about 14  He has had for the last few months complaints ofprogressive fatigue, decreased motivation, moodiness, decreased libido.   He also thinks he has gained weight progressively over the last several months  Evaluation showed is free testosterone level to be low at 6.0 LH was normal at 6.3  He was recommended to trial of clomiphene because of his history of increased hematocrit with testosterone but he subjectively did not feel better with this and he stopped his a week prior to his last visit, testosterone was 205 at that time  He was then switched to AndroGel 1% each appears to be the only drug covered by his insurance. Several attempts were made to get this prior authorized through his insurance and the patient appears somewhat frustrated with such delays  Patient thinks that he is feeling worse with his mood and  libido, may have a slight improvement in energy level.  Also complains of irritability Testosterone level is still low, now below 200 on his dose of 5 g AndroGel which is applying daily regularly  Previous weight history: Range 198-226   Wt Readings from Last 3 Encounters:  11/28/15 222 lb (100.7 kg)  08/23/15 225 lb 6.4 oz (102.2 kg)  06/19/15 224 lb (101.6 kg)    Prior lab results showtestosterone levels:  Lab Results  Component Value Date   TESTOSTERONE 189.01 (L) 11/24/2015   TESTOSTERONE 205 08/24/2015   TESTOSTERONE 633 05/10/2013   TESTOSTERONE 589.06 05/24/2011    Prolactin level: Normal   Lab Results  Component Value Date   LH 6.33 06/20/2015           Medication List       Accurate as of 11/28/15  3:49 PM. Always use your most recent med list.          rivaroxaban 20 MG Tabs tablet Commonly known as:  XARELTO Take 1 tablet (20 mg total) by mouth daily with supper.   testosterone 50 MG/5GM (1%) Gel Commonly known as:  ANDROGEL Place 5 g onto the skin daily.       Allergies:  Allergies  Allergen Reactions  . Metronidazole Other (See Comments)    REACTION: Fatigue    Past Medical History:  Diagnosis Date  . Depression   . Hypogonadism male   . Ulcerative colitis, unspecified     Past Surgical History:  Procedure Laterality Date  .  MANDIBULAR HARDWARE REMOVAL Bilateral 07/10/2012   Procedure: BILATERAL MANDIBULAR HARDWARE REMOVAL;  Surgeon: Jerrell Belfast, MD;  Location: [place];  Service: ENT;  Laterality: Bilateral;  . ORIF MANDIBULAR FRACTURE Left 05/09/2012   Procedure: OPEN REDUCTION INTERNAL FIXATION (ORIF) RIGHT ANTERIOR MANDIBULAR FRACTURE; Open Reduction Internal Fixation of Midface fracture; Reduction of septal fracture; arch bars; Repair of lacerations;  Surgeon: Jerrell Belfast, MD;  Location: [place];  Service: ENT;  Laterality: Left;  . TRACHEOSTOMY TUBE PLACEMENT N/A 05/09/2012   Procedure: TRACHEOSTOMY;  Surgeon: Jerrell Belfast, MD;  Location[facility] OR;  Service: ENT;  Laterality: N/A;    Family History  Problem Relation Age of Onset  . COPD Father   . Lung cancer      Social History:  reports that he has never smoked. He has never used smokeless tobacco. He reports that he drinks alcohol. He reports that he does not use drugs.  Review of Systems   He has a previous history of anxiety, not on treatment for any anxiety or depression currently Has not seen PCP recently  He is still on Xarelto and he is apparently wanting to continue this indefinitely for prophylaxis  General Examination:   BP (!) 142/94   Pulse 79   Temp 97.7 F (36.5 C)   Resp 16   Ht 5' 8"  (1.727 m)   Wt 222 lb (100.7 kg)   SpO2 97%   BMI 33.75 kg/m      Assessment/ Plan:   Hypogonadotropic hypogonadism  Although he has had symptoms suggestive of hypogonadism these are out of proportion to his testosterone levels especially with free testosterone level being only slightly low  He did not feel better with starting clomiphene and now with AndroGel 1%  However his total testosterone is recently below 200 and he thinks he is compliant with his treatment with 1 packet daily for about 6 weeks  He symptoms however are more related to depressed mood rather than hypogonadism  As a trial will increase his testosterone to 10 g daily Recommended that he follow-up with his PCP for treatment of depression also  Also discussed options for treatment and that testosterone injections will give variable levels and may not be as safe long-term   [name] 11/28/2015, 3:49 PM

## 2015-11-29 NOTE — Progress Notes (Signed)
Scheduled Friday, December 01, 2015 @ 11:30am

## 2015-12-01 ENCOUNTER — Ambulatory Visit (INDEPENDENT_AMBULATORY_CARE_PROVIDER_SITE_OTHER): Payer: BLUE CROSS/BLUE SHIELD | Admitting: Family Medicine

## 2015-12-01 ENCOUNTER — Encounter: Payer: Self-pay | Admitting: Family Medicine

## 2015-12-01 DIAGNOSIS — F331 Major depressive disorder, recurrent, moderate: Secondary | ICD-10-CM

## 2015-12-01 MED ORDER — SERTRALINE HCL 25 MG PO TABS: 25.0000 mg | ORAL_TABLET | Freq: Every day | ORAL | 3 refills | Status: DC

## 2015-12-01 NOTE — Patient Instructions (Signed)
I have prescribed you zoloft 21m. I want to see how you are doing in about 4 weeks and consider going up on dosage if needed. Will also give some time to see if change in testosterone is helpful.   Taking the medicine as directed and not missing any doses is one of the best things you can do to treat your depression.  Here are some things to keep in mind:  1) Side effects (stomach upset, some increased anxiety) may happen before you notice a benefit.  These side effects typically go away over time. 2) Changes to your dose of medicine or a change in medication all together is sometimes necessary 3) Most people need to be on medication at least 6-12 months 4) Many people will notice an improvement within two weeks but the full effect of the medication can take up to 4-6 weeks 5) Stopping the medication when you start feeling better often results in a return of symptoms 6) If you start having thoughts of hurting yourself or others after starting this medicine, call our office immediately at [phone] seek care through 911.

## 2015-12-01 NOTE — Progress Notes (Signed)
Pre visit review using our clinic review tool, if applicable. No additional management support is needed unless otherwise documented below in the visit note. 

## 2015-12-01 NOTE — Progress Notes (Signed)
Ian Reddish, MD Phone: [phone]  Subjective:  Chief complaint-noted  Concern for Depression  HPI:  Onset/Duration (>2 weeks required): 4-6 months Factors: job stress, stressors with son  Symptoms (SIGECAPS):   1. Depressed Mood: Yes (required)  2. Decreased Interest: Yes (required)  3. SI/HI: No  4. PHQ9 14, GAD7 of 13  5. Level of Impairment (What do these symptoms get in the way of you doing) very diffiult- trouble sleeping and trouble with work  ROS- No SI/HI as above . Also No chest pain or shortness of breath. No headache or blurry vision.   Medical History including Psychiatric   1. Ever on psych meds: yes   Name of meds and # failures: 3- failures due to SE as noted below in a/p  2. Psych history/ever hospitalized: depression/anxiety only  3.  History of thyroid disease or anemia: no  Patient Active Problem List   Diagnosis Date Noted  . Thyroid nodule 02/01/2014    Priority: High  . Pulmonary embolism ? due to testosterone therapy 01/31/2014    Priority: High  . Ulcerative colitis ([place]) 03/04/1992    Priority: High  . History of DVT (deep vein thrombosis) 02/10/2014    Priority: Medium  . Mandible open fracture ([place]) 05/09/2012    Priority: Low    Class: Acute  . Open fracture facial bones ([place]) 05/09/2012    Priority: Low    Class: Acute  . 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 Hypogonadism in male 01/15/2007    Priority: Low  . Hypercoagulable state ([place]) 04/04/2015  . Pulmonary embolism ([place]) 04/04/2015  . DVT (deep venous thrombosis) ([place]) 04/04/2015  . DEPRESSION 01/15/2007  . Hyperlipemia 01/09/2007  . NEOP, MALIGNANT, SKIN, TRUNK 10/06/2006    Family history:  1. Any history bipolar: not per prior history. Son does deal with depression though.   Medications- reviewed and updated Current Outpatient Prescriptions  Medication Sig Dispense Refill  . rivaroxaban (XARELTO) 20 MG TABS tablet Take 1 tablet  (20 mg total) by mouth daily with supper. 30 tablet 5  . testosterone (ANDROGEL) 50 MG/5GM (1%) GEL Place 10 g onto the skin daily. 60 Tube 1  . sertraline (ZOLOFT) 25 MG tablet Take 1 tablet (25 mg total) by mouth daily. 30 tablet 3   No current facility-administered medications for this visit.     Objective: BP 120/82 (BP Location: Left Arm, Patient Position: Sitting, Cuff Size: Large)   Pulse 75   Temp 98.4 F (36.9 C) (Oral)   Wt 214 lb 12.8 oz (97.4 kg)   SpO2 96%   BMI 32.66 kg/m  Gen: NAD, resting comfortably CV: RRR no murmurs rubs or gallops Lungs: CTAB no crackles, wheeze, rhonchi Ext: no edema Skin: warm, dry Neuro: grossly normal, moves all extremities Psych: depressed mood, does not seem particularly anxious  Recent TSH:  Lab Results  Component Value Date   TSH 1.40 06/20/2015   Recent CBC (Hgb):  Lab Results  Component Value Date   HGB 14.6 08/23/2015   Recent CMET (baseline):   Chemistry      Component Value Date/Time   NA 139 04/04/2015 1245   K 4.2 04/04/2015 1245   CL 102 02/01/2014 0515   CO2 25 04/04/2015 1245   BUN 15.3 04/04/2015 1245   CREATININE 1.0 04/04/2015 1245      Component Value Date/Time   CALCIUM 9.6 04/04/2015 1245   ALKPHOS 67 04/04/2015 1245  AST 20 04/04/2015 1245   ALT 35 04/04/2015 1245   BILITOT 0.68 04/04/2015 1245      Assessment/Plan:  Concern for Depression -MDQ  not indicated -baseline labs within last year (TSH, CBC, CMP)largely normal- could have testosterone effect but unlikely primary cause -Baseline EKG in 2015 without QT prolongation  -Discussed remission may take several months and will reassesses medication/therapy needs after 6 months of remission.    Major depression ([place]) GAD7 of 13 and PHQ9 of 14. Likely GAD and depression. Zoloft should cover both. He wants to start low at 43m, strongly suspect will need 553mminimum and more likely 1003mReassess as below.    PHQ9 10-14: Moderate  Depression-discussed options of medication or counseling/CBT and patient opted for medication with zoloft at only 25 mg (declines 54m44mse to start). 6 week follow up.  Regarding medication, will assess safety (SI), tolerability (SE), and efficacy (repeat PHQ9) at next visit and titrate up as needed.  Regarding SI, patient denies but is also  able to contract for safety  The duration of face-to-face time during this visit was greater than 25 minutes. Greater than 50% of this time was spent in counseling about depression, potential for likely long term therapy, options for treatment, discussion of overlap with testosterone issues but that is not the likely primary cause of symptoms   StepMarin Olp

## 2015-12-02 NOTE — Assessment & Plan Note (Signed)
GAD7 of 13 and PHQ9 of 14. Likely GAD and depression. Zoloft should cover both. He wants to start low at 37m, strongly suspect will need 543mminimum and more likely 10015mReassess as below.    PHQ9 10-14: Moderate Depression-discussed options of medication or counseling/CBT and patient opted for medication with zoloft at only 25 mg (declines 8m68mse to start). 6 week follow up.  Regarding medication, will assess safety (SI), tolerability (SE), and efficacy (repeat PHQ9) at next visit and titrate up as needed.  Regarding SI, patient denies but is also  able to contract for safety

## 2015-12-04 ENCOUNTER — Ambulatory Visit: Payer: BLUE CROSS/BLUE SHIELD | Admitting: Endocrinology

## 2015-12-29 ENCOUNTER — Other Ambulatory Visit: Payer: Self-pay

## 2015-12-29 ENCOUNTER — Other Ambulatory Visit (INDEPENDENT_AMBULATORY_CARE_PROVIDER_SITE_OTHER): Payer: BLUE CROSS/BLUE SHIELD

## 2015-12-29 DIAGNOSIS — Z0000 Encounter for general adult medical examination without abnormal findings: Secondary | ICD-10-CM | POA: Diagnosis not present

## 2015-12-29 DIAGNOSIS — R7989 Other specified abnormal findings of blood chemistry: Secondary | ICD-10-CM | POA: Diagnosis not present

## 2015-12-29 LAB — HEPATIC FUNCTION PANEL
ALBUMIN: 4.4 g/dL (ref 3.5–5.2)
ALK PHOS: 54 U/L (ref 39–117)
ALT: 32 U/L (ref 0–53)
AST: 19 U/L (ref 0–37)
BILIRUBIN DIRECT: 0.1 mg/dL (ref 0.0–0.3)
TOTAL PROTEIN: 7.3 g/dL (ref 6.0–8.3)
Total Bilirubin: 0.7 mg/dL (ref 0.2–1.2)

## 2015-12-29 LAB — CBC WITH DIFFERENTIAL/PLATELET
BASOS ABS: 0 10*3/uL (ref 0.0–0.1)
Basophils Relative: 0.3 % (ref 0.0–3.0)
EOS ABS: 0.2 10*3/uL (ref 0.0–0.7)
Eosinophils Relative: 2.8 % (ref 0.0–5.0)
HEMATOCRIT: 48 % (ref 39.0–52.0)
HEMOGLOBIN: 16.3 g/dL (ref 13.0–17.0)
LYMPHS PCT: 40.5 % (ref 12.0–46.0)
Lymphs Abs: 2.5 10*3/uL (ref 0.7–4.0)
MCHC: 33.9 g/dL (ref 30.0–36.0)
MCV: 87.6 fl (ref 78.0–100.0)
MONOS PCT: 11.6 % (ref 3.0–12.0)
Monocytes Absolute: 0.7 10*3/uL (ref 0.1–1.0)
NEUTROS ABS: 2.8 10*3/uL (ref 1.4–7.7)
Neutrophils Relative %: 44.8 % (ref 43.0–77.0)
PLATELETS: 245 10*3/uL (ref 150.0–400.0)
RBC: 5.47 Mil/uL (ref 4.22–5.81)
RDW: 14.4 % (ref 11.5–15.5)
WBC: 6.3 10*3/uL (ref 4.0–10.5)

## 2015-12-29 LAB — BASIC METABOLIC PANEL
BUN: 17 mg/dL (ref 6–23)
CALCIUM: 9.6 mg/dL (ref 8.4–10.5)
CO2: 28 meq/L (ref 19–32)
CREATININE: 0.97 mg/dL (ref 0.40–1.50)
Chloride: 103 mEq/L (ref 96–112)
GFR: 87.82 mL/min (ref >60.00)
GLUCOSE: 99 mg/dL (ref 70–99)
Potassium: 4.2 mEq/L (ref 3.5–5.1)
Sodium: 137 mEq/L (ref 135–145)

## 2015-12-29 LAB — POC URINALSYSI DIPSTICK (AUTOMATED)
Bilirubin, UA: NEGATIVE
Blood, UA: NEGATIVE
GLUCOSE UA: NEGATIVE
KETONES UA: NEGATIVE
Leukocytes, UA: NEGATIVE
Nitrite, UA: NEGATIVE
Protein, UA: NEGATIVE
SPEC GRAV UA: 1.02
Urobilinogen, UA: 0.2
pH, UA: 6

## 2015-12-29 LAB — LIPID PANEL
Cholesterol: 208 mg/dL — ABNORMAL HIGH (ref 0–200)
HDL: 29.5 mg/dL — AB (ref >39.00)
NONHDL: 178.72
Total CHOL/HDL Ratio: 7
Triglycerides: 239 mg/dL — ABNORMAL HIGH (ref 0.0–149.0)
VLDL: 47.8 mg/dL — ABNORMAL HIGH (ref 0.0–40.0)

## 2015-12-29 LAB — LDL CHOLESTEROL, DIRECT: Direct LDL: 148 mg/dL

## 2015-12-29 MED ORDER — SERTRALINE HCL 25 MG PO TABS: 25.0000 mg | ORAL_TABLET | Freq: Every day | ORAL | 3 refills | Status: DC

## 2016-01-01 LAB — TSH: TSH: 0.94 u[IU]/mL (ref 0.35–4.50)

## 2016-01-04 ENCOUNTER — Other Ambulatory Visit (INDEPENDENT_AMBULATORY_CARE_PROVIDER_SITE_OTHER): Payer: BLUE CROSS/BLUE SHIELD

## 2016-01-04 DIAGNOSIS — E291 Testicular hypofunction: Secondary | ICD-10-CM | POA: Diagnosis not present

## 2016-01-04 LAB — CBC
HCT: 47.8 % (ref 39.0–52.0)
Hemoglobin: 15.9 g/dL (ref 13.0–17.0)
MCHC: 33.3 g/dL (ref 30.0–36.0)
MCV: 88.8 fl (ref 78.0–100.0)
PLATELETS: 246 10*3/uL (ref 150.0–400.0)
RBC: 5.38 Mil/uL (ref 4.22–5.81)
RDW: 14.3 % (ref 11.5–15.5)
WBC: 6.9 10*3/uL (ref 4.0–10.5)

## 2016-01-08 ENCOUNTER — Encounter: Payer: Self-pay | Admitting: Family Medicine

## 2016-01-08 ENCOUNTER — Ambulatory Visit (INDEPENDENT_AMBULATORY_CARE_PROVIDER_SITE_OTHER): Payer: BLUE CROSS/BLUE SHIELD | Admitting: Family Medicine

## 2016-01-08 VITALS — BP 136/80 | HR 82 | Temp 98.5°F | Ht 68.25 in | Wt 222.2 lb

## 2016-01-08 DIAGNOSIS — I2699 Other pulmonary embolism without acute cor pulmonale: Secondary | ICD-10-CM

## 2016-01-08 DIAGNOSIS — K51919 Ulcerative colitis, unspecified with unspecified complications: Secondary | ICD-10-CM | POA: Diagnosis not present

## 2016-01-08 DIAGNOSIS — F3342 Major depressive disorder, recurrent, in full remission: Secondary | ICD-10-CM | POA: Diagnosis not present

## 2016-01-08 DIAGNOSIS — E041 Nontoxic single thyroid nodule: Secondary | ICD-10-CM

## 2016-01-08 DIAGNOSIS — E785 Hyperlipidemia, unspecified: Secondary | ICD-10-CM | POA: Diagnosis not present

## 2016-01-08 DIAGNOSIS — Z0000 Encounter for general adult medical examination without abnormal findings: Secondary | ICD-10-CM | POA: Diagnosis not present

## 2016-01-08 LAB — TESTOSTERONE, TOTAL, LC/MS: TESTOSTERONE, TOTAL: 605.5 ng/dL (ref 264.0–916.0)

## 2016-01-08 NOTE — Assessment & Plan Note (Signed)
Sees Dr. Dwyane Dee tomorrow- can get his opinion at that time- ? If can follow up exam only with normal TSH

## 2016-01-08 NOTE — Progress Notes (Signed)
Pre visit review using our clinic review tool, if applicable. No additional management support is needed unless otherwise documented below in the visit note. 

## 2016-01-08 NOTE — Progress Notes (Signed)
Phone: [phone]  Subjective:  Patient presents today for their annual physical. Chief complaint-noted.   See problem oriented charting- ROS- full  review of systems was completed and negative except for: fatigue and memory loss which has improved back on higher dose of testosterone  The following were reviewed and entered/updated in epic: Past Medical History:  Diagnosis Date  . Depression   . Hypogonadism male   . Ulcerative colitis, unspecified    Patient Active Problem List   Diagnosis Date Noted  . Thyroid nodule 02/01/2014    Priority: High  . Pulmonary embolism ? due to testosterone therapy 01/31/2014    Priority: High  . Ulcerative colitis ([place]) 03/04/1992    Priority: High  . History of DVT (deep vein thrombosis) 02/10/2014    Priority: Medium  . Major depression ([place]) 01/15/2007    Priority: Medium  . Hyperlipemia 01/09/2007    Priority: Medium  . Mandible open fracture ([place]) 05/09/2012    Priority: Low    Class: Acute  . Open fracture facial bones ([place]) 05/09/2012    Priority: Low    Class: Acute  . 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 Hypogonadism in male 01/15/2007    Priority: Low  . Hypercoagulable state ([place]) 04/04/2015  . Pulmonary embolism ([place]) 04/04/2015  . DVT (deep venous thrombosis) ([place]) 04/04/2015  . NEOP, MALIGNANT, SKIN, TRUNK 10/06/2006   Past Surgical History:  Procedure Laterality Date  . MANDIBULAR HARDWARE REMOVAL Bilateral 07/10/2012   Procedure: BILATERAL MANDIBULAR HARDWARE REMOVAL;  Surgeon: Jerrell Belfast, MD;  Location: [place];  Service: ENT;  Laterality: Bilateral;  . ORIF MANDIBULAR FRACTURE Left 05/09/2012   Procedure: OPEN REDUCTION INTERNAL FIXATION (ORIF) RIGHT ANTERIOR MANDIBULAR FRACTURE; Open Reduction Internal Fixation of Midface fracture; Reduction of septal fracture; arch bars; Repair of lacerations;  Surgeon: Jerrell Belfast, MD;  Location: [place];  Service: ENT;  Laterality: Left;    . TRACHEOSTOMY TUBE PLACEMENT N/A 05/09/2012   Procedure: TRACHEOSTOMY;  Surgeon: Jerrell Belfast, MD;  Location: [facility];  Service: ENT;  Laterality: N/A;    Family History  Problem Relation Age of Onset  . COPD Father   . Lung cancer      Medications- reviewed and updated Current Outpatient Prescriptions  Medication Sig Dispense Refill  . rivaroxaban (XARELTO) 20 MG TABS tablet Take 1 tablet (20 mg total) by mouth daily with supper. 30 tablet 5  . sertraline (ZOLOFT) 25 MG tablet Take 1 tablet (25 mg total) by mouth daily. 30 tablet 3  . testosterone (ANDROGEL) 50 MG/5GM (1%) GEL Place 10 g onto the skin daily. 60 Tube 1   No current facility-administered medications for this visit.     Allergies-reviewed and updated Allergies  Allergen Reactions  . Metronidazole Other (See Comments)    REACTION: Fatigue    Social History   Social History  . Marital status: Divorced    Spouse name: N/A  . Number of children: N/A  . Years of education: N/A   Social History Main Topics  . Smoking status: Never Smoker  . Smokeless tobacco: Never Used  . Alcohol use Yes     Comment: OCCASIONALLY  . Drug use: No  . Sexual activity: Yes   Other Topics Concern  . None   Social History Narrative  . None    Objective: BP 136/80 (BP Location: Left Arm, Patient Position: Sitting, Cuff Size: Large)   Pulse 82   Temp 98.5 F (  36.9 C) (Oral)   Ht 5' 8.25" (1.734 m)   Wt 222 lb 3.2 oz (100.8 kg)   SpO2 96%   BMI 33.54 kg/m  Gen: NAD, resting comfortably HEENT: Mucous membranes are moist. Oropharynx normal Neck: no thyromegaly CV: RRR no murmurs rubs or gallops Lungs: CTAB no crackles, wheeze, rhonchi Abdomen: soft/nontender/nondistended/normal bowel sounds. No rebound or guarding.overweight  Ext: no edema Skin: warm, dry Neuro: grossly normal, moves all extremities, PERRLA Rectal: normal tone, normal sized prostate, no masses or tenderness  Assessment/Plan:  50 y.o. male  presenting for annual physical.  Health Maintenance counseling: 1. Anticipatory guidance: Patient counseled regarding regular dental exams, eye exams, wearing seatbelts.  2. Risk factor reduction:  Advised patient of need for regular exercise and diet rich and fruits and vegetables to reduce risk of heart attack and stroke. Not exercising at present- encouraged to start, he states very active at work. States eats a lot of salad 3. Immunizations/screenings/ancillary studies Immunization History  Administered Date(s) Administered  . DTP 10/06/2006  . Influenza,inj,Quad PF,36+ Mos 02/02/2014  . Influenza-Unspecified 11/03/2014  . Td 03/05/1991, 10/06/2006  . Tdap 05/09/2012   Health Maintenance Due  Topic Date Due  . HIV Screening - consider future labs 01/20/1983   4. Prostate cancer screening- now back on testosterone therapy- would advise getting PSA next year. Rectal low risk exam Lab Results  Component Value Date   PSA 1.12 05/10/2013   5. Colon cancer screening - gets colonoscopies due to ulcerative colitis  Status of chronic or acute concerns   Hyperlipemia 4.9% 10 year risk per ascvd risk estimator, advised weight loss, regular exercise to lower risk of need for statin in future  Also his BP is slightly high diastolic- discussed dash eating plan  Major depression (HCC) GAD and depression- GAD7 of 13 and PHQ9 of 14. Started zoloft 42m 6 weeks ago- had advised higher starting point but he declined. Doing much better with scores of 1 on phq9 and 0 on gad7.   Drastic improvement- continue low dose sertraline  Ulcerative colitis Advised GI follow up- he states no blood or abdominal pain and aysmptomatic and declines once again- agrees to consider at age [age]at latest  Thyroid nodule Sees Dr. KDwyane Deetomorrow- can get his opinion at that time- ? If can follow up exam only with normal TSH  Pulmonary embolism ? due to testosterone therapy As long as on testosterone again- continue  xarelto indefinitely   6 months or sooner if needed  Return precautions advised.   SGarret Reddish MD

## 2016-01-08 NOTE — Assessment & Plan Note (Signed)
4.9% 10 year risk per ascvd risk estimator, advised weight loss, regular exercise to lower risk of need for statin in future  Also his BP is slightly high diastolic- discussed dash eating plan

## 2016-01-08 NOTE — Assessment & Plan Note (Signed)
GAD and depression- GAD7 of 13 and PHQ9 of 14. Started zoloft 45m 6 weeks ago- had advised higher starting point but he declined. Doing much better with scores of 1 on phq9 and 0 on gad7.   Drastic improvement- continue low dose sertraline

## 2016-01-08 NOTE — Assessment & Plan Note (Addendum)
Advised GI follow up- he states no blood or abdominal pain and aysmptomatic and declines once again- agrees to consider at age 50 at latest

## 2016-01-08 NOTE — Patient Instructions (Addendum)
Let's target over the next year getting weight back under 200 at least  Stay on sertraline- you are doing much better!   When you see Dr. Dwyane Dee- can you ask his opinion about prior thyroid nodule. Considered repeating this scan but he may think he can follow it clinically by exam

## 2016-01-08 NOTE — Assessment & Plan Note (Signed)
As long as on testosterone again- continue xarelto indefinitely

## 2016-01-09 ENCOUNTER — Encounter: Payer: Self-pay | Admitting: Endocrinology

## 2016-01-09 ENCOUNTER — Ambulatory Visit (INDEPENDENT_AMBULATORY_CARE_PROVIDER_SITE_OTHER): Payer: BLUE CROSS/BLUE SHIELD | Admitting: Endocrinology

## 2016-01-09 VITALS — BP 140/90 | HR 67 | Ht 68.0 in | Wt 221.0 lb

## 2016-01-09 DIAGNOSIS — E042 Nontoxic multinodular goiter: Secondary | ICD-10-CM

## 2016-01-09 DIAGNOSIS — E291 Testicular hypofunction: Secondary | ICD-10-CM

## 2016-01-09 NOTE — Patient Instructions (Signed)
[facility]

## 2016-01-09 NOTE — Progress Notes (Signed)
Patient ID: [name], male   DOB: Apr 27, 1965, 50 y.o.   MRN: [id number]           Referring physician: Yong Channel  Reason for visit: Follow-up of low testosterone  History of Present Illness  Prior history: Hypogonadismwas diagnosed probably about 10 years ago but details are not available At the time of diagnosis he was having complaints of moodiness, decreased libido, tiredness and low motivation. He was told to have a low testosterone and initially started on AndroGel He did not feel any better with this and he thinks his levels of testosterone were not improved Subsequently was put on testosterone injections, 200 mg every 3 weeks with improvement in his symptoms and he had less moodiness, fatigue and increased libido although not completely back to normal Apparently when he had his pulmonary embolism in 11/15 he was told to stop his testosterone injections At that time his hemoglobin level was 16, previously had been about 14  Prior to his initial consultation he had complaints ofprogressive fatigue, decreased motivation, moodiness, decreased libido.   He also thinks he has gained weight  Evaluation showed is free testosterone level to be low at 6.0 LH was normal at 6.3  He was recommended to trial of clomiphene because of his history of increased hematocrit with testosterone but he subjectively did not feel better with this and he stopped this, testosterone did not appear to be improved  RECENT HISTORY: He was switched to AndroGel 1% each appears to be the only drug covered by his insurance. Since his level was still low with using 1 packet daily he was increased to 2 packets daily on his visit in 9/17 and has used this for about 5 weeks prior to his lab work  Patient thinks that he is feeling much better starting a couple of days after increasing the dose  with his mood and libido, has normal energy level and overall feels like he is back to normal overall Testosterone  level is markedly improved at 605 He has been regular with his testosterone application  Previous weight history: Range 198-226   Wt Readings from Last 3 Encounters:  01/09/16 221 lb (100.2 kg)  01/08/16 222 lb 3.2 oz (100.8 kg)  12/01/15 214 lb 12.8 oz (97.4 kg)    Prior lab results showtestosterone levels:  Lab Results  Component Value Date   TESTOSTERONE 605.5 01/04/2016   TESTOSTERONE 189.01 (L) 11/24/2015   TESTOSTERONE 205 08/24/2015   TESTOSTERONE 633 05/10/2013    Prolactin level: Normal   Lab Results  Component Value Date   LH 6.33 06/20/2015           Medication List       Accurate as of 01/09/16  4:06 PM. Always use your most recent med list.          rivaroxaban 20 MG Tabs tablet Commonly known as:  XARELTO Take 1 tablet (20 mg total) by mouth daily with supper.   sertraline 25 MG tablet Commonly known as:  ZOLOFT Take 1 tablet (25 mg total) by mouth daily.   testosterone 50 MG/5GM (1%) Gel Commonly known as:  ANDROGEL Place 10 g onto the skin daily.       Allergies:  Allergies  Allergen Reactions  . Metronidazole Other (See Comments)    REACTION: Fatigue    Past Medical History:  Diagnosis Date  . Depression   . Hypogonadism male   . Ulcerative colitis, unspecified     Past Surgical History:  Procedure Laterality Date  . MANDIBULAR HARDWARE REMOVAL Bilateral 07/10/2012   Procedure: BILATERAL MANDIBULAR HARDWARE REMOVAL;  Surgeon: Jerrell Belfast, MD;  Location: [place];  Service: ENT;  Laterality: Bilateral;  . ORIF MANDIBULAR FRACTURE Left 05/09/2012   Procedure: OPEN REDUCTION INTERNAL FIXATION (ORIF) RIGHT ANTERIOR MANDIBULAR FRACTURE; Open Reduction Internal Fixation of Midface fracture; Reduction of septal fracture; arch bars; Repair of lacerations;  Surgeon: Jerrell Belfast, MD;  Location: [place];  Service: ENT;  Laterality: Left;  . TRACHEOSTOMY TUBE PLACEMENT N/A 05/09/2012   Procedure: TRACHEOSTOMY;  Surgeon: Jerrell Belfast, MD;  Location: [facility];  Service: ENT;  Laterality: N/A;    Family History  Problem Relation Age of Onset  . COPD Father   . Lung cancer      Social History:  reports that he has never smoked. He has never used smokeless tobacco. He reports that he drinks alcohol. He reports that he does not use drugs.  Review of Systems    THYROID nodules:  His PCP has requested Review of his thyroid history today Apparently on a CT scan he had a thyroid nodule detected in 2015 Ultrasound subsequently showed mostly cystic small nodules bilaterally with the largest being a complex cyst of about 11 mm on the left side  He is still on Xarelto and he is going to continue this indefinitely for prophylaxis  General Examination:   BP 140/90   Pulse 67   Ht 5' 8"  (1.727 m)   Wt 221 lb (100.2 kg)   SpO2 98%   BMI 33.60 kg/m    Thyroid not palpable No lymphadenopathy in the neck  Assessment/ Plan:   Hypogonadotropic hypogonadism  He has had persistently low levels with trial of low-dose AndroGel and also previously clomiphene as well as significant fatigue and mood changes. Baseline free testosterone levels was only slightly low however  Now with his testosterone level being 600 using 10 g of AndroGel he is feeling very significantly better subjectively  Since the level has gone up for a significantly in the last 5 weeks will need to do another follow-up level in 6-8 weeks to make sure it is not getting higher along with monitoring his CBC Since he is taking Xarelto a relatively increase in his hematocrit may not be as relevant now  He is concerned about continuing this next year since he will be being her deductible for his medications Discussed that he [facility] than go to injections again  THYROID nodules: His ultrasound in 2015 showed mostly cystic subcentimeter nodules and the largest being 11 mm not meeting criteria for biopsy. Does not  feel any nodules on exam today and most likely this finding was incidental Will continue to follow clinically  Total visit time for multiple problems = 25 minutes   [name] 01/09/2016, 4:06 PM

## 2016-01-27 ENCOUNTER — Other Ambulatory Visit: Payer: Self-pay | Admitting: Endocrinology

## 2016-01-31 ENCOUNTER — Other Ambulatory Visit: Payer: Self-pay | Admitting: Endocrinology

## 2016-01-31 NOTE — Telephone Encounter (Signed)
Pt needs his Androgel sent in to [facility] ASAP, he is completely out and [facility].  Please fill as soon as you can.

## 2016-02-01 ENCOUNTER — Other Ambulatory Visit: Payer: Self-pay

## 2016-02-01 MED ORDER — TESTOSTERONE 50 MG/5GM (1%) TD GEL: TRANSDERMAL | 1 refills | Status: DC

## 2016-02-01 NOTE — Telephone Encounter (Signed)
Filled 02/01/16

## 2016-02-08 ENCOUNTER — Encounter: Admission: RE | Payer: Self-pay | Source: Ambulatory Visit

## 2016-02-08 ENCOUNTER — Ambulatory Visit: Admission: RE | Admit: 2016-02-08 | Payer: 59 | Source: Ambulatory Visit | Admitting: Orthopaedic Surgery

## 2016-02-08 SURGERY — DISCECTOMY, ANTERIOR CERVICAL, FUSION, LEVELS 2
Anesthesia: General | Site: Spine Cervical

## 2016-02-20 ENCOUNTER — Other Ambulatory Visit (INDEPENDENT_AMBULATORY_CARE_PROVIDER_SITE_OTHER): Payer: BLUE CROSS/BLUE SHIELD

## 2016-02-20 DIAGNOSIS — E291 Testicular hypofunction: Secondary | ICD-10-CM

## 2016-02-20 LAB — CBC
HEMATOCRIT: 48.5 % (ref 39.0–52.0)
Hemoglobin: 16.3 g/dL (ref 13.0–17.0)
MCHC: 33.7 g/dL (ref 30.0–36.0)
MCV: 87 fl (ref 78.0–100.0)
PLATELETS: 237 10*3/uL (ref 150.0–400.0)
RBC: 5.57 Mil/uL (ref 4.22–5.81)
RDW: 14.2 % (ref 11.5–15.5)
WBC: 6.9 10*3/uL (ref 4.0–10.5)

## 2016-02-22 LAB — TESTOSTERONE, FREE, TOTAL, SHBG
Sex Hormone Binding: 20.3 nmol/L (ref 16.5–55.9)
Testosterone, Free: 20.4 pg/mL (ref 6.8–21.5)
Testosterone: 679 ng/dL (ref 264–916)

## 2016-03-06 NOTE — Progress Notes (Signed)
Please let patient know that the testosterone level is relatively higher at 679, also hemoglobin blood count is slightly higher; need to reduce the AndroGel to 1-1/2 packets a day instead of 2

## 2016-03-07 ENCOUNTER — Telehealth: Payer: Self-pay | Admitting: Endocrinology

## 2016-03-07 NOTE — Telephone Encounter (Signed)
[employer] Call: Caller states that he is returning a call from [place]. He told him he had his lab results.

## 2016-03-10 ENCOUNTER — Other Ambulatory Visit: Payer: Self-pay | Admitting: Family Medicine

## 2016-04-04 ENCOUNTER — Telehealth: Payer: Self-pay | Admitting: Endocrinology

## 2016-04-04 NOTE — Telephone Encounter (Signed)
Patient has questions medication testosterone (ANDROGEL) 50 MG/5GM (1%) GEL when Dwyane Dee changed meds from two packets to one and a half packets he stated feeling bag again. Please advise

## 2016-04-04 NOTE — Telephone Encounter (Signed)
Would like him to come in for a testosterone test to confirm what the level is before changing again

## 2016-04-05 ENCOUNTER — Other Ambulatory Visit: Payer: Self-pay | Admitting: Endocrinology

## 2016-04-05 DIAGNOSIS — E291 Testicular hypofunction: Secondary | ICD-10-CM

## 2016-04-05 NOTE — Telephone Encounter (Signed)
I contacted the patient and advised of message via voicemail. Requested a call back from the patient to schedule his lab tests.

## 2016-04-05 NOTE — Telephone Encounter (Signed)
Pt called back, advised him of message and schedule him for a lab appointment Monday morning.

## 2016-04-08 ENCOUNTER — Encounter: Payer: Self-pay | Admitting: Endocrinology

## 2016-04-08 ENCOUNTER — Other Ambulatory Visit (INDEPENDENT_AMBULATORY_CARE_PROVIDER_SITE_OTHER): Payer: BLUE CROSS/BLUE SHIELD

## 2016-04-08 DIAGNOSIS — E291 Testicular hypofunction: Secondary | ICD-10-CM

## 2016-04-08 LAB — TESTOSTERONE: TESTOSTERONE: 407.89 ng/dL (ref 300.00–890.00)

## 2016-04-24 NOTE — Telephone Encounter (Signed)
Patient is calling on the status of medication Testosterone Gel he would like the levels changed back to two packets, it worked perfect for him'  One packet didn't work for him, all the bad symptoms came back.  Please advise

## 2016-05-05 ENCOUNTER — Other Ambulatory Visit: Payer: Self-pay | Admitting: Endocrinology

## 2016-05-05 DIAGNOSIS — R5383 Other fatigue: Secondary | ICD-10-CM

## 2016-05-05 DIAGNOSIS — E291 Testicular hypofunction: Secondary | ICD-10-CM

## 2016-05-07 ENCOUNTER — Other Ambulatory Visit (INDEPENDENT_AMBULATORY_CARE_PROVIDER_SITE_OTHER): Payer: BLUE CROSS/BLUE SHIELD

## 2016-05-07 DIAGNOSIS — R5383 Other fatigue: Secondary | ICD-10-CM | POA: Diagnosis not present

## 2016-05-07 DIAGNOSIS — E291 Testicular hypofunction: Secondary | ICD-10-CM | POA: Diagnosis not present

## 2016-05-07 LAB — CBC
HEMATOCRIT: 50.1 % (ref 39.0–52.0)
Hemoglobin: 16.9 g/dL (ref 13.0–17.0)
MCHC: 33.6 g/dL (ref 30.0–36.0)
MCV: 87.7 fl (ref 78.0–100.0)
PLATELETS: 231 10*3/uL (ref 150.0–400.0)
RBC: 5.71 Mil/uL (ref 4.22–5.81)
RDW: 14.8 % (ref 11.5–15.5)
WBC: 6.5 10*3/uL (ref 4.0–10.5)

## 2016-05-07 LAB — TESTOSTERONE: Testosterone: 418.45 ng/dL (ref 300.00–890.00)

## 2016-05-10 ENCOUNTER — Ambulatory Visit (INDEPENDENT_AMBULATORY_CARE_PROVIDER_SITE_OTHER): Payer: BLUE CROSS/BLUE SHIELD | Admitting: Endocrinology

## 2016-05-10 ENCOUNTER — Encounter: Payer: Self-pay | Admitting: Endocrinology

## 2016-05-10 VITALS — BP 150/108 | HR 86 | Ht 68.0 in | Wt 230.0 lb

## 2016-05-10 DIAGNOSIS — E291 Testicular hypofunction: Secondary | ICD-10-CM | POA: Diagnosis not present

## 2016-05-10 NOTE — Patient Instructions (Signed)
Stay on 2 tubes daily

## 2016-05-10 NOTE — Progress Notes (Signed)
Patient ID: [name], male   DOB: 23-Mar-1965, 51 y.o.   MRN: [id number]           Referring physician: Yong Channel  Reason for visit: Follow-up of low testosterone  History of Present Illness  Prior history: Hypogonadismwas diagnosed probably about 10 years ago but details are not available At the time of diagnosis he was having complaints of moodiness, decreased libido, tiredness and low motivation. He was told to have a low testosterone and initially started on AndroGel He did not feel any better with this and he thinks his levels of testosterone were not improved Subsequently was put on testosterone injections, 200 mg every 3 weeks with improvement in his symptoms and he had less moodiness, fatigue and increased libido although not completely back to normal Apparently when he had his pulmonary embolism in 11/15 he was told to stop his testosterone injections At that time his hemoglobin level was 16, previously had been about 14  Prior to his initial consultation he had complaints ofprogressive fatigue, decreased motivation, moodiness, decreased libido.   He also thinks he has gained weight  Evaluation showed is free testosterone level to be low at 6.0 LH was normal at 6.3  He was recommended to trial of clomiphene because of his history of increased hematocrit with testosterone but he subjectively did not feel better with this and he stopped this, testosterone did not appear to be improved  RECENT HISTORY: He was switched to AndroGel 1% each appears to be the only drug covered by his insurance. Since his level was still low with using 1 packet daily he was increased to 2 packets daily on his visit in 9/17   Subsequently because of his levels continuing to increase and being nearly 700 and was told to try one half packets However he thinks that with 7.5 g he right away started feeling more fatigued  As above with Using AndroGel 1% his levels have been variable; he appears to  be subjectively feeling better when his levels are over 600 More recently with reducing the dose to 7.5 g his level has been in the low 400s and he has had more fatigue; he has been back on the 10 g for the last 2 weeks or so without subjective improvement  He has been regular with his testosterone application daily  Previous weight history: Range 198-226   Wt Readings from Last 3 Encounters:  05/10/16 230 lb (104.3 kg)  01/09/16 221 lb (100.2 kg)  01/08/16 222 lb 3.2 oz (100.8 kg)    Prior lab results showtestosterone levels:  Lab Results  Component Value Date   TESTOSTERONE 418.45 05/07/2016   TESTOSTERONE 407.89 04/08/2016   TESTOSTERONE 679 02/20/2016   TESTOSTERONE 605.5 01/04/2016    Prolactin level: Normal   Lab Results  Component Value Date   LH 6.33 06/20/2015         Allergies as of 05/10/2016      Reactions   Metronidazole Other (See Comments)   REACTION: Fatigue      Medication List       Accurate as of 05/10/16  4:33 PM. Always use your most recent med list.          sertraline 25 MG tablet Commonly known as:  ZOLOFT Take 1 tablet (25 mg total) by mouth daily.   testosterone 50 MG/5GM (1%) Gel Commonly known as:  ANDROGEL PLACE 10GM (2 TUBES) ONTO SKIN DAILY   XARELTO 20 MG Tabs tablet Generic drug:  rivaroxaban TAKE 1 TABLET BY MOUTH EVERY DAY WITH SUPPER       Allergies:  Allergies  Allergen Reactions  . Metronidazole Other (See Comments)    REACTION: Fatigue    Past Medical History:  Diagnosis Date  . Depression   . Hypogonadism male   . Ulcerative colitis, unspecified     Past Surgical History:  Procedure Laterality Date  . MANDIBULAR HARDWARE REMOVAL Bilateral 07/10/2012   Procedure: BILATERAL MANDIBULAR HARDWARE REMOVAL;  Surgeon: Jerrell Belfast, MD;  Location: [place];  Service: ENT;  Laterality: Bilateral;  . ORIF MANDIBULAR FRACTURE Left 05/09/2012   Procedure: OPEN REDUCTION INTERNAL FIXATION (ORIF) RIGHT ANTERIOR  MANDIBULAR FRACTURE; Open Reduction Internal Fixation of Midface fracture; Reduction of septal fracture; arch bars; Repair of lacerations;  Surgeon: Jerrell Belfast, MD;  Location: [place];  Service: ENT;  Laterality: Left;  . TRACHEOSTOMY TUBE PLACEMENT N/A 05/09/2012   Procedure: TRACHEOSTOMY;  Surgeon: Jerrell Belfast, MD;  Location: [facility] OR;  Service: ENT;  Laterality: N/A;    Family History  Problem Relation Age of Onset  . COPD Father   . Lung cancer      Social History:  reports that he has never smoked. He has never used smokeless tobacco. He reports that he drinks alcohol. He reports that he does not use drugs.  Review of Systems   Sleepiness:  He now is complaining of fatigue and daytime sleepiness as well as difficulty with clear thinking and remembering He says he has significant snoring and some choking at night Apparently falls asleep very easily also Has not had sleep apnea evaluation  THYROID nodules:  Apparently on a CT scan he had a thyroid nodule detected in 2015 Ultrasound subsequently showed mostly cystic small nodules bilaterally with the largest being a complex cyst of about 11 mm on the left side  He is still on Xarelto and he is going to continue this indefinitely for prophylaxis  General Examination:   BP (!) 150/108   Pulse 86   Ht 5' 8"  (1.727 m)   Wt 230 lb (104.3 kg)   SpO2 92%   BMI 34.97 kg/m    Thyroid not palpable No lymphadenopathy in the neck  Assessment/ Plan:   Hypogonadotropic hypogonadism  He had persistently low levels with trial of low-dose AndroGel and also previously clomiphene as well as significant fatigue and mood changes. Baseline free testosterone levels was only slightly low however  Now with Using AndroGel 1% his levels have been variable; he appears to be subjectively feeling better when his levels are over 600 More recently with reducing the dose to 7.5 g his level has been in the low 400s and he has had more fatigue; he  has been back on the 10 g for the last 2 weeks or so without subjective improvement  Also he has a trend towards a higher hemoglobin again  Recommendations: As discussed above he likely has sleep apnea causing his symptoms of somnolence and fatigue He will need to discuss this with his PCP Minimally can either continue 10 g of AndroGel or try Testim if it is covered   [facility] 05/10/2016, 4:33 PM

## 2016-05-13 ENCOUNTER — Telehealth: Payer: Self-pay | Admitting: Family Medicine

## 2016-05-13 MED ORDER — TESTOSTERONE 50 MG/5GM (1%) TD GEL: 10.0000 g | Freq: Every day | TRANSDERMAL | 1 refills | Status: DC

## 2016-05-13 NOTE — Telephone Encounter (Signed)
Yes thanks may refer to pulmonology under snoring

## 2016-05-13 NOTE — Telephone Encounter (Signed)
Pt calling stating that Dr. Dwyane Dee suggested that he have a sleep study done and would like for you to do the referral so that this can be done.

## 2016-05-13 NOTE — Addendum Note (Signed)
Addended by: Elayne Snare on: 05/13/2016 08:17 AM   Modules accepted: Orders

## 2016-05-14 ENCOUNTER — Other Ambulatory Visit: Payer: Self-pay

## 2016-05-14 DIAGNOSIS — R0683 Snoring: Secondary | ICD-10-CM

## 2016-05-14 NOTE — Telephone Encounter (Signed)
Placed referral and called patient. Left a voicemail asking for a return phone call to let him know

## 2016-05-15 NOTE — Telephone Encounter (Signed)
Spoke with patient and made him aware that pulmonology will call to schedule

## 2016-05-17 ENCOUNTER — Encounter: Payer: Self-pay | Admitting: Internal Medicine

## 2016-07-03 ENCOUNTER — Encounter: Payer: Self-pay | Admitting: Internal Medicine

## 2016-07-03 ENCOUNTER — Ambulatory Visit (INDEPENDENT_AMBULATORY_CARE_PROVIDER_SITE_OTHER): Payer: BLUE CROSS/BLUE SHIELD | Admitting: Internal Medicine

## 2016-07-03 VITALS — BP 118/74 | HR 66 | Ht 68.0 in | Wt 221.8 lb

## 2016-07-03 DIAGNOSIS — G4730 Sleep apnea, unspecified: Secondary | ICD-10-CM | POA: Diagnosis not present

## 2016-07-03 DIAGNOSIS — I2699 Other pulmonary embolism without acute cor pulmonale: Secondary | ICD-10-CM | POA: Diagnosis not present

## 2016-07-03 DIAGNOSIS — G4733 Obstructive sleep apnea (adult) (pediatric): Secondary | ICD-10-CM | POA: Diagnosis not present

## 2016-07-03 DIAGNOSIS — J342 Deviated nasal septum: Secondary | ICD-10-CM

## 2016-07-03 NOTE — Assessment & Plan Note (Signed)
Right side is more narrow anteriorly but not obstructed and he doesn't recognize a great deal of difference between the 2 sides of his nose.
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
## 2016-07-03 NOTE — Assessment & Plan Note (Signed)
High probability diagnosis based on physical exam and history. We discussed basic physiology, associated medical concerns, testing process and responsibility to drive safely. Plan-schedule sleep study

## 2016-07-03 NOTE — Patient Instructions (Signed)
Order- schedule unattended home sleep test  My staff will schedule a return visit for you to go over the results  Please call as needed

## 2016-07-03 NOTE — Assessment & Plan Note (Signed)
He remains on Xarelto with no acute issues.

## 2016-07-08 ENCOUNTER — Other Ambulatory Visit: Payer: Self-pay

## 2016-07-08 MED ORDER — SERTRALINE HCL 25 MG PO TABS
25.0000 mg | ORAL_TABLET | Freq: Every day | ORAL | 3 refills | Status: DC
Start: 2016-07-08 — End: 2016-10-25

## 2016-07-17 DIAGNOSIS — G4733 Obstructive sleep apnea (adult) (pediatric): Secondary | ICD-10-CM | POA: Diagnosis not present

## 2016-07-24 DIAGNOSIS — G4733 Obstructive sleep apnea (adult) (pediatric): Secondary | ICD-10-CM | POA: Diagnosis not present

## 2016-07-25 ENCOUNTER — Other Ambulatory Visit: Payer: Self-pay | Admitting: *Deleted

## 2016-07-25 DIAGNOSIS — G4730 Sleep apnea, unspecified: Secondary | ICD-10-CM

## 2016-07-31 ENCOUNTER — Telehealth: Payer: Self-pay | Admitting: Internal Medicine

## 2016-07-31 DIAGNOSIS — G4733 Obstructive sleep apnea (adult) (pediatric): Secondary | ICD-10-CM

## 2016-07-31 NOTE — Telephone Encounter (Signed)
I don't find him in lobby. Staff was supposed to make him a return visit to discuss results of this sleep study. 'HST shows severe obstructive sleep apnea for which the best treatment would be CPAP. If he is willing to go ahead- please order new DME, new CPAP auto 5-20, mask of chice, humidifier, supplies, AirView for dx OSA.  He needs a new definite appointment back with me for CPAP follow-up within 90 days.

## 2016-07-31 NOTE — Telephone Encounter (Signed)
Spoke with patient regarding the sleep study. He stated that he received his results via MyChart last night and was confused by the numbers and charts. He wanted to know what his results were.   Dr. Annamaria Boots, can you please advise on his results? Thanks.

## 2016-07-31 NOTE — Telephone Encounter (Signed)
Spoke with pt, aware of recs.  cpap ordered, rov scheduled.  Nothing further needed.

## 2016-08-10 ENCOUNTER — Other Ambulatory Visit: Payer: Self-pay | Admitting: Endocrinology

## 2016-08-12 DIAGNOSIS — G4733 Obstructive sleep apnea (adult) (pediatric): Secondary | ICD-10-CM | POA: Diagnosis not present

## 2016-08-14 ENCOUNTER — Other Ambulatory Visit: Payer: Self-pay | Admitting: Endocrinology

## 2016-08-14 ENCOUNTER — Other Ambulatory Visit: Payer: Self-pay

## 2016-08-20 ENCOUNTER — Telehealth: Payer: Self-pay

## 2016-08-20 NOTE — Telephone Encounter (Signed)
Called patient to make him aware that we received a denial for androgel and requested that he call his insurance company to se what is covered per Dr. Jodelle Green request- requested patient call us back when he finds out what is covered

## 2016-09-05 ENCOUNTER — Other Ambulatory Visit: Payer: Self-pay | Admitting: Family Medicine

## 2016-09-11 DIAGNOSIS — G4733 Obstructive sleep apnea (adult) (pediatric): Secondary | ICD-10-CM | POA: Diagnosis not present

## 2016-10-11 ENCOUNTER — Other Ambulatory Visit: Payer: Self-pay | Admitting: Endocrinology

## 2016-10-12 DIAGNOSIS — G4733 Obstructive sleep apnea (adult) (pediatric): Secondary | ICD-10-CM | POA: Diagnosis not present

## 2016-10-14 ENCOUNTER — Other Ambulatory Visit: Payer: Self-pay | Admitting: Endocrinology

## 2016-10-25 ENCOUNTER — Other Ambulatory Visit: Payer: Self-pay | Admitting: Family Medicine

## 2016-10-29 ENCOUNTER — Encounter: Payer: Self-pay | Admitting: Internal Medicine

## 2016-10-31 ENCOUNTER — Encounter: Payer: Self-pay | Admitting: Internal Medicine

## 2016-10-31 ENCOUNTER — Ambulatory Visit (INDEPENDENT_AMBULATORY_CARE_PROVIDER_SITE_OTHER): Payer: BLUE CROSS/BLUE SHIELD | Admitting: Internal Medicine

## 2016-10-31 VITALS — BP 118/78 | HR 71 | Ht 68.0 in | Wt 221.8 lb

## 2016-10-31 DIAGNOSIS — G4733 Obstructive sleep apnea (adult) (pediatric): Secondary | ICD-10-CM | POA: Diagnosis not present

## 2016-10-31 DIAGNOSIS — S02412S LeFort II fracture, sequela: Secondary | ICD-10-CM
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
## 2016-10-31 NOTE — Patient Instructions (Signed)
Order- DME [facility] - We can continue CPAP auto 5-20, mask of choice, humidifier, supplies      Dx OSA                  Please refit and replace mask of choice,                  Please put on AirView. If not available, please place SD card in machine  Please call if we can help

## 2016-11-03 NOTE — Assessment & Plan Note (Signed)
Download shows good compliance and control. We may be getting all of the sleep improvement we can expect from CPAP. He does benefit. Plan-reviewed sleep hygiene. Refit CPAP mask.

## 2016-11-03 NOTE — Assessment & Plan Note (Signed)
There is mild septal deviation without obvious obstruction. I did tell him that we could send him to ENT for current evaluation if he wished.

## 2016-11-12 DIAGNOSIS — G4733 Obstructive sleep apnea (adult) (pediatric): Secondary | ICD-10-CM | POA: Diagnosis not present

## 2016-12-12 DIAGNOSIS — G4733 Obstructive sleep apnea (adult) (pediatric): Secondary | ICD-10-CM | POA: Diagnosis not present

## 2016-12-26 ENCOUNTER — Other Ambulatory Visit: Payer: Self-pay

## 2016-12-26 MED ORDER — SERTRALINE HCL 25 MG PO TABS: 25.0000 mg | ORAL_TABLET | Freq: Every day | ORAL | 1 refills | Status: DC

## 2017-01-09 ENCOUNTER — Other Ambulatory Visit: Payer: Self-pay | Admitting: Endocrinology

## 2017-01-12 DIAGNOSIS — G4733 Obstructive sleep apnea (adult) (pediatric): Secondary | ICD-10-CM | POA: Diagnosis not present

## 2017-02-05 ENCOUNTER — Other Ambulatory Visit (INDEPENDENT_AMBULATORY_CARE_PROVIDER_SITE_OTHER): Payer: BLUE CROSS/BLUE SHIELD

## 2017-02-05 DIAGNOSIS — E291 Testicular hypofunction: Secondary | ICD-10-CM

## 2017-02-06 LAB — TESTOSTERONE: TESTOSTERONE: 195.82 ng/dL — AB (ref 300.00–890.00)

## 2017-02-07 ENCOUNTER — Ambulatory Visit (INDEPENDENT_AMBULATORY_CARE_PROVIDER_SITE_OTHER): Payer: BLUE CROSS/BLUE SHIELD | Admitting: Family Medicine

## 2017-02-07 ENCOUNTER — Encounter: Payer: Self-pay | Admitting: Endocrinology

## 2017-02-07 ENCOUNTER — Other Ambulatory Visit: Payer: Self-pay

## 2017-02-07 ENCOUNTER — Ambulatory Visit (INDEPENDENT_AMBULATORY_CARE_PROVIDER_SITE_OTHER): Payer: BLUE CROSS/BLUE SHIELD | Admitting: Endocrinology

## 2017-02-07 ENCOUNTER — Encounter: Payer: Self-pay | Admitting: Family Medicine

## 2017-02-07 VITALS — BP 136/96 | HR 92 | Ht 68.0 in | Wt 226.6 lb

## 2017-02-07 VITALS — BP 146/86 | HR 94 | Temp 97.8°F | Wt 227.4 lb

## 2017-02-07 DIAGNOSIS — F324 Major depressive disorder, single episode, in partial remission: Secondary | ICD-10-CM

## 2017-02-07 DIAGNOSIS — M79644 Pain in right finger(s): Secondary | ICD-10-CM | POA: Diagnosis not present

## 2017-02-07 DIAGNOSIS — E291 Testicular hypofunction: Secondary | ICD-10-CM | POA: Diagnosis not present

## 2017-02-07 DIAGNOSIS — M79645 Pain in left finger(s): Secondary | ICD-10-CM | POA: Diagnosis not present

## 2017-02-07 MED ORDER — ESCITALOPRAM OXALATE 5 MG PO TABS: 5.0000 mg | ORAL_TABLET | Freq: Every day | ORAL | 5 refills | Status: DC

## 2017-02-07 MED ORDER — DICLOFENAC SODIUM 1 % TD GEL: 2.0000 g | Freq: Four times a day (QID) | TRANSDERMAL | 1 refills | Status: DC

## 2017-02-07 NOTE — Progress Notes (Signed)
Subjective:  [name] is a 51 y.o. year old very pleasant male patient who presents for/with See problem oriented charting ROS- no fever, chills, nausea. No SI. Admits to depressed mood at times and anhedonia. Has concentration issues   Past Medical History-  Patient Active Problem List   Diagnosis Date Noted  . Thyroid nodule 02/01/2014    Priority: High  . Pulmonary embolism ? due to testosterone therapy 01/31/2014    Priority: High  . Ulcerative colitis ([place]) 03/04/1992    Priority: High  . History of DVT (deep vein thrombosis) 02/10/2014    Priority: Medium  . Depression, major, single episode, in partial remission ([place]) 01/15/2007    Priority: Medium  . Hyperlipemia 01/09/2007    Priority: Medium  . Mandible open fracture ([place]) 05/09/2012    Priority: Low    Class: Acute  . Open fracture facial bones ([place]) 05/09/2012    Priority: Low    Class: Acute  . 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 Hypogonadism in male 01/15/2007    Priority: Low  . Obstructive sleep apnea 07/03/2016  . Hypercoagulable state ([place]) 04/04/2015  . Pulmonary embolism ([place]) 04/04/2015  . DVT (deep venous thrombosis) ([place]) 04/04/2015  . 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 testosterone (ANDROGEL) 50 MG/5GM (1%) GEL APPLY 2 TUBES TO SKIN EVERY DAY 300 g 1  . XARELTO 20 MG TABS tablet TAKE 1 TABLET BY MOUTH EVERY DAY WITH SUPPER 30 tablet 5  . diclofenac sodium (VOLTAREN) 1 % GEL Apply 2 g topically 4 (four) times daily. 100 g 1  . escitalopram (LEXAPRO) 5 MG tablet Take 1 tablet (5 mg total) by mouth daily. 30 tablet 5   No current facility-administered medications for this visit.     Objective: BP (!) 146/86   Pulse 94   Temp 97.8 F (36.6 C) (Oral)   Wt 227 lb 6.4 oz (103.1 kg)   SpO2 96%   BMI 34.58 kg/m  Gen: NAD, resting comfortably CV:  regular rate Lungs: nonlabored Ext: no edema Skin: warm, dry Msk: right middle finger PIP joint mildly enlarged compared to other fingers of hand. Patient with some pain with opposition of thumb particularly against resistance  Assessment/Plan:  Pain of right middle finger Pain of both thumbs S: Jammed right middle finger 6 months ago- slightly swollen still with mild pain. 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 Pain mostly mild to moderate- not worsening.   In addition, has had Several weeks bilateral pain at base of thumb especially with oppositing thumbs- heavy grip in past but denies recent overuse. Once again- Pain mostly mild to moderate- not worsening.  A/P: suspect potential mild flare arthritis in middle finger. Likely muscular or tendon based in the thumbs- either way likely overuse. Advised thumb spica bilaterally and trial voltaren for all 3 areas- if fails to improve likely refer to Dr. Paulla Fore at follow up in 1 month for deprssion  Would need to use topical regardless given increased clotting risk- even if Voltaren not covered would look at other options  Depression, major, single episode, in partial remission [facility]) S: Patient is struggling with his depression for at least the last few months. He may have a really good week and then another week feels really down. He describes this as  mood swings. Does not have periods of not sleeping as much but still waking up refreshed or staying up doing tasks.   When he feels down he says he gets easily annoyed, withdraws more, feels depressed. States he analyzes the situation too much. Feels like hs is on roller coaster at times. When he is down states his focus decreases.   He also gets worried about his memory at times. ginko biloba seems to help some.   Denies SI.  A/P: PHQ9 today of 11 and GAD7 of 5 on zoloft 22m. We had tried very low dose per his request- not controlling  symptoms well but causing libido issues. We will change to lexapro 515mand then follow up 1 month. Suicide precautions/risks discussed. Could consider SNRI as well and if that is not helpful potentially refer to psychiatry. I doubt bipolar. Also wonder if some of symptoms will improve on higher testosterone dose- just increased today by Dr. KuDwyane Dee BP Readings from Last 3 Encounters:  02/07/17 (!) 146/86  02/07/17 (!) 136/96  10/31/16 118/78  BP elevated x2 today- has not been in the past- will monitor only for now- seems agitated about hiw health state with depression and hands  Future Appointments  Date Time Provider [place]1/09/2017 11:15 AM HuMarin OlpMD [facility] P[facility]  03/24/2017 10:45 AM [facility] LAB [facility] None  05/01/2017  4:00 PM YoBaird Lyons, MD [facility] None  08/06/2017  3:45 PM [facility] LAB [facility] None  08/11/2017 10:00 AM KuElayne SnareMD [facility] None   Meds ordered this encounter  Medications  . escitalopram (LEXAPRO) 5 MG tablet    Sig: Take 1 tablet (5 mg total) by mouth daily.    Dispense:  30 tablet    Refill:  5  . diclofenac sodium (VOLTAREN) 1 % GEL    Sig: Apply 2 g topically 4 (four) times daily.    Dispense:  100 g    Refill:  1   Return precautions advised.  StGarret ReddishMD

## 2017-02-07 NOTE — Progress Notes (Signed)
Patient ID: [name], male   DOB: [birth year], 51 y.o.   MRN: [id number]           Referring physician: Yong Channel  Reason for visit: Follow-up of low testosterone  History of Present Illness  Prior history: Hypogonadismwas diagnosed probably about 10 years ago but details are not available At the time of diagnosis he was having complaints of moodiness, decreased libido, tiredness and low motivation. He was told to have a low testosterone and initially started on AndroGel He did not feel any better with this and he thinks his levels of testosterone were not improved Subsequently was put on testosterone injections, 200 mg every 3 weeks with improvement in his symptoms and he had less moodiness, fatigue and increased libido although not completely back to normal Apparently when he had his pulmonary embolism in 11/15 he was told to stop his testosterone injections At that time his hemoglobin level was 16, previously had been about 14  Prior to his initial consultation he had complaints ofprogressive fatigue, decreased motivation, moodiness, decreased libido.   He also thinks he has gained weight  Evaluation showed is free testosterone level to be low at 6.0 LH was normal at 6.3  He was recommended to trial of clomiphene because of his history of increased hematocrit with testosterone but he subjectively did not feel better with this and he stopped this, testosterone did not appear to be improved  RECENT HISTORY:  He had persistently low levels with trial of clomiphene as well as significant fatigue and mood changes.  He has been continued on AndroGel 1% which appears to be the only drug covered by his insurance. He has needed 10 g or 2 packets daily because of low levels with 1 packet and also not adequate subjective improvement in fatigue with 7.5 g even though his testosterone level usually is over 400 He was last seen in March  Over the last couple of months he said he was  noticing that he had increased anger but not necessarily more depression or anxiety On his own he tried to take the AndroGel every other day and he felt his anger level to be better Also more recently in the last 3 or 4 weeks he has used only 1 packet daily  He does not think she feels any more tired than usual and his energy level is reasonably good He has had decreased libido for quite some time and recently fairly low level of interest However is also taking sertraline  He has been regular with his testosterone application daily  His testosterone level is now 196  Previous weight history: Range 198-226   Wt Readings from Last 3 Encounters:  02/07/17 226 lb 9.6 oz (102.8 kg)  10/31/16 221 lb 12.8 oz (100.6 kg)  07/03/16 221 lb 12.8 oz (100.6 kg)    Prior lab results showtestosterone levels:  Lab Results  Component Value Date   TESTOSTERONE 195.82 (L) 02/05/2017   TESTOSTERONE 418.45 05/07/2016   TESTOSTERONE 407.89 04/08/2016   TESTOSTERONE 679 02/20/2016    Prolactin level: Normal   Lab Results  Component Value Date   LH 6.33 06/20/2015         Allergies as of 02/07/2017      Reactions   Metronidazole Other (See Comments)   REACTION: Fatigue      Medication List        Accurate as of 02/07/17 10:53 AM. Always use your most recent med list.  sertraline 25 MG tablet Commonly known as:  ZOLOFT Take 1 tablet (25 mg total) by mouth daily.   testosterone 50 MG/5GM (1%) Gel Commonly known as:  ANDROGEL APPLY 2 TUBES TO SKIN EVERY DAY   XARELTO 20 MG Tabs tablet Generic drug:  rivaroxaban TAKE 1 TABLET BY MOUTH EVERY DAY WITH SUPPER       Allergies:  Allergies  Allergen Reactions  . Metronidazole Other (See Comments)    REACTION: Fatigue    Past Medical History:  Diagnosis Date  . Depression   . Hypogonadism male   . Ulcerative colitis, unspecified     Past Surgical History:  Procedure Laterality Date  . MANDIBULAR HARDWARE  REMOVAL Bilateral 07/10/2012   Procedure: BILATERAL MANDIBULAR HARDWARE REMOVAL;  Surgeon: Jerrell Belfast, MD;  Location: [place];  Service: ENT;  Laterality: Bilateral;  . ORIF MANDIBULAR FRACTURE Left 05/09/2012   Procedure: OPEN REDUCTION INTERNAL FIXATION (ORIF) RIGHT ANTERIOR MANDIBULAR FRACTURE; Open Reduction Internal Fixation of Midface fracture; Reduction of septal fracture; arch bars; Repair of lacerations;  Surgeon: Jerrell Belfast, MD;  Location: [place];  Service: ENT;  Laterality: Left;  . TRACHEOSTOMY TUBE PLACEMENT N/A 05/09/2012   Procedure: TRACHEOSTOMY;  Surgeon: Jerrell Belfast, MD;  Location: [facility] OR;  Service: ENT;  Laterality: N/A;    Family History  Problem Relation Age of Onset  . COPD Father   . Lung cancer Unknown     Social History:  reports that  has never smoked. he has never used smokeless tobacco. He reports that he drinks alcohol. He reports that he does not use drugs.  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, manufacturing systems Examination:   BP (!) 136/96   Pulse 92   Ht 5' 8"  (1.727 m)   Wt 226 lb 9.6 oz (102.8 kg)   SpO2 96%   BMI 34.45 kg/m   Exam not indicated   Assessment/ Plan:   Hypogonadotropic hypogonadism, idiopathic  Baseline free testosterone levels was only slightly low however  Although he had been maintained on a good regimen of 10 g of AndroGel 1% with subjective improvement in fatigue and mood and also therapeutic levels of over 400 the last couple of times his testosterone level is now below 200  This is from  his reducing the dose to about half the dose that he was taking before He says that he was having side effects of increased anger with taking before 10 g dose but did not try the 7.5 g doses Surprisingly he is not complaining of any increased fatigue recently with reducing his dose and the low testosterone level He has had persistently low libido also  Since his level is low and he may start getting more symptomatic over time recommended that he started using 1-1/2 packet daily of the AndroGel 1% to give 7.5 g daily He probably should try to change his sertraline to another drug to see if that will help his libido; also this is not apparently adequately controlling his anxiety  He will come back in 6 weeks for testosterone level again to confirm adequate dosage, otherwise follow-up in 6 months Also recommended that he follow his blood pressure regularly at least at work and with PCP   Elayne Snare 02/07/2017, 10:53 AM

## 2017-02-07 NOTE — Patient Instructions (Addendum)
Stop zoloft tomorrow Start lexapro/escitalopram tomorrow morning at 5 mg dose Let's follow up in 1 month to recheck  Use voltaren gel on 3 spots on hand bothering you (if this is expensive there is a mail order option we could try next week) Finger may be arthritis, other areas likely tendonitis Use splint on both hands as much as you can tolerate it Follow up 1 month  Taking the medicine as directed and not missing any doses is one of the best things you can do to treat your depression.  Here are some things to keep in mind:  1) Side effects (stomach upset, some increased anxiety) may happen before you notice a benefit.  These side effects typically go away over time. 2) Changes to your dose of medicine or a change in medication all together is sometimes necessary 3) Most people need to be on medication at least 6-12 months 4) Many people will notice an improvement within two weeks but the full effect of the medication can take up to 4-6 weeks 5) Stopping the medication when you start feeling better often results in a return of symptoms 6) If you start having thoughts of hurting yourself or others after starting this medicine, call our office immediately at [phone] or seek care through 911.

## 2017-02-07 NOTE — Patient Instructions (Signed)
Take 1 1/2 tubes daily

## 2017-02-08 ENCOUNTER — Encounter: Payer: Self-pay | Admitting: Family Medicine

## 2017-02-08 NOTE — Assessment & Plan Note (Addendum)
S: Patient is struggling with his depression for at least the last few months. He may have a really good week and then another week feels really down. He describes this as mood swings. Does not have periods of not sleeping as much but still waking up refreshed or staying up doing tasks.   When he feels down he says he gets easily annoyed, withdraws more, feels depressed. States he analyzes the situation too much. Feels like hs is on roller coaster at times. When he is down states his focus decreases.   He also gets worried about his memory at times. ginko biloba seems to help some.   Denies SI.  A/P: PHQ9 today of 11 and GAD7 of 5 on zoloft 46m. We had tried very low dose per his request- not controlling symptoms well but causing libido issues. We will change to lexapro 579mand then follow up 1 month. Suicide precautions/risks discussed. Could consider SNRI as well and if that is not helpful potentially refer to psychiatry. I doubt bipolar. Also wonder if some of symptoms will improve on higher testosterone dose- just increased today by Dr. KuDwyane DeeDiscussed once depression in better state could do some memory testing

## 2017-02-11 DIAGNOSIS — G4733 Obstructive sleep apnea (adult) (pediatric): Secondary | ICD-10-CM | POA: Diagnosis not present

## 2017-02-13 ENCOUNTER — Telehealth: Payer: Self-pay

## 2017-02-13 NOTE — Telephone Encounter (Signed)
Diclofenac gel PA approved through 03/03/2038.  Approval faxed to patient's pharmacy.

## 2017-02-19 ENCOUNTER — Telehealth: Payer: Self-pay | Admitting: Family Medicine

## 2017-02-19 NOTE — Telephone Encounter (Signed)
MEDICATION: sertraline HCL 25 mg tablet  PHARMACY:   [id number] [facility] [phone](Phone) [phone](Fax)     IS THIS A 90 DAY SUPPLY : no  IS PATIENT OUT OF MEDICATION:   IF NOT; HOW MUCH IS LEFT:   LAST APPOINTMENT DATE: @12 /0718  NEXT APPOINTMENT DATE:@1 /09/2017  OTHER COMMENTS:    **Let patient know to contact pharmacy at the end of the day to make sure medication is ready. **  ** Please notify patient to allow 48-72 hours to process**  **Encourage patient to contact the pharmacy for refills or they can [facility]

## 2017-02-19 NOTE — Telephone Encounter (Signed)
MEDICATION:XARELTO 20 MG TABS tablet  PHARMACY:   [id number] [phone]Phone) [phone](Fax)     IS THIS A 90 DAY SUPPLY : n  IS PATIENT OUT OF MEDICATION:   IF NOT; HOW MUCH IS LEFT:   LAST APPOINTMENT DATE: @12 /07/18 NEXT APPOINTMENT DATE:@1 /09/2017  OTHER COMMENTS:    **Let patient know to contact pharmacy at the end of the day to make sure medication is ready. **  ** Please notify patient to allow 48-72 hours to process**  **Encourage patient to contact the pharmacy for refills or they can [facility]

## 2017-02-20 ENCOUNTER — Other Ambulatory Visit: Payer: Self-pay

## 2017-02-20 MED ORDER — RIVAROXABAN 20 MG PO TABS: ORAL_TABLET | ORAL | 5 refills | Status: DC

## 2017-02-20 NOTE — Telephone Encounter (Signed)
Prescription sent to pharmacy as requested.

## 2017-03-10 ENCOUNTER — Encounter: Payer: Self-pay | Admitting: Family Medicine

## 2017-03-10 ENCOUNTER — Ambulatory Visit (INDEPENDENT_AMBULATORY_CARE_PROVIDER_SITE_OTHER): Payer: BLUE CROSS/BLUE SHIELD | Admitting: Family Medicine

## 2017-03-10 VITALS — BP 132/80 | HR 69 | Temp 98.2°F | Ht 68.0 in | Wt 231.0 lb

## 2017-03-10 DIAGNOSIS — F324 Major depressive disorder, single episode, in partial remission: Secondary | ICD-10-CM | POA: Diagnosis not present

## 2017-03-10 DIAGNOSIS — R413 Other amnesia: Secondary | ICD-10-CM

## 2017-03-10 LAB — COMPREHENSIVE METABOLIC PANEL
ALT: 24 U/L (ref 0–53)
AST: 17 U/L (ref 0–37)
Albumin: 4.4 g/dL (ref 3.5–5.2)
Alkaline Phosphatase: 56 U/L (ref 39–117)
BUN: 16 mg/dL (ref 6–23)
CHLORIDE: 100 meq/L (ref 96–112)
CO2: 30 mEq/L (ref 19–32)
Calcium: 9.7 mg/dL (ref 8.4–10.5)
Creatinine, Ser: 0.95 mg/dL (ref 0.40–1.50)
GFR: 89.51 mL/min (ref >60.00)
GLUCOSE: 102 mg/dL — AB (ref 70–99)
POTASSIUM: 4.2 meq/L (ref 3.5–5.1)
Sodium: 138 mEq/L (ref 135–145)
Total Bilirubin: 0.9 mg/dL (ref 0.2–1.2)
Total Protein: 7.2 g/dL (ref 6.0–8.3)

## 2017-03-10 LAB — CBC
HCT: 48.4 % (ref 39.0–52.0)
Hemoglobin: 16.3 g/dL (ref 13.0–17.0)
MCHC: 33.7 g/dL (ref 30.0–36.0)
MCV: 91.4 fl (ref 78.0–100.0)
PLATELETS: 216 10*3/uL (ref 150.0–400.0)
RBC: 5.29 Mil/uL (ref 4.22–5.81)
RDW: 14.9 % (ref 11.5–15.5)
WBC: 6.4 10*3/uL (ref 4.0–10.5)

## 2017-03-10 LAB — VITAMIN B12: VITAMIN B 12: 368 pg/mL (ref 211–911)

## 2017-03-10 LAB — TSH: TSH: 1.35 u[IU]/mL (ref 0.35–4.50)

## 2017-03-10 LAB — VITAMIN D 25 HYDROXY (VIT D DEFICIENCY, FRACTURES): VITD: 28.31 ng/mL — AB (ref 30.00–100.00)

## 2017-03-10 MED ORDER — RIVAROXABAN 20 MG PO TABS: ORAL_TABLET | ORAL | 3 refills | Status: DC

## 2017-03-10 NOTE — Progress Notes (Signed)
Subjective:  [name] is a 52 y.o. year old very pleasant male patient who presents for/with See problem oriented charting ROS- no chest pain or shortness of breath. No SI. No fever or chills.    Past Medical History-  Patient Active Problem List   Diagnosis Date Noted  . Thyroid nodule 02/01/2014    Priority: High  . Pulmonary embolism ? due to testosterone therapy 01/31/2014    Priority: High  . Ulcerative colitis ([place]) 03/04/1992    Priority: High  . History of DVT (deep vein thrombosis) 02/10/2014    Priority: Medium  . Depression, major, single episode, in partial remission ([place]) 01/15/2007    Priority: Medium  . Hyperlipemia 01/09/2007    Priority: Medium  . Mandible open fracture ([place]) 05/09/2012    Priority: Low    Class: Acute  . Open fracture facial bones ([place]) 05/09/2012    Priority: Low    Class: Acute  . 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 Hypogonadism in male 01/15/2007    Priority: Low  . Memory difficulties 03/10/2017  . Obstructive sleep apnea 07/03/2016  . Hypercoagulable state ([place]) 04/04/2015  . Pulmonary embolism ([place]) 04/04/2015  . DVT (deep venous thrombosis) ([place]) 04/04/2015  . 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 diclofenac sodium (VOLTAREN) 1 % GEL Apply 2 g topically 4 (four) times daily. 100 g 1  . escitalopram (LEXAPRO) 5 MG tablet Take 1 tablet (5 mg total) by mouth daily. 30 tablet 5  . rivaroxaban (XARELTO) 20 MG TABS tablet TAKE 1 TABLET BY MOUTH EVERY DAY WITH SUPPER 90 tablet 3  . testosterone (ANDROGEL) 50 MG/5GM (1%) GEL APPLY 2 TUBES TO SKIN EVERY DAY 300 g 1   No current facility-administered medications for this visit.     Objective: BP 132/80 (BP Location: Left Arm, Patient Position: Sitting, Cuff Size: Large)   Pulse 69   Temp 98.2 F (36.8 C) (Oral)   Ht 5' 8"  (1.727 m)   Wt  231 lb (104.8 kg)   SpO2 96%   BMI 35.12 kg/m  Gen: NAD, resting comfortably  Assessment/Plan:  Other issues 1. At last visit- Pain both thumbs- advised spica bilaterally. Also voltaren for right middle finger pain (low risk as topical)- patient does not complain of this pain today 2. BP controlled today BP Readings from Last 3 Encounters:  03/10/17 132/80  02/07/17 (!) 146/86  02/07/17 (!) 136/96   Depression, major, single episode, in partial remission ([place]) S: last visit phq9 of 11 and GAD7 of 5 on zoloft 81m. Was causing libido issues even at low dose. We changed to lexapro 529mand discussed 1 month follow up. He was having testosterone increased so thought possibly symptoms related to low testosterone  PHQ9 of 4 today, gad7 of 3. A/P: Patient seems to say he has not improved but on further questioning seems to be talking about memory not being better- see different section. Will continue lexapro 39m64mhough unofrtunately having erectile issues on this- did not request to try alternate. He has told me he does not like wellbutrin although that may have less erectile impact.   Memory difficulties S: Patient's main complaint today is memory and concentration issues. He [place] He recounts having to take some medicine to protect  him from potential nerve damaging chemicals and he is concerned that could have a link. Does not trust the [place] on these issues.   He states he has to write himself a lot of reminders. Today states he can remember things from long ago but cant remember things from today- last visit I recall him stating he cannot remember why he didn't do well with old antidepressants but couldn't remember why and pointed to that logn term memory loss as an issue. He denies history of ADD or severe issues as a child  A/P: MMSE 30/30. I am not able to pick up on the memory difficulties patient expresses concern  of. It seems he wants to link memory issues to the [place] war and he talks a lot about how agent orange caused major issues and it only came out years later and he knows several people on disability.   I told him I could not clearly connect his memory issues to gulf war and that I honeslty was not able to detect them thoroughly on exam. He wants something to link together his underactive testes, memory loss, ulcerative colitis, history DVT- I told him I cannot clearly link these. Very extended time of counseling provided. Will need to do neuro exam next visit- advised 3-4 month follow up. Can repeat MMSE  Will do reversible cause of dementia workup as below. Hold off on MRI for now though he is interested in that- likely would hold off unless we see decreasing MMSE.    Future Appointments  Date Time Provider [place]  03/24/2017 10:45 AM [facility] None  05/01/2017  4:00 PM Baird Lyons D, MD [facility] None  06/09/2017 11:15 AM Marin Olp, MD [facility] PEC  08/06/2017  3:45 PM [facility] None  08/11/2017 10:00 AM Elayne Snare, MD [facility] None  3-4 month follow up advised  Meds ordered this encounter  Medications  . rivaroxaban (XARELTO) 20 MG TABS tablet    Sig: TAKE 1 TABLET BY MOUTH EVERY DAY WITH SUPPER    Dispense:  90 tablet    Refill:  3   The duration of face-to-face time during this visit was greater than 25 minutes. Greater than 50% of this time was spent in counseling, explanation of diagnosis, planning of further management, and/or coordination of care including discussing patient concerns about memory and about something that links his medical problems together- discussed did not clearly find this..   Return precautions advised.  Garret Reddish, MD

## 2017-03-10 NOTE — Progress Notes (Signed)
Medication Samples have been provided to the patient.  Drug name: Xarelto       Strength: 81m       Qty: 7 tablets  LOT: [id number] Exp.Date: 03-21  Dosing instructions: Take one (1) tablet by mouth daily  The patient has been instructed regarding the correct time, dose, and frequency of taking this medication, including desired effects and most common side effects.   JLonia MadSouthern Hizer 11:35 AM 03/10/2017

## 2017-03-10 NOTE — Assessment & Plan Note (Signed)
S: last visit phq9 of 11 and GAD7 of 5 on zoloft 52m. Was causing libido issues even at low dose. We changed to lexapro 534mand discussed 1 month follow up. He was having testosterone increased so thought possibly symptoms related to low testosterone  PHQ9 of 4 today, gad7 of 3. A/P: Patient seems to say he has not improved but on further questioning seems to be talking about memory not being better- see different section. Will continue lexapro 54m13mhough unofrtunately having erectile issues on this- did not request to try alternate. He has told me he does not like wellbutrin although that may have less erectile impact.

## 2017-03-10 NOTE — Patient Instructions (Addendum)
Please stop by lab before you go  Memory difficulties - Plan: Comprehensive metabolic panel, CBC, HIV antibody, RPR, Vitamin B12, TSH, VITAMIN D 25 Hydroxy   Lets check in 3-4 months from now to see how you are doing. No changes today

## 2017-03-10 NOTE — Assessment & Plan Note (Signed)
S: Patient's main complaint today is memory and concentration issues. He states he has had issues since returning from [place] but worse in recent years. He recounts having to take some medicine to protect him from potential nerve damaging chemicals and he is concerned that could have a link. Does not trust the [place] on these issues.   He states he has to write himself a lot of reminders. Today states he can remember things from long ago but cant remember things from today- last visit I recall him stating he cannot remember why he didn't do well with old antidepressants but couldn't remember why and pointed to that logn term memory loss as an issue. He denies history of ADD or severe issues as a child  A/P: MMSE 30/30. I am not able to pick up on the memory difficulties patient expresses concern of. It seems he wants to link memory issues to the [place] war and he talks a lot about how agent orange caused major issues and it only came out years later and he knows several people on disability.   I told him I could not clearly connect his memory issues to gulf war and that I honeslty was not able to detect them thoroughly on exam. He wants something to link together his underactive testes, memory loss, ulcerative colitis, history DVT- I told him I cannot clearly link these. Very extended time of counseling provided. Will need to do neuro exam next visit- advised 3-4 month follow up. Can repeat MMSE  Will do reversible cause of dementia workup as below. Hold off on MRI for now though he is interested in that- likely would hold off unless we see decreasing MMSE.

## 2017-03-11 ENCOUNTER — Encounter: Payer: Self-pay | Admitting: Family Medicine

## 2017-03-11 LAB — HIV ANTIBODY (ROUTINE TESTING W REFLEX): HIV 1&2 Ab, 4th Generation: NONREACTIVE

## 2017-03-11 LAB — RPR: RPR Ser Ql: NONREACTIVE

## 2017-03-14 DIAGNOSIS — G4733 Obstructive sleep apnea (adult) (pediatric): Secondary | ICD-10-CM | POA: Diagnosis not present

## 2017-03-24 ENCOUNTER — Other Ambulatory Visit (INDEPENDENT_AMBULATORY_CARE_PROVIDER_SITE_OTHER): Payer: BLUE CROSS/BLUE SHIELD

## 2017-03-24 DIAGNOSIS — E291 Testicular hypofunction: Secondary | ICD-10-CM | POA: Diagnosis not present

## 2017-03-24 LAB — TESTOSTERONE: TESTOSTERONE: 369.47 ng/dL (ref 300.00–890.00)

## 2017-03-25 ENCOUNTER — Encounter: Payer: Self-pay | Admitting: Endocrinology

## 2017-03-28 ENCOUNTER — Encounter: Payer: Self-pay | Admitting: Endocrinology

## 2017-04-03 ENCOUNTER — Telehealth: Payer: Self-pay | Admitting: Family Medicine

## 2017-04-03 NOTE — Telephone Encounter (Signed)
The patient declined an appointment.

## 2017-04-03 NOTE — Telephone Encounter (Signed)
Per the patient he's currently taking Lexapro and he is not doing good on the medication since the change.  He's experiencing foggy vision and its also hurting his memory. The patient would like someone to contact him soon to discuss his options.

## 2017-04-03 NOTE — Telephone Encounter (Signed)
I would recommend he see psychiatry at this point. He should contact his insurance for list of covered providers.   If he is only on 50m- he can take 5 mg every other day for a week then stop.   If he has worsening symptoms- advise him he needs to be seen. Call 911 immediately if any thoughts of self harm. I am sorry the medication is not helping him. Really want him to feel better and need to take some steps to get him there as listed above.

## 2017-04-07 NOTE — Telephone Encounter (Signed)
Left message on voicemail to call office.  

## 2017-04-08 NOTE — Telephone Encounter (Signed)
Pt presented to the office was in the area and thought it would be easier than calling back. Told pt Dr. Yong Channel said he would recommend seeing psychiatry, should contact your insurance for provider coverage. Pt verbalized understanding and said he does not feel this is a psychological issue he said since stopping Zoloft and being on Lexapro 5 mg he is having trouble concentrating, feels like his brain is in a fog and having trouble remembering. Pt said symptoms have exacerbated since on Lexapro. Asked pt if he has ideas of self harm? Pt said no.  Told pt can wean off Lexapro take 5 mg every other day x 1 week and lets get you in to see Dr. Yong Channel this week. Pt verbalized understanding and agreed. Appointment scheduled for Thursday at 4:00 pm with Dr. Yong Channel. Pt verbalized understanding.

## 2017-04-10 ENCOUNTER — Ambulatory Visit (INDEPENDENT_AMBULATORY_CARE_PROVIDER_SITE_OTHER): Payer: BLUE CROSS/BLUE SHIELD | Admitting: Family Medicine

## 2017-04-10 ENCOUNTER — Encounter: Payer: Self-pay | Admitting: Family Medicine

## 2017-04-10 VITALS — BP 112/80 | HR 69 | Temp 98.6°F | Ht 68.0 in | Wt 225.5 lb

## 2017-04-10 DIAGNOSIS — R413 Other amnesia: Secondary | ICD-10-CM

## 2017-04-10 DIAGNOSIS — F324 Major depressive disorder, single episode, in partial remission: Secondary | ICD-10-CM | POA: Diagnosis not present

## 2017-04-10 MED ORDER — SERTRALINE HCL 25 MG PO TABS: 25.0000 mg | ORAL_TABLET | Freq: Every day | ORAL | 3 refills | Status: DC

## 2017-04-10 NOTE — Progress Notes (Signed)
Subjective:  [name] is a 52 y.o. year old very pleasant male patient who presents for/with See problem oriented charting ROS- no fever or chills. Continued issues with memory and focus. No chest pain or shrotness of breath.    Past Medical History-  Patient Active Problem List   Diagnosis Date Noted  . Memory difficulties 03/10/2017    Priority: High  . Thyroid nodule 02/01/2014    Priority: High  . Pulmonary embolism ? due to testosterone therapy 01/31/2014    Priority: High  . Ulcerative colitis ([place]) 03/04/1992    Priority: High  . History of DVT (deep vein thrombosis) 02/10/2014    Priority: Medium  . Depression, major, single episode, in partial remission ([place]) 01/15/2007    Priority: Medium  . Hyperlipemia 01/09/2007    Priority: Medium  . Mandible open fracture ([place]) 05/09/2012    Priority: Low    Class: Acute  . Open fracture facial bones ([place]) 05/09/2012    Priority: Low    Class: Acute  . 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 Hypogonadism in male 01/15/2007    Priority: Low  . Obstructive sleep apnea 07/03/2016  . Hypercoagulable state ([place]) 04/04/2015  . Pulmonary embolism ([place]) 04/04/2015  . DVT (deep venous thrombosis) ([place]) 04/04/2015  . 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 diclofenac sodium (VOLTAREN) 1 % GEL Apply 2 g topically 4 (four) times daily. 100 g 1  . rivaroxaban (XARELTO) 20 MG TABS tablet TAKE 1 TABLET BY MOUTH EVERY DAY WITH SUPPER 90 tablet 3  . testosterone (ANDROGEL) 50 MG/5GM (1%) GEL APPLY 2 TUBES TO SKIN EVERY DAY (Patient taking differently: APPLY 1.5 TUBES TO SKIN EVERY DAY) 300 g 1  . sertraline (ZOLOFT) 25 MG tablet Take 1 tablet (25 mg total) by mouth daily. 30 tablet 3   No current facility-administered medications for this visit.     Objective: BP 112/80 (BP Location: Left Arm,  Patient Position: Sitting, Cuff Size: Large)   Pulse 69   Temp 98.6 F (37 C) (Oral)   Ht 5' 8"  (1.727 m)   Wt 225 lb 8 oz (102.3 kg)   SpO2 97%   BMI 34.29 kg/m  Gen: NAD, resting comfortably  Assessment/Plan:  Other issues- now taking b12 and vitamin D3 in addition to his multivitamin   Depression, major, single episode, in partial remission ([facility]) S: Patient on zoloft 12m was having libido issues. We changed to lexapro 540mand followed up 1 month later at which point his phq9 was 4 and gad7 was 3. Also had erectile issues on 22m36mexapro.   Other options 1. wellbutrin - doesn't like how he feels on this (less erectile issues though) 2. Celexa-drowsy 3. paxil- doesn't want to try because of what he has heard  Today, GAD7 of 4 and PHQ9 of 4, he states main issue is memory- see below. He feels memory has been far worse in last 2-3 weeks while on lexapro (has been on for 2 months). He thinks he sleeps better on it, but feels groggy/low energy and as a result memory and focus seems worse. We discussed comments on phone of blurry vision and he denies. He did not take lexapro 22mg19msterday as trying to wean as instructed.   He wants to go back to zoloft- wonders if libido issues are more testosterone and  GF issues- as he continues to have them on lexapro.  A/P: worsening memory issues on lexapro 28m per his report. we will restart zoloft at 228m debated trial of [id number] with libido issues will hold off. Would prefer 1 month follow but with limited availability we agreed to 2 month follow up but see one of my colleagues if needed before that time.  Memory difficulties S: continues to complain of memory issues. MMSE was 30/30 last visit. b12 was normal. [place] to [place] "he talks a lot about how agent orange caused major issues and it only came out years later and he knows several people on disability.   I told him I  could not clearly connect his memory issues to [place] war and that I honeslty was not able to detect them thoroughly on exam. He wants something to link together his underactive testes, memory loss, ulcerative colitis, history DVT- I told him I cannot clearly link these. " A/P: with continued memory issues- we will proceed with MRI brain. Doubt stroke or mass but will rule out with this exam. I told patient we may not come to a clear reason for memory issues- he thinks something is going to link low T, memory, fatigue- and honestly seems to focus most on potential gulf war syndrome. On review of [employer] medical term for this is "chronic multisystem illness". My very well be posttraumatic stress related. Recommendations per [employer] are "serotonin reuptake inhibitors, cognitive behavioral therapy, and mindfulness-based stress therapy ". We have discussed psychiatry referral but patient is resistant to this thinking may affect his career.  Considering abilify- gave him name to consider  Future Appointments  Date Time Provider DepMorehouse/28/2019  4:00 PM YouBaird Lyons MD [facility] None  06/09/2017 11:15 AM HunMarin OlpD [facility]  08/06/2017  3:45 PM [facility] None  08/11/2017 10:00 AM KumElayne SnareD [facility] None   Already have 2 month follow up- he will keep this  Lab/Order associations: Depression, major, single episode, in partial remission (HCCKennedyMemory difficulties - Plan: MR Brain W Wo Contrast  Memory loss - Plan: MR Brain W Wo Contrast  Meds ordered this encounter  Medications  . sertraline (ZOLOFT) 25 MG tablet    Sig: Take 1 tablet (25 mg total) by mouth daily.    Dispense:  30 tablet    Refill:  3   Time Stamp The duration of face-to-face time during this visit was greater than 25 minutes. Greater than 50% of this time was spent in counseling, explanation of diagnosis, planning of further management, and/or coordination of care  including discussion of frustrations of memory issues potential causes, how this is affecting his life and job.    Return precautions advised.  SteGarret ReddishD

## 2017-04-10 NOTE — Patient Instructions (Signed)
Restart zoloft 45m. Please let me know if you have worsening depressive symptoms. Let uKoreaknow immediately and call 911 if you have thoughts of self harm.   Just since we have had such trouble finding the right medicine for you- we could also consider referral to psychiatry for their help with antidepressant  We will call you within a week or two about your referral for MRI brain. If you do not hear within 3 weeks, give uKoreaa call.

## 2017-04-10 NOTE — Assessment & Plan Note (Signed)
S: continues to complain of memory issues. MMSE was 30/30 last visit. b12 was normal. Patient continues to want to link issues to when he was in [place].   From last visit "he talks a lot about how agent orange caused major issues and it only came out years later and he knows several people on disability.   I told him I could not clearly connect his memory issues to gulf war and that I honeslty was not able to detect them thoroughly on exam. He wants something to link together his underactive testes, memory loss, ulcerative colitis, history DVT- I told him I cannot clearly link these. " A/P: with continued memory issues- we will proceed with MRI brain. Doubt stroke or mass but will rule out with this exam. I told patient we may not come to a clear reason for memory issues- he thinks something is going to link low T, memory, fatigue- and honestly seems to focus most on potential [place] war syndrome. On review of uptodate medical term for this is "chronic multisystem illness". My very well be posttraumatic stress related. Recommendations per [employer]ate are "serotonin reuptake inhibitors, cognitive behavioral therapy, and mindfulness-based stress therapy ". We have discussed psychiatry referral but patient is resistant to this thinking may affect his career.

## 2017-04-10 NOTE — Assessment & Plan Note (Signed)
S: Patient on zoloft 348m was having libido issues. We changed to lexapro 52mand followed up 1 month later at which point his phq9 was 4 and gad7 was 3. Also had erectile issues on 48m20mexapro.   Other options 1. wellbutrin - doesn't like how he feels on this (less erectile issues though) 2. Celexa-drowsy 3. paxil- doesn't want to try because of what he has heard  Today, GAD7 of 4 and PHQ9 of 4, he states main issue is memory- see below. He feels memory has been far worse in last 2-3 weeks while on lexapro (has been on for 2 months). He thinks he sleeps better on it, but feels groggy/low energy and as a result memory and focus seems worse. We discussed comments on phone of blurry vision and he denies. He did not take lexapro 48mg29msterday as trying to wean as instructed.   He wants to go back to zoloft- wonders if libido issues are more testosterone and GF issues- as he continues to have them on lexapro.  A/P: worsening memory issues on lexapro 48mg 77m his report. we will restart zoloft at 248mg-39mated trial of 50mg b29mith libido issues will hold off. Would prefer 1 month follow but with limited availability we agreed to 2 month follow up but see one of my colleagues if needed before that time.

## 2017-04-14 ENCOUNTER — Other Ambulatory Visit: Payer: Self-pay | Admitting: Endocrinology

## 2017-04-14 DIAGNOSIS — G4733 Obstructive sleep apnea (adult) (pediatric): Secondary | ICD-10-CM | POA: Diagnosis not present

## 2017-04-18 NOTE — Telephone Encounter (Signed)
Pt needs refill sent over to the pharmacy  He stated that the pharmacy was going to be reaching out to our office to get refills sent in for pt last week   Pt is almost completely out of this.    testosterone (ANDROGEL) 50 MG/5GM (1%) GEL    [id number] [place] [place] - [address]

## 2017-04-21 ENCOUNTER — Other Ambulatory Visit: Payer: Self-pay | Admitting: Endocrinology

## 2017-04-22 ENCOUNTER — Telehealth: Payer: Self-pay | Admitting: Endocrinology

## 2017-04-22 NOTE — Telephone Encounter (Signed)
Called pts insurance to initiate PA for Testosterone. Awaiting form to faxed over from insurance to move forward with the PA. Called pt to let him know because he did pick up a paper script on 04/21/17, and was angry because his pharmacy was not receiving the script sent by Dr. Dwyane Dee earlier. Pt is irritated with the process but I did explain to him we have no control or idea on when the insurance will change what medication it covers and we have to send in what they say and wait their time period. Pt states he understands and I did inform him I will fax this back at urgent to see if that helps

## 2017-04-22 NOTE — Telephone Encounter (Signed)
Patient is out of his testosterone medication. Please call him back.

## 2017-04-23 NOTE — Telephone Encounter (Signed)
Faxed request today

## 2017-04-29 ENCOUNTER — Telehealth: Payer: Self-pay | Admitting: Endocrinology

## 2017-04-29 ENCOUNTER — Other Ambulatory Visit: Payer: Self-pay | Admitting: Endocrinology

## 2017-04-29 DIAGNOSIS — E291 Testicular hypofunction: Secondary | ICD-10-CM

## 2017-04-29 NOTE — Telephone Encounter (Signed)
Please put labs in system for pt to have done in the AM

## 2017-04-29 NOTE — Telephone Encounter (Signed)
Pt is needing testosterone lab done by 10 am tomorrow per insurance so he can get his medication approved    Please advise

## 2017-04-29 NOTE — Telephone Encounter (Signed)
He has to have a second morning (before 10am) reading done or insurance will not pay. He at this time does not. For that reason he has to have his labs done again per insurance to get his testosterone approved

## 2017-04-29 NOTE — Telephone Encounter (Signed)
He just had his labs done last month and not clear why insurance wants another test.  If they want to see documentation of a low level there is one available from 2018.  Please clarify

## 2017-04-29 NOTE — Telephone Encounter (Signed)
Usually insurance needs 2 documented low levels which are available in the chart from 2018 and 2017.   Since he is already on treatment his level will not be low if you check it now unless he stops the medication for 5 days

## 2017-04-30 ENCOUNTER — Encounter: Payer: Self-pay | Admitting: Internal Medicine

## 2017-04-30 ENCOUNTER — Other Ambulatory Visit (INDEPENDENT_AMBULATORY_CARE_PROVIDER_SITE_OTHER): Payer: BLUE CROSS/BLUE SHIELD

## 2017-04-30 DIAGNOSIS — E291 Testicular hypofunction: Secondary | ICD-10-CM | POA: Diagnosis not present

## 2017-04-30 LAB — TESTOSTERONE: Testosterone: 125.64 ng/dL — ABNORMAL LOW (ref 300.00–890.00)

## 2017-04-30 NOTE — Telephone Encounter (Signed)
Went over with Dr. Dwyane Dee this morning that the insurance did not count those levels per the time of day they were drawn.

## 2017-05-01 ENCOUNTER — Encounter: Payer: Self-pay | Admitting: Internal Medicine

## 2017-05-01 ENCOUNTER — Encounter: Payer: Self-pay | Admitting: Endocrinology

## 2017-05-01 ENCOUNTER — Ambulatory Visit (INDEPENDENT_AMBULATORY_CARE_PROVIDER_SITE_OTHER): Payer: BLUE CROSS/BLUE SHIELD | Admitting: Internal Medicine

## 2017-05-01 VITALS — BP 116/78 | HR 58 | Ht 68.0 in | Wt 228.4 lb

## 2017-05-01 DIAGNOSIS — G4733 Obstructive sleep apnea (adult) (pediatric): Secondary | ICD-10-CM

## 2017-05-01 NOTE — Assessment & Plan Note (Signed)
He has benefited from CPAP with improved sleep quality.  No concerns or changes seem appropriate at this time. Plan-he needs new supplies.  Otherwise continue auto 5-20

## 2017-05-01 NOTE — Progress Notes (Signed)
HPI male never smoker followed for OSA, complicated by history of facial fracture/trauma, history DVT/PE/ Xarelto, ulcerative colitis Unattended Home Sleep Test 07/17/16-AHI 40.7/hour, desaturation to 81%, body weight 221 pounds  ------------------------------------------------------------------------------- 10/31/16- 52 year old male never smoker followed for OSA, complicated by history of facial fracture/trauma, history DVT/PE/ Xarelto, ulcerative colitis Unattended Home Sleep Test 07/17/16-AHI 40.7/hour, desaturation to 81%, body weight 221 pounds CPAP auto 5-20/[facility] Pt feels that he sleeping better at night, avg 7 hours each night. Pt needs CPAP supplies and new mask.  Daytime sleepiness is not as much better as he hoped but he does feel he benefits with CPAP. Had tooth abscess and was fitted living with his CPAP mask to make himself more comfortable, causing leaks and interrupted use. He had previously tried and disliked an oral appliance. Download 87% compliance averaging 7 hours of use per night, AHI 2.0/hour.  05/01/17-52 year old male never smoker followed for OSA, complicated by history of facial fracture/trauma, history DVT/PE/ Xarelto, ulcerative colitis  CPAP auto 5-20/[facility] ------OSA: DME: [facility]. Pt wears CPAP and DL attached. Will need order for new supplies.  Download 100% compliance, AHI 0.9/hour.  He reports daytime sleepiness is better, waking at night less, not snoring.  He may not have quite as much energy as he had hoped but realizes that we have done what could be done to correct his sleep apnea.  He understands to be sure he gets enough sleep.  ROS-see HPI   + = positive Constitutional:    weight loss, night sweats, fevers, chills, +fatigue, lassitude. HEENT:    headaches, difficulty swallowing, tooth/dental problems, sore throat,       sneezing, itching, ear ache, nasal congestion, post nasal drip, snoring CV:    chest pain, orthopnea, PND, swelling in lower  extremities, anasarca,                                                    dizziness, palpitations Resp:   shortness of breath with exertion or at rest.                productive cough,   non-productive cough, coughing up of blood.              change in color of mucus.  wheezing.   Skin:    rash or lesions. GI:  No-   heartburn, indigestion, abdominal pain, nausea, vomiting, diarrhea,                 change in bowel habits, loss of appetite GU: dysuria, change in color of urine, no urgency or frequency.   flank pain. MS:   joint pain, stiffness, decreased range of motion, back pain. Neuro-     nothing unusual Psych:  change in mood or affect.  depression or anxiety.   memory loss.  OBJ- Physical Exam General- Alert, Oriented, Affect-appropriate, Distress- none acute.  + Overweight Skin- rash-none, + tattoos Lymphadenopathy- none Head- atraumatic            Eyes- Gross vision intact, PERRLA, conjunctivae and secretions clear            Ears- Hearing, canals-normal            Nose- Clear, Septal dev+, No- mucus, polyps, erosion, perforation             Throat- Mallampati III , mucosa  clear , drainage- none, tonsils- atrophic Neck- flexible , trachea midline, no stridor , thyroid nl, carotid no bruit Chest - symmetrical excursion , unlabored           Heart/CV- RRR , no murmur , no gallop  , no rub, nl s1 s2                           - JVD- none , edema- none, stasis changes- none, varices- none           Lung- clear to P&A, wheeze- none, cough- none , dullness-none, rub- none           Chest wall-  Abd-  Br/ Gen/ Rectal- Not done, not indicated Extrem- cyanosis- none, clubbing, none, atrophy- none, strength- nl Neuro- grossly intact to observation

## 2017-05-01 NOTE — Patient Instructions (Signed)
Order DME [facility]- please replace supplies, hose, mask. We can continue CPAP auto 5-20, mask of choice, humidifier supplies, [place]  Please call if we can help

## 2017-05-02 ENCOUNTER — Telehealth: Payer: Self-pay

## 2017-05-02 NOTE — Telephone Encounter (Signed)
Sent My-Chart message

## 2017-05-05 DIAGNOSIS — M722 Plantar fascial fibromatosis: Secondary | ICD-10-CM | POA: Diagnosis not present

## 2017-05-05 DIAGNOSIS — M19079 Primary osteoarthritis, unspecified ankle and foot: Secondary | ICD-10-CM | POA: Diagnosis not present

## 2017-05-05 DIAGNOSIS — G5760 Lesion of plantar nerve, unspecified lower limb: Secondary | ICD-10-CM | POA: Diagnosis not present

## 2017-05-05 DIAGNOSIS — M7732 Calcaneal spur, left foot: Secondary | ICD-10-CM | POA: Diagnosis not present

## 2017-05-05 DIAGNOSIS — M7731 Calcaneal spur, right foot: Secondary | ICD-10-CM | POA: Diagnosis not present

## 2017-05-05 DIAGNOSIS — M71571 Other bursitis, not elsewhere classified, right ankle and foot: Secondary | ICD-10-CM | POA: Diagnosis not present

## 2017-05-05 DIAGNOSIS — M71572 Other bursitis, not elsewhere classified, left ankle and foot: Secondary | ICD-10-CM | POA: Diagnosis not present

## 2017-05-12 DIAGNOSIS — G4733 Obstructive sleep apnea (adult) (pediatric): Secondary | ICD-10-CM | POA: Diagnosis not present

## 2017-05-15 NOTE — Telephone Encounter (Signed)
[facility]. They are able to run the claim, they will order the Androgel and contact pt.

## 2017-05-24 ENCOUNTER — Encounter: Payer: Self-pay | Admitting: Family Medicine

## 2017-06-09 ENCOUNTER — Ambulatory Visit (INDEPENDENT_AMBULATORY_CARE_PROVIDER_SITE_OTHER): Payer: BLUE CROSS/BLUE SHIELD | Admitting: Family Medicine

## 2017-06-09 ENCOUNTER — Encounter: Payer: Self-pay | Admitting: Family Medicine

## 2017-06-09 VITALS — BP 136/88 | HR 75 | Temp 98.3°F | Ht 68.0 in | Wt 225.0 lb

## 2017-06-09 DIAGNOSIS — R413 Other amnesia: Secondary | ICD-10-CM

## 2017-06-09 DIAGNOSIS — E291 Testicular hypofunction: Secondary | ICD-10-CM

## 2017-06-09 DIAGNOSIS — F324 Major depressive disorder, single episode, in partial remission: Secondary | ICD-10-CM

## 2017-06-09 NOTE — Progress Notes (Signed)
Subjective:  [name] is a 52 y.o. year old very pleasant male patient who presents for/with See problem oriented charting ROS- memory issues still, poor focus at times. Depressed mood and anhedonia much improved.    Past Medical History-  Patient Active Problem List   Diagnosis Date Noted  . Memory difficulties 03/10/2017    Priority: High  . Thyroid nodule 02/01/2014    Priority: High  . Pulmonary embolism ? due to testosterone therapy 01/31/2014    Priority: High  . Ulcerative colitis ([place]) 03/04/1992    Priority: High  . History of DVT (deep vein thrombosis) 02/10/2014    Priority: Medium  . Depression, major, single episode, in partial remission ([place]) 01/15/2007    Priority: Medium  . Hyperlipemia 01/09/2007    Priority: Medium  . Mandible open fracture ([place]) 05/09/2012    Priority: Low    Class: Acute  . Open fracture facial bones ([place]) 05/09/2012    Priority: Low    Class: Acute  . 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 Hypogonadism in male 01/15/2007    Priority: Low  . Obstructive sleep apnea 07/03/2016  . Hypercoagulable state ([place]) 04/04/2015  . Pulmonary embolism ([place]) 04/04/2015  . DVT (deep venous thrombosis) ([place]) 04/04/2015  . 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 diclofenac sodium (VOLTAREN) 1 % GEL Apply 2 g topically 4 (four) times daily. 100 g 1  . rivaroxaban (XARELTO) 20 MG TABS tablet TAKE 1 TABLET BY MOUTH EVERY DAY WITH SUPPER 90 tablet 3  . sertraline (ZOLOFT) 25 MG tablet Take 1 tablet (25 mg total) by mouth daily. 30 tablet 3  . testosterone (ANDROGEL) 50 MG/5GM (1%) GEL APPLY 2 TUBES TO SKIN EVERY DAY 300 g 0   No current facility-administered medications for this visit.     Objective: BP 136/88 (BP Location: Left Arm, Cuff Size: Large)   Pulse 75   Temp 98.3 F (36.8 C) (Oral)   Ht 5' 8"   (1.727 m)   Wt 225 lb (102.1 kg)   SpO2 95%   BMI 34.21 kg/m  Gen: NAD, resting comfortably CV: RRR no murmurs rubs or gallops Lungs: CTAB no crackles, wheeze, rhonchi Abdomen: soft/obese Ext: no edema Skin: warm, dry Neuro: sometimes struggles to find words, normal gait  Assessment/Plan:  Depression, major, single episode, in partial remission (HCC) S: PHQ9 of 0 and GAD7 of 1. We started at 6m zoloft last visit. He has found this very helpful. He feels he finally has come out of the woods from the lexapro and month without testosterone where he felt very poorly A/P: continue current medicines with 3-6 month follow up  Memory difficulties S: memory issues reported still. MMSE has been 30/30. Normal b12. MRI brain ordered last visit ordered- with plan to rule out stroke or mass.   Had a constellation of symptoms including low testosterone, memory issues, fatigue- he has been concerned about link to [place] war. From my research this is sometimes referred to as  chronic multisystem illness but antidepressants and CBT/counseling are main treatment so he is on right therapy.   He states still some focus issues. Memory still not great. Writing a lot of notes and that seems to help. Fatigue in general is better.  A/P: MRI has not been completed- we gave him # to call to get this completed. We  will get this to be on safe side. He is managing reasonably well and after MRI would not opt for further workup unless change in symptoms noted.   Hypogonadism in male S: patient went month without testosterone due to prior auth issues. He is concerned this is related to the prior endocrine office he was seen at and not related to prior auth issues. He asks for referral to new provider A/P: referred to [facility] endocrine. Please see notes in epic from Dr. Dwyane Dee as well as Dr. Irene Limbo- I think this is best cared for by specialist with history of DVT/PE and some concern of links to testosterone therapy.     Consider Abilify if needed as next step  But luckily with improvement may not need  Future Appointments  Date Time Provider [place]  08/06/2017  3:45 PM [facility] None  08/11/2017 10:00 AM Elayne Snare, MD [facility] None  11/10/2017 10:45 AM Marin Olp, MD [facility]  05/28/2018  3:00 PM Deneise Lever, MD [facility] None   3-6 months advised  Lab/Order associations: Hypogonadism in male - Plan: Ambulatory referral to Endocrinology  Return precautions advised.  Garret Reddish, MD

## 2017-06-09 NOTE — Patient Instructions (Addendum)
Please call [facility] at [phone] to schedule your MRI. It is already order and in process so you just have to call at your convenience  3-6 month follow up  We will call you within a week or two about your referral to [facility] endocrine. If you do not hear within 3 weeks, give [place] a call.

## 2017-06-09 NOTE — Assessment & Plan Note (Signed)
S: PHQ9 of 0 and GAD7 of 1. We started at 35m zoloft last visit. He has found this very helpful. He feels he finally has come out of the woods from the lexapro and month without testosterone where he felt very poorly A/P: continue current medicines with 3-6 month follow up

## 2017-06-09 NOTE — Assessment & Plan Note (Signed)
S: memory issues reported still. MMSE has been 30/30. Normal b12. MRI brain ordered last visit ordered- with plan to rule out stroke or mass.   Had a constellation of symptoms including low testosterone, memory issues, fatigue- he has been concerned about link to [place] war. From my research this is sometimes referred to as  chronic multisystem illness but antidepressants and CBT/counseling are main treatment so he is on right therapy.   He states still some focus issues. Memory still not great. Writing a lot of notes and that seems to help. Fatigue in general is better.  A/P: MRI has not been completed- we gave him # to call to get this completed. We will get this to be on safe side. He is managing reasonably well and after MRI would not opt for further workup unless change in symptoms noted.

## 2017-06-09 NOTE — Assessment & Plan Note (Signed)
S: patient went month without testosterone due to prior auth issues. He is concerned this is related to the prior endocrine office he was seen at and not related to prior auth issues. He asks for referral to new provider A/P: referred to [facility] endocrine. Please see notes in epic from Dr. Dwyane Dee as well as Dr. Irene Limbo- I think this is best cared for by specialist with history of DVT/PE and some concern of links to testosterone therapy.

## 2017-06-12 DIAGNOSIS — G4733 Obstructive sleep apnea (adult) (pediatric): Secondary | ICD-10-CM | POA: Diagnosis not present

## 2017-06-15 ENCOUNTER — Other Ambulatory Visit: Payer: Self-pay | Admitting: Endocrinology

## 2017-06-19 ENCOUNTER — Other Ambulatory Visit: Payer: Self-pay

## 2017-06-19 ENCOUNTER — Telehealth: Payer: Self-pay | Admitting: Endocrinology

## 2017-06-19 MED ORDER — TESTOSTERONE 50 MG/5GM (1%) TD GEL: TRANSDERMAL | 2 refills | Status: DC

## 2017-06-19 NOTE — Telephone Encounter (Signed)
testosterone (ANDROGEL) 50 MG/5GM (1%) GEL  Pharmacy called they do not have the patients prescription in stock and needs [place] to send this [facility]   [id number] [place], [place] - [address]

## 2017-06-19 NOTE — Telephone Encounter (Signed)
Medication was sent to pharmacy per pt request.

## 2017-06-23 ENCOUNTER — Telehealth: Payer: Self-pay | Admitting: Endocrinology

## 2017-06-23 ENCOUNTER — Other Ambulatory Visit: Payer: Self-pay | Admitting: Endocrinology

## 2017-06-23 MED ORDER — TESTOSTERONE 50 MG/5GM (1%) TD GEL: TRANSDERMAL | 2 refills | Status: DC

## 2017-06-23 NOTE — Telephone Encounter (Signed)
Will you please send this?

## 2017-06-23 NOTE — Telephone Encounter (Signed)
This has been sent, may [facility] previously

## 2017-06-23 NOTE — Telephone Encounter (Signed)
Patient stated the pharmacy have not received his testosterone  send to  [id number] [place], [place] - [address] [phone](Phone) [phone](Fax)

## 2017-08-06 ENCOUNTER — Other Ambulatory Visit: Payer: Self-pay | Admitting: Endocrinology

## 2017-08-06 ENCOUNTER — Other Ambulatory Visit (INDEPENDENT_AMBULATORY_CARE_PROVIDER_SITE_OTHER): Payer: BLUE CROSS/BLUE SHIELD

## 2017-08-06 DIAGNOSIS — E042 Nontoxic multinodular goiter: Secondary | ICD-10-CM | POA: Diagnosis not present

## 2017-08-06 DIAGNOSIS — E291 Testicular hypofunction: Secondary | ICD-10-CM | POA: Diagnosis not present

## 2017-08-06 LAB — TESTOSTERONE: Testosterone: 417.89 ng/dL (ref 300.00–890.00)

## 2017-08-06 LAB — CBC
HEMATOCRIT: 47 % (ref 39.0–52.0)
HEMOGLOBIN: 16.1 g/dL (ref 13.0–17.0)
MCHC: 34.3 g/dL (ref 30.0–36.0)
MCV: 89.7 fl (ref 78.0–100.0)
PLATELETS: 202 10*3/uL (ref 150.0–400.0)
RBC: 5.24 Mil/uL (ref 4.22–5.81)
RDW: 14.3 % (ref 11.5–15.5)
WBC: 6.6 10*3/uL (ref 4.0–10.5)

## 2017-08-06 LAB — TSH: TSH: 0.99 u[IU]/mL (ref 0.35–4.50)

## 2017-08-10 NOTE — Progress Notes (Signed)
Patient ID: [name], male   DOB: 1965-06-27, 52 y.o.   MRN: [id number]           Referring physician: Yong Channel  Reason for visit: Follow-up of low testosterone  History of Present Illness  Prior history: Hypogonadismwas diagnosed probably about 10 years ago but details are not available At the time of diagnosis he was having complaints of moodiness, decreased libido, tiredness and low motivation. He was told to have a low testosterone and initially started on AndroGel He did not feel any better with this and he thinks his levels of testosterone were not improved Subsequently was put on testosterone injections, 200 mg every 3 weeks with improvement in his symptoms and he had less moodiness, fatigue and increased libido although not completely back to normal Apparently when he had his pulmonary embolism in 11/15 he was told to stop his testosterone injections At that time his hemoglobin level was 16, previously had been about 14  Prior to his initial consultation he had complaints ofprogressive fatigue, decreased motivation, moodiness, decreased libido.   He also thinks he has gained weight  Evaluation showed is free testosterone level to be low at 6.0 LH was normal at 6.3  He was recommended to trial of clomiphene because of his history of increased hematocrit with testosterone but he subjectively did not feel better with this and he stopped this, testosterone did not appear to be improved  RECENT HISTORY:  He had persistently low levels with trial of clomiphene as well as significant fatigue and mood changes.  He has been continued on AndroGel 1% which is still covered by his insurance. He has needed adjustment of his doses periodically He has taken between 5-10 g daily in the past  He has had problems with nonspecific fatigue and decreased libido in the past also and difficult to judge his symptom control However now with taking 1-1/2 packs a day of the AndroGel 1% he  thinks he is fairly good with his energy level, has no excessive daytime somnolence and no significant problems with libido, his wife was also present in the office today He may have occasional issues with anxiety but no significant agitation lately  He has been regular with his testosterone gel application daily with his morning routine and allowing it to dry  His testosterone level is now significantly better than before at 418 Hemoglobin is excellent and not high  Previous weight history: Range 198-226   Wt Readings from Last 3 Encounters:  08/11/17 233 lb (105.7 kg)  06/09/17 225 lb (102.1 kg)  05/01/17 228 lb 6.4 oz (103.6 kg)    Prior lab results showtestosterone levels:  Lab Results  Component Value Date   TESTOSTERONE 417.89 08/06/2017   TESTOSTERONE 125.64 (L) 04/30/2017   TESTOSTERONE 369.47 03/24/2017   TESTOSTERONE 195.82 (L) 02/05/2017    Prolactin level: Normal   Lab Results  Component Value Date   LH 6.33 06/20/2015    Lab Results  Component Value Date   HGB 16.1 08/06/2017        Allergies as of 08/11/2017      Reactions   Metronidazole Other (See Comments)   REACTION: Fatigue      Medication List        Accurate as of 08/11/17 10:16 AM. Always use your most recent med list.          diclofenac sodium 1 % Gel Commonly known as:  VOLTAREN Apply 2 g topically 4 (four) times daily.  rivaroxaban 20 MG Tabs tablet Commonly known as:  XARELTO TAKE 1 TABLET BY MOUTH EVERY DAY WITH SUPPER   sertraline 25 MG tablet Commonly known as:  ZOLOFT Take 1 tablet (25 mg total) by mouth daily.   testosterone 50 MG/5GM (1%) Gel Commonly known as:  ANDROGEL APPLY CONTENTS OF 2 PACKETS EVERY DAY       Allergies:  Allergies  Allergen Reactions  . Metronidazole Other (See Comments)    REACTION: Fatigue    Past Medical History:  Diagnosis Date  . Depression   . Hypogonadism male   . Ulcerative colitis, unspecified     Past Surgical  History:  Procedure Laterality Date  . MANDIBULAR HARDWARE REMOVAL Bilateral 07/10/2012   Procedure: BILATERAL MANDIBULAR HARDWARE REMOVAL;  Surgeon: Jerrell Belfast, MD;  Location: [facility];  Service: ENT;  Laterality: Bilateral;  . ORIF MANDIBULAR FRACTURE Left 05/09/2012   Procedure: OPEN REDUCTION INTERNAL FIXATION (ORIF) RIGHT ANTERIOR MANDIBULAR FRACTURE; Open Reduction Internal Fixation of Midface fracture; Reduction of septal fracture; arch bars; Repair of lacerations;  Surgeon: Jerrell Belfast, MD;  Location: [facility];  Service: ENT;  Laterality: Left;  . TRACHEOSTOMY TUBE PLACEMENT N/A 05/09/2012   Procedure: TRACHEOSTOMY;  Surgeon: Jerrell Belfast, MD;  Location: [facility];  Service: ENT;  Laterality: N/A;    Family History  Problem Relation Age of Onset  . COPD Father   . Lung cancer Unknown     Social History:  reports that he has never smoked. He has never used smokeless tobacco. He reports that he drinks alcohol. He reports that he does not use drugs.  Review of Systems    He is not complaining of daytime somnolence recently  THYROID nodules:  Apparently on a CT scan he had a thyroid nodule detected in 2015 Ultrasound subsequently showed mostly cystic small nodules bilaterally with the largest being a complex cyst of about 11 mm on the left side  He is  on Xarelto and he is going to continue this indefinitely for prophylaxis  He checks his blood pressure at work and he thinks it is higher in the office since he tends to be rushing when he comes in   BP Readings from Last 3 Encounters:  08/11/17 (!) 144/90  06/09/17 136/88  05/01/17 116/78     General Examination:   BP (!) 144/90 (BP Location: Left Arm, Patient Position: Sitting, Cuff Size: Normal)   Pulse 79   Ht 5' 8"  (1.727 m)   Wt 233 lb (105.7 kg)   SpO2 99%   BMI 35.43 kg/m      Assessment/ Plan:   Hypogonadotropic hypogonadism, idiopathic  Currently on AndroGel 1% with the packets, this is his insurance  preferred medication  Today he does not report any significant fatigue, change in libido or change in moods He is very regular with the application of the morning with his  His testosterone level is now excellent at over 400  Today the patient is asking about Testopel as an option instead of applying the testosterone gel regularly Discussed how this would be done in he is interested in learning more about this He will call us if he has insurance coverage for this treatment modality and we will need to refer him to urology for this  He will come back in 6 months Also recommended that he follow his blood pressure regularly at least at work    [employer] 08/11/2017, 10:16 AM

## 2017-08-11 ENCOUNTER — Ambulatory Visit (INDEPENDENT_AMBULATORY_CARE_PROVIDER_SITE_OTHER): Payer: BLUE CROSS/BLUE SHIELD | Admitting: Endocrinology

## 2017-08-11 ENCOUNTER — Encounter: Payer: Self-pay | Admitting: Endocrinology

## 2017-08-11 VITALS — BP 144/90 | HR 79 | Ht 68.0 in | Wt 233.0 lb

## 2017-08-11 DIAGNOSIS — E291 Testicular hypofunction: Secondary | ICD-10-CM | POA: Diagnosis not present

## 2017-08-11 NOTE — Patient Instructions (Signed)
Testopel cost

## 2017-08-19 ENCOUNTER — Other Ambulatory Visit: Payer: Self-pay | Admitting: Family Medicine

## 2017-10-12 ENCOUNTER — Other Ambulatory Visit: Payer: Self-pay | Admitting: Endocrinology

## 2017-10-13 NOTE — Telephone Encounter (Signed)
Please print for signature

## 2017-10-13 NOTE — Telephone Encounter (Signed)
Please refill if appropriate

## 2017-10-20 ENCOUNTER — Other Ambulatory Visit: Payer: Self-pay | Admitting: Endocrinology

## 2017-10-22 ENCOUNTER — Other Ambulatory Visit: Payer: Self-pay | Admitting: Endocrinology

## 2017-10-23 ENCOUNTER — Telehealth: Payer: Self-pay | Admitting: Endocrinology

## 2017-10-23 NOTE — Telephone Encounter (Signed)
error 

## 2017-10-24 ENCOUNTER — Other Ambulatory Visit: Payer: Self-pay | Admitting: Endocrinology

## 2017-10-24 ENCOUNTER — Telehealth: Payer: Self-pay | Admitting: Emergency Medicine

## 2017-10-24 NOTE — Telephone Encounter (Signed)
[facility] a [facility] for his testosterone (ANDROGEL) 50 MG/5GM (1%) GEL. They have not received a prescription for it and wanted to know if one can be send over. Thanks.

## 2017-10-24 NOTE — Telephone Encounter (Signed)
[facility]- This has been resolved

## 2017-11-10 ENCOUNTER — Ambulatory Visit: Payer: BLUE CROSS/BLUE SHIELD | Admitting: Family Medicine

## 2017-11-10 DIAGNOSIS — Z0289 Encounter for other administrative examinations: Secondary | ICD-10-CM

## 2017-11-12 ENCOUNTER — Encounter: Payer: Self-pay | Admitting: Family Medicine

## 2017-12-02 DIAGNOSIS — G4733 Obstructive sleep apnea (adult) (pediatric): Secondary | ICD-10-CM | POA: Diagnosis not present

## 2017-12-05 ENCOUNTER — Ambulatory Visit (INDEPENDENT_AMBULATORY_CARE_PROVIDER_SITE_OTHER): Payer: BLUE CROSS/BLUE SHIELD | Admitting: Family Medicine

## 2017-12-05 ENCOUNTER — Encounter: Payer: Self-pay | Admitting: Family Medicine

## 2017-12-05 DIAGNOSIS — I10 Essential (primary) hypertension: Secondary | ICD-10-CM

## 2017-12-05 DIAGNOSIS — I2699 Other pulmonary embolism without acute cor pulmonale: Secondary | ICD-10-CM

## 2017-12-05 DIAGNOSIS — R413 Other amnesia: Secondary | ICD-10-CM

## 2017-12-05 DIAGNOSIS — F324 Major depressive disorder, single episode, in partial remission: Secondary | ICD-10-CM | POA: Diagnosis not present

## 2017-12-05 DIAGNOSIS — D6859 Other primary thrombophilia: Secondary | ICD-10-CM | POA: Diagnosis not present

## 2017-12-05 HISTORY — DX: Essential (primary) hypertension: I10

## 2017-12-05 NOTE — Assessment & Plan Note (Signed)
Plan to continue lifelong xarelto

## 2017-12-05 NOTE — Assessment & Plan Note (Signed)
S: controlled poorly on no rx on last 2 checks confirming diagnosis of hypertension. He would prefer to avoid medications at this point. We also opted for home monitoring to see how #s looking at home.  BP Readings from Last 3 Encounters:  12/05/17 (!) 142/92  08/11/17 (!) 144/90  06/09/17 136/88  A/P: We discussed blood pressure goal of <140/90. Continue without meds for now and work on regular exercise, dash eating plan. Weight Up 20 lbs over last 2 years. Low on exercise. I think he has some space to make improvements and follow up 2-3 months

## 2017-12-05 NOTE — Progress Notes (Signed)
Subjective:  [name] is a 52 y.o. year old very pleasant male patient who presents for/with See problem oriented charting ROS- No chest pain or shortness of breath. No headache or blurry vision.    Past Medical History-  Patient Active Problem List   Diagnosis Date Noted  . Memory difficulties 03/10/2017    Priority: High  . Hypercoagulable state ([place]) 04/04/2015    Priority: High  . Thyroid nodule 02/01/2014    Priority: High  . Pulmonary embolism ? due to testosterone therapy 01/31/2014    Priority: High  . Ulcerative colitis ([place]) 03/04/1992    Priority: High  . Obstructive sleep apnea 07/03/2016    Priority: Medium  . History of DVT (deep vein thrombosis) 02/10/2014    Priority: Medium  . Depression, major, single episode, in partial remission ([place]) 01/15/2007    Priority: Medium  . Hyperlipemia 01/09/2007    Priority: Medium  . Mandible open fracture ([place]) 05/09/2012    Priority: Low    Class: Acute  . Open fracture facial bones ([place]) 05/09/2012    Priority: Low    Class: Acute  . 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 Hypogonadism in male 01/15/2007    Priority: Low  . 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 Hypertension, essential 12/05/2017    Medications- reviewed and updated Current Outpatient Medications  Medication Sig Dispense Refill  . diclofenac sodium (VOLTAREN) 1 % GEL Apply 2 g topically 4 (four) times daily. 100 g 1  . rivaroxaban (XARELTO) 20 MG TABS tablet TAKE 1 TABLET BY MOUTH EVERY DAY WITH SUPPER 90 tablet 3  . sertraline (ZOLOFT) 25 MG tablet TAKE 1 TABLET BY MOUTH EVERY DAY 90 tablet 1  . testosterone (ANDROGEL) 50 MG/5GM (1%) GEL APPLY CONTENTS OF 2 PACKETS EVERY DAY 300 g 2   No current facility-administered medications for this visit.     Objective: BP (!) 142/92   Pulse 80   Temp 98.3 F (36.8 C) (Oral)   Ht 5' 8"  (1.727 m)   Wt 235 lb 3.2 oz (106.7 kg)   SpO2 96%    BMI 35.76 kg/m  Gen: NAD, resting comfortably CV: RRR no murmurs rubs or gallops Lungs: CTAB no crackles, wheeze, rhonchi Abdomen: soft/nontender/nondistended/normal bowel sounds. Ext: no edema Skin: warm, dry  Assessment/Plan:  Other notes: 1.testosterone-  with history dvt/PE and possible links to testosterone I have deferred treatment to endocrine. Referred to Dr. Buddy Duty last visit. Patient had seen Dr. Dwyane Dee in past.   Pulmonary embolism ? due to testosterone therapy S: compliant with xarelto and states no side effects with this. Left leg DVT 02/01/14 and also with PE and then superficial clot in left GSV but with potential to extend to distal system noted 01/06/15 so now on lifelong therapy A/P: he is open to just staying on medicatoin at this point. Never had full hypercoaguability workup because when was off xarelto did not come in for this before xarelto had to be restarted   Depression, major, single episode, in partial remission ([place]) S: patient is compliant with zoloft 61m. PHq9 of 2 and gad7 of 3. Had to put dog down in January, lost dad in may to copd/empysema, girlfriend with mass on pancreas and has no insurance.   Have considered abilify as next step if needed.  A/P: continue  Current medicaitons  Memory difficulties S:  Patient with continued reported memory issues.  He complains of focus issues, needing to take a lot of notes but that seems to help.  Feels like ginko biloba from [facility] has actually made him feel sharper. Uses same system and same location for leaving things as he is less forgetful with this.   B12. normal in past. He never followed through with MRI of brain as intended to urle out stroke or mass.   Has fatigue but remaining reasonably controlled- wonders if he has just accepted current level of fatigue.   Constellation of symptoms include low testosterone, memory issues, fatigue- in past has mentioned concern about link to [place] war. My research on this  notes chronic multisystem illness- antidepressants and CBT/counseling are main treatments.  A/P: Patient is pleased with improvement in memory on ginko biloba which he takes 2 weeks breaks from every 1-2 months. He would like to defer MRI . We sill update neuro exam if symptoms worsen  Hypercoagulable state ([place]) Plan to continue lifelong xarelto  Hypertension, essential S: controlled poorly on no rx on last 2 checks confirming diagnosis of hypertension. He would prefer to avoid medications at this point. We also opted for home monitoring to see how #s looking at home.  BP Readings from Last 3 Encounters:  12/05/17 (!) 142/92  08/11/17 (!) 144/90  06/09/17 136/88  A/P: We discussed blood pressure goal of <140/90. Continue without meds for now and work on regular exercise, dash eating plan. Weight Up 20 lbs over last 2 years. Low on exercise. I think he has some space to make improvements and follow up 2-3 months     Future Appointments  Date Time Provider [place]  02/03/2018  3:30 PM Marin Olp, MD [facility]  02/12/2018  3:45 PM [facility] None  02/16/2018 10:45 AM Elayne Snare, MD [facility] None  05/28/2018  3:00 PM Baird Lyons D, MD [facility] None  bp recheck in 2 months or so . Reasonable goal at least 5 lbs down.   Lab/Order associations: Other acute pulmonary embolism without acute cor pulmonale (HCC)  Depression, major, single episode, in partial remission ([place])  Memory difficulties  Hypercoagulable state ([place])  Hypertension, essential  Return precautions advised.  Garret Reddish, MD

## 2017-12-05 NOTE — Assessment & Plan Note (Signed)
S: compliant with xarelto and states no side effects with this. Left leg DVT 02/01/14 and also with PE and then superficial clot in left GSV but with potential to extend to distal system noted 01/06/15 so now on lifelong therapy A/P: he is open to just staying on medicatoin at this point. Never had full hypercoaguability workup because when was off xarelto did not come in for this before xarelto had to be restarted

## 2017-12-05 NOTE — Assessment & Plan Note (Signed)
S: patient is compliant with zoloft 13m. PHq9 of 2 and gad7 of 3. Had to put dog down in January, lost dad in may to copd/empysema, girlfriend with mass on pancreas and has no insurance.   Have considered abilify as next step if needed.  A/P: continue  Current medicaitons

## 2017-12-05 NOTE — Assessment & Plan Note (Signed)
S:  Patient with continued reported memory issues. He complains of focus issues, needing to take a lot of notes but that seems to help.  Feels like [facility] biloba from [facility] has actually made him feel sharper. Uses same system and same location for leaving things as he is less forgetful with this.   B12. normal in past. He never followed through with MRI of brain as intended to urle out stroke or mass.   Has fatigue but remaining reasonably controlled- wonders if he has just accepted current level of fatigue.   Constellation of symptoms include low testosterone, memory issues, fatigue- in past has mentioned concern about link to [place] war. My research on this notes chronic multisystem illness- antidepressants and CBT/counseling are main treatments.  A/P: Patient is pleased with improvement in memory on ginko biloba which he takes 2 weeks breaks from every 1-2 months. He would like to defer MRI . We sill update neuro exam if symptoms worsen

## 2017-12-05 NOTE — Patient Instructions (Addendum)
Health Maintenance Due  Topic Date Due  . INFLUENZA VACCINE -pt declines 10/02/2017   Start back on biking- target 150 minutes a week so we can try to keep you off medication  Want you to work on getting 20 lbs off that have been added on in last 2 years   2 month recheck on blood pressure to make sure going in right direction  DASH Eating Plan DASH stands for "Dietary Approaches to Stop Hypertension." The DASH eating plan is a healthy eating plan that has been shown to reduce high blood pressure (hypertension). It may also reduce your risk for type 2 diabetes, heart disease, and stroke. The DASH eating plan may also help with weight loss. What are tips for following this plan? General guidelines  Avoid eating more than 2,300 mg (milligrams) of salt (sodium) a day. If you have hypertension, you may need to reduce your sodium intake to 1,500 mg a day.  Limit alcohol intake to no more than 1 drink a day for nonpregnant women and 2 drinks a day for men. One drink equals 12 oz of beer, 5 oz of wine, or 1 oz of hard liquor.  Work with your health care provider to maintain a healthy body weight or to lose weight. Ask what an ideal weight is for you.  Get at least 30 minutes of exercise that causes your heart to beat faster (aerobic exercise) most days of the week. Activities may include walking, swimming, or biking.  Work with your health care provider or diet and nutrition specialist (dietitian) to adjust your eating plan to your individual calorie needs. Reading food labels  Check food labels for the amount of sodium per serving. Choose foods with less than 5 percent of the Daily Value of sodium. Generally, foods with less than 300 mg of sodium per serving fit into this eating plan.  To find whole grains, look for the word "whole" as the first word in the ingredient list. Shopping  Buy products labeled as "low-sodium" or "no salt added."  Buy fresh foods. Avoid canned foods and premade  or frozen meals. Cooking  Avoid adding salt when cooking. Use salt-free seasonings or herbs instead of table salt or sea salt. Check with your health care provider or pharmacist before using salt substitutes.  Do not fry foods. Cook foods using healthy methods such as baking, boiling, grilling, and broiling instead.  Cook with heart-healthy oils, such as olive, canola, soybean, or sunflower oil. Meal planning   Eat a balanced diet that includes: ? 5 or more servings of fruits and vegetables each day. At each meal, try to fill half of your plate with fruits and vegetables. ? Up to 6-8 servings of whole grains each day. ? Less than 6 oz of lean meat, poultry, or fish each day. A 3-oz serving of meat is about the same size as a deck of cards. One egg equals 1 oz. ? 2 servings of low-fat dairy each day. ? A serving of nuts, seeds, or beans 5 times each week. ? Heart-healthy fats. Healthy fats called Omega-3 fatty acids are found in foods such as flaxseeds and [place] fish, like sardines, salmon, and mackerel.  Limit how much you eat of the following: ? Canned or prepackaged foods. ? Food that is high in trans fat, such as fried foods. ? Food that is high in saturated fat, such as fatty meat. ? Sweets, desserts, sugary drinks, and other foods with added sugar. ? Full-fat dairy products.  Do not salt foods before eating.  Try to eat at least 2 vegetarian meals each week.  Eat more home-cooked food and less restaurant, buffet, and fast food.  When eating at a restaurant, ask that your food be prepared with less salt or no salt, if possible. What foods are recommended? The items listed may not be a complete list. Talk with your dietitian about what dietary choices are best for you. Grains Whole-grain or whole-wheat bread. Whole-grain or whole-wheat pasta. Brown rice. [place]. Bulgur. Whole-grain and low-sodium cereals. Pita bread. Low-fat, low-sodium crackers. Whole-wheat flour  tortillas. Vegetables Fresh or frozen vegetables (raw, steamed, roasted, or grilled). Low-sodium or reduced-sodium tomato and vegetable juice. Low-sodium or reduced-sodium tomato sauce and tomato paste. Low-sodium or reduced-sodium canned vegetables. Fruits All fresh, dried, or frozen fruit. Canned fruit in natural juice (without added sugar). Meat and other protein foods Skinless chicken or [place]. Ground chicken or [place]. Pork with fat trimmed off. Fish and seafood. Egg whites. Dried beans, peas, or lentils. Unsalted nuts, nut butters, and seeds. Unsalted canned beans. Lean cuts of beef with fat trimmed off. Low-sodium, lean deli meat. Dairy Low-fat (1%) or fat-free (skim) milk. Fat-free, low-fat, or reduced-fat cheeses. Nonfat, low-sodium ricotta or cottage cheese. Low-fat or nonfat yogurt. Low-fat, low-sodium cheese. Fats and oils Soft margarine without trans fats. Vegetable oil. Low-fat, reduced-fat, or light mayonnaise and salad dressings (reduced-sodium). Canola, safflower, olive, soybean, and sunflower oils. Avocado. Seasoning and other foods Herbs. Spices. Seasoning mixes without salt. Unsalted popcorn and pretzels. Fat-free sweets. What foods are not recommended? The items listed may not be a complete list. Talk with your dietitian about what dietary choices are best for you. Grains Baked goods made with fat, such as croissants, muffins, or some breads. Dry pasta or rice meal packs. Vegetables Creamed or fried vegetables. Vegetables in a cheese sauce. Regular canned vegetables (not low-sodium or reduced-sodium). Regular canned tomato sauce and paste (not low-sodium or reduced-sodium). Regular tomato and vegetable juice (not low-sodium or reduced-sodium). Angie Fava. Olives. Fruits Canned fruit in a light or heavy syrup. Fried fruit. Fruit in cream or butter sauce. Meat and other protein foods Fatty cuts of meat. Ribs. Fried meat. Berniece Salines. Sausage. Bologna and other processed lunch meats.  Salami. Fatback. Hotdogs. Bratwurst. Salted nuts and seeds. Canned beans with added salt. Canned or smoked fish. Whole eggs or egg yolks. Chicken or [place] with skin. Dairy Whole or 2% milk, cream, and half-and-half. Whole or full-fat cream cheese. Whole-fat or sweetened yogurt. Full-fat cheese. Nondairy creamers. Whipped toppings. Processed cheese and cheese spreads. Fats and oils Butter. Stick margarine. Lard. Shortening. Ghee. Bacon fat. Tropical oils, such as coconut, palm kernel, or palm oil. Seasoning and other foods Salted popcorn and pretzels. Onion salt, garlic salt, seasoned salt, table salt, and sea salt. Worcestershire sauce. Tartar sauce. Barbecue sauce. Teriyaki sauce. Soy sauce, including reduced-sodium. Steak sauce. Canned and packaged gravies. Fish sauce. Oyster sauce. Cocktail sauce. Horseradish that you find on the shelf. Ketchup. Mustard. Meat flavorings and tenderizers. Bouillon cubes. Hot sauce and Tabasco sauce. Premade or packaged marinades. Premade or packaged taco seasonings. Relishes. Regular salad dressings. Where to find more information:  National Heart, Lung, and [place]: https://wilson-eaton.com/  American Heart Association: www.heart.org Summary  The DASH eating plan is a healthy eating plan that has been shown to reduce high blood pressure (hypertension). It may also reduce your risk for type 2 diabetes, heart disease, and stroke.  With the DASH eating plan, you should limit salt (sodium)  intake to 2,300 mg a day. If you have hypertension, you may need to reduce your sodium intake to 1,500 mg a day.  When on the DASH eating plan, aim to eat more fresh fruits and vegetables, whole grains, lean proteins, low-fat dairy, and heart-healthy fats.  Work with your health care provider or diet and nutrition specialist (dietitian) to adjust your eating plan to your individual calorie needs. This information is not intended to replace advice given to you by your health  care provider. Make sure you discuss any questions you have with your health care provider. Document Released: 02/07/2011 Document Revised: 02/12/2016 Document Reviewed: 02/12/2016 Elsevier Interactive Patient Education  [employer].

## 2018-02-02 ENCOUNTER — Other Ambulatory Visit (INDEPENDENT_AMBULATORY_CARE_PROVIDER_SITE_OTHER): Payer: BLUE CROSS/BLUE SHIELD

## 2018-02-02 DIAGNOSIS — E291 Testicular hypofunction: Secondary | ICD-10-CM | POA: Diagnosis not present

## 2018-02-02 LAB — CBC
HCT: 50.2 % (ref 39.0–52.0)
HEMOGLOBIN: 16.9 g/dL (ref 13.0–17.0)
MCHC: 33.7 g/dL (ref 30.0–36.0)
MCV: 88.4 fl (ref 78.0–100.0)
Platelets: 215 10*3/uL (ref 150.0–400.0)
RBC: 5.68 Mil/uL (ref 4.22–5.81)
RDW: 14.5 % (ref 11.5–15.5)
WBC: 6.2 10*3/uL (ref 4.0–10.5)

## 2018-02-02 LAB — TESTOSTERONE: Testosterone: 384.63 ng/dL (ref 300.00–890.00)

## 2018-02-03 ENCOUNTER — Ambulatory Visit: Payer: BLUE CROSS/BLUE SHIELD | Admitting: Family Medicine

## 2018-02-12 ENCOUNTER — Other Ambulatory Visit: Payer: BLUE CROSS/BLUE SHIELD

## 2018-02-12 ENCOUNTER — Other Ambulatory Visit: Payer: Self-pay | Admitting: Family Medicine

## 2018-02-16 ENCOUNTER — Ambulatory Visit (INDEPENDENT_AMBULATORY_CARE_PROVIDER_SITE_OTHER): Payer: BLUE CROSS/BLUE SHIELD | Admitting: Endocrinology

## 2018-02-16 ENCOUNTER — Encounter: Payer: Self-pay | Admitting: Endocrinology

## 2018-02-16 VITALS — BP 132/80 | HR 76 | Ht 68.0 in | Wt 238.4 lb

## 2018-02-16 DIAGNOSIS — E291 Testicular hypofunction: Secondary | ICD-10-CM | POA: Diagnosis not present

## 2018-02-16 MED ORDER — TESTOSTERONE 50 MG/5GM (1%) TD GEL: TRANSDERMAL | 2 refills | Status: DC

## 2018-02-16 NOTE — Progress Notes (Signed)
Patient ID: [name], male   DOB: 05/20/1965, 52 y.o.   MRN: [id number]           Referring physician: Yong Channel  Reason for visit: Follow-up of low testosterone  History of Present Illness  Prior history: Hypogonadismwas diagnosed probably about 10 years ago but details are not available At the time of diagnosis he was having complaints of moodiness, decreased libido, tiredness and low motivation. He was told to have a low testosterone and initially started on AndroGel He did not feel any better with this and he thinks his levels of testosterone were not improved Subsequently was put on testosterone injections, 200 mg every 3 weeks with improvement in his symptoms and he had less moodiness, fatigue and increased libido although not completely back to normal Apparently when he had his pulmonary embolism in 11/15 he was told to stop his testosterone injections At that time his hemoglobin level was 16, previously had been about 14  Prior to his initial consultation he had complaints ofprogressive fatigue, decreased motivation, moodiness, decreased libido.   He also thinks he has gained weight  Evaluation showed is free testosterone level to be low at 6.0 LH was normal at 6.3  He was recommended to trial of clomiphene because of his history of increased hematocrit with testosterone but he subjectively did not feel better with this and he stopped this, testosterone did not appear to be improved  RECENT HISTORY:  He had persistently low levels with trial of clomiphene as well as significant fatigue and mood changes.  He has been continued on AndroGel 1% which is covered by his insurance. He has needed adjustment of his doses in the past He has taken between 5-10 g daily in the past  He has had symptoms of fatigue and decreased libido at baseline  More recently with taking 1-1/2 packs a day of the AndroGel 1% he does not complain of any fatigue He still has significant problems  with libido which he thinks has been there for a long time although on the previous visit was with his wife and this symptom was denied.  He has been regular with his testosterone gel application daily before getting dressed He said that if he misses a dose he will feel more tired moody for couple of days  His testosterone level is still fairly good at 385  Hemoglobin is upper normal at 16.9   Weight history:  Wt Readings from Last 3 Encounters:  02/16/18 238 lb 6.4 oz (108.1 kg)  12/05/17 235 lb 3.2 oz (106.7 kg)  08/11/17 233 lb (105.7 kg)    Prior lab results showtestosterone levels:  Lab Results  Component Value Date   TESTOSTERONE 384.63 02/02/2018   TESTOSTERONE 417.89 08/06/2017   TESTOSTERONE 125.64 (L) 04/30/2017   TESTOSTERONE 369.47 03/24/2017    Prolactin level: Normal   Lab Results  Component Value Date   LH 6.33 06/20/2015    Lab Results  Component Value Date   HGB 16.9 02/02/2018        Allergies as of 02/16/2018      Reactions   Metronidazole Other (See Comments)   REACTION: Fatigue      Medication List       Accurate as of February 16, 2018 10:36 AM. Always use your most recent med list.        diclofenac sodium 1 % Gel Commonly known as:  VOLTAREN Apply 2 g topically 4 (four) times daily.   rivaroxaban 20 MG  Tabs tablet Commonly known as:  XARELTO TAKE 1 TABLET BY MOUTH EVERY DAY WITH SUPPER   sertraline 25 MG tablet Commonly known as:  ZOLOFT TAKE 1 TABLET BY MOUTH EVERY DAY   testosterone 50 MG/5GM (1%) Gel Commonly known as:  ANDROGEL APPLY CONTENTS OF 2 PACKETS EVERY DAY       Allergies:  Allergies  Allergen Reactions  . Metronidazole Other (See Comments)    REACTION: Fatigue    Past Medical History:  Diagnosis Date  . Depression   . Hypertension, essential 12/05/2017  . Hypogonadism male   . Ulcerative colitis, unspecified     Past Surgical History:  Procedure Laterality Date  . MANDIBULAR  HARDWARE REMOVAL Bilateral 07/10/2012   Procedure: BILATERAL MANDIBULAR HARDWARE REMOVAL;  Surgeon: Jerrell Belfast, MD;  Location: [place];  Service: ENT;  Laterality: Bilateral;  . ORIF MANDIBULAR FRACTURE Left 05/09/2012   Procedure: OPEN REDUCTION INTERNAL FIXATION (ORIF) RIGHT ANTERIOR MANDIBULAR FRACTURE; Open Reduction Internal Fixation of Midface fracture; Reduction of septal fracture; arch bars; Repair of lacerations;  Surgeon: Jerrell Belfast, MD;  Location: [place];  Service: ENT;  Laterality: Left;  . TRACHEOSTOMY TUBE PLACEMENT N/A 05/09/2012   Procedure: TRACHEOSTOMY;  Surgeon: Jerrell Belfast, MD;  [facility];  Service: ENT;  Laterality: N/A;    Family History  Problem Relation Age of Onset  . COPD Father   . Lung cancer Unknown   . Arthritis Mother   . Healthy Brother   . Healthy Brother     Social History:  reports that he has never smoked. He has never used smokeless tobacco. He reports current alcohol use. He reports that he does not use drugs.  Review of Systems      THYROID nodules:  Previously on a CT scan he had a thyroid nodule detected in 2015 Ultrasound subsequently showed mostly cystic small nodules bilaterally with the largest being a complex cyst of about 11 mm on the left side  He is  on Xarelto and he is going to continue this indefinitely for prophylaxis  Blood pressure may be higher at times in the office:  BP Readings from Last 3 Encounters:  02/16/18 132/80  12/05/17 (!) 142/92  08/11/17 (!) 144/90   He takes Zoloft 25 mg daily from his PCP for anxiety and depression  General Examination:   BP 132/80 (BP Location: Left Arm, Patient Position: Sitting, Cuff Size: Normal)   Pulse 76   Ht 5' 8"  (1.727 m)   Wt 238 lb 6.4 oz (108.1 kg)   SpO2 97%   BMI 36.25 kg/m      Assessment/ Plan:   Hypogonadotropic hypogonadism, idiopathic  Treated with AndroGel 1%, 1-1/2 packets daily He has been very compliant with this daily  Symptomatically  doing well except for decreased libido  His testosterone level is now stable at around 400 Hemoglobin is upper normal and will need to be followed periodically  He is comfortable continuing the same treatment regimen and will not change the dose at this time  He will come back in 6 months for follow-up with labs  Decreased libido: He will discuss this with PCP, not clear if he may benefit from switching from Zoloft to another medication    Elayne Snare 02/16/2018, 10:36 AM

## 2018-02-18 ENCOUNTER — Other Ambulatory Visit: Payer: Self-pay | Admitting: Endocrinology

## 2018-02-18 NOTE — Telephone Encounter (Signed)
Please refill if appropriate

## 2018-02-18 NOTE — Telephone Encounter (Signed)
Already ordered

## 2018-02-19 ENCOUNTER — Other Ambulatory Visit: Payer: Self-pay | Admitting: Endocrinology

## 2018-02-19 ENCOUNTER — Telehealth: Payer: Self-pay

## 2018-02-19 MED ORDER — TESTOSTERONE 50 MG/5GM (1%) TD GEL: TRANSDERMAL | 2 refills | Status: AC

## 2018-02-19 NOTE — Telephone Encounter (Signed)
Rx printed out for pt to take to pharmacy.

## 2018-03-19 ENCOUNTER — Other Ambulatory Visit: Payer: Self-pay | Admitting: Internal Medicine

## 2018-03-19 DIAGNOSIS — R519 Headache, unspecified: Secondary | ICD-10-CM

## 2018-03-19 DIAGNOSIS — E230 Hypopituitarism: Secondary | ICD-10-CM

## 2018-03-19 DIAGNOSIS — G4730 Sleep apnea, unspecified: Secondary | ICD-10-CM | POA: Diagnosis not present

## 2018-03-19 DIAGNOSIS — N529 Male erectile dysfunction, unspecified: Secondary | ICD-10-CM | POA: Diagnosis not present

## 2018-03-19 DIAGNOSIS — R51 Headache: Secondary | ICD-10-CM

## 2018-03-19 DIAGNOSIS — E237 Disorder of pituitary gland, unspecified: Secondary | ICD-10-CM

## 2018-03-19 DIAGNOSIS — Z86711 Personal history of pulmonary embolism: Secondary | ICD-10-CM | POA: Diagnosis not present

## 2018-03-30 ENCOUNTER — Ambulatory Visit
Admission: RE | Admit: 2018-03-30 | Discharge: 2018-03-30 | Disposition: A | Discharge status: Home / Self Care | Payer: BLUE CROSS/BLUE SHIELD | Source: Ambulatory Visit | Attending: Internal Medicine | Admitting: Internal Medicine

## 2018-03-30 DIAGNOSIS — E230 Hypopituitarism: Secondary | ICD-10-CM

## 2018-03-30 DIAGNOSIS — E236 Other disorders of pituitary gland: Secondary | ICD-10-CM | POA: Diagnosis not present

## 2018-03-30 DIAGNOSIS — R519 Headache, unspecified: Secondary | ICD-10-CM

## 2018-03-30 DIAGNOSIS — R51 Headache: Secondary | ICD-10-CM

## 2018-03-30 DIAGNOSIS — E237 Disorder of pituitary gland, unspecified: Secondary | ICD-10-CM

## 2018-03-30 MED ORDER — GADOBENATE DIMEGLUMINE 529 MG/ML IV SOLN
10.0000 mL | Freq: Once | INTRAVENOUS | Status: AC | PRN
  Administered 2018-03-30: 10 mL via INTRAVENOUS

## 2018-04-01 ENCOUNTER — Ambulatory Visit (INDEPENDENT_AMBULATORY_CARE_PROVIDER_SITE_OTHER): Payer: No Typology Code available for payment source | Admitting: Radiology

## 2018-04-01 ENCOUNTER — Ambulatory Visit (INDEPENDENT_AMBULATORY_CARE_PROVIDER_SITE_OTHER): Payer: No Typology Code available for payment source | Admitting: Family Medicine

## 2018-04-01 ENCOUNTER — Encounter (INDEPENDENT_AMBULATORY_CARE_PROVIDER_SITE_OTHER): Payer: Self-pay | Admitting: Family Medicine

## 2018-04-01 VITALS — BP 125/76 | HR 71 | Temp 98.6°F | Resp 16 | Ht 68.5 in | Wt 235.0 lb

## 2018-04-01 DIAGNOSIS — M25562 Pain in left knee: Secondary | ICD-10-CM

## 2018-04-01 DIAGNOSIS — M25522 Pain in left elbow: Secondary | ICD-10-CM

## 2018-04-01 DIAGNOSIS — M542 Cervicalgia: Secondary | ICD-10-CM
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
## 2018-04-01 NOTE — Patient Instructions (Signed)
Rest  Continue Naprosyn and Gabapentin .  Follow up with PCP if pain is not gradually improving with rest over the next 1-2 weeks.  Prompt follow up if symptoms worsen.

## 2018-04-21 ENCOUNTER — Encounter: Payer: Self-pay | Admitting: Family Medicine

## 2018-04-28 DIAGNOSIS — G4733 Obstructive sleep apnea (adult) (pediatric): Secondary | ICD-10-CM | POA: Diagnosis not present

## 2018-05-21 ENCOUNTER — Encounter: Payer: Self-pay | Admitting: Podiatry

## 2018-05-21 ENCOUNTER — Ambulatory Visit (INDEPENDENT_AMBULATORY_CARE_PROVIDER_SITE_OTHER): Payer: BLUE CROSS/BLUE SHIELD

## 2018-05-21 ENCOUNTER — Ambulatory Visit (INDEPENDENT_AMBULATORY_CARE_PROVIDER_SITE_OTHER): Payer: BLUE CROSS/BLUE SHIELD | Admitting: Podiatry

## 2018-05-21 ENCOUNTER — Other Ambulatory Visit: Payer: Self-pay

## 2018-05-21 VITALS — BP 135/85 | HR 62 | Resp 16

## 2018-05-21 DIAGNOSIS — L603 Nail dystrophy: Secondary | ICD-10-CM | POA: Diagnosis not present

## 2018-05-21 DIAGNOSIS — M722 Plantar fascial fibromatosis: Secondary | ICD-10-CM | POA: Diagnosis not present

## 2018-05-21 DIAGNOSIS — B351 Tinea unguium: Secondary | ICD-10-CM | POA: Diagnosis not present

## 2018-05-21 MED ORDER — METHYLPREDNISOLONE 4 MG PO TBPK: ORAL_TABLET | ORAL | 0 refills | Status: DC

## 2018-05-21 NOTE — Progress Notes (Signed)
Subjective:  Patient ID: [name], male    DOB: 1965/05/16,  MRN: [id number] HPI Chief Complaint  Patient presents with  . Foot Pain    Plantar heels bilateral - throbbing x months, AM pain, sometimes the whole bottom of his foot hurts, been treated by Dr. Gershon Mussel in the past, interested in EPAT   . New Patient (Initial Visit)    53 y.o. male presents with the above complaint.   ROS: Denies fever chills nausea vomiting muscle aches pains calf pain back pain chest pain shortness of breath.  Past Medical History:  Diagnosis Date  . Depression   . Hypertension, essential 12/05/2017  . Hypogonadism male   . Ulcerative colitis, unspecified    Past Surgical History:  Procedure Laterality Date  . MANDIBULAR HARDWARE REMOVAL Bilateral 07/10/2012   Procedure: BILATERAL MANDIBULAR HARDWARE REMOVAL;  Surgeon: Jerrell Belfast, MD;  Location: [facility];  Service: ENT;  Laterality: Bilateral;  . ORIF MANDIBULAR FRACTURE Left 05/09/2012   Procedure: OPEN REDUCTION INTERNAL FIXATION (ORIF) RIGHT ANTERIOR MANDIBULAR FRACTURE; Open Reduction Internal Fixation of Midface fracture; Reduction of septal fracture; arch bars; Repair of lacerations;  Surgeon: Jerrell Belfast, MD;  Location: [facility];  Service: ENT;  Laterality: Left;  . TRACHEOSTOMY TUBE PLACEMENT N/A 05/09/2012   Procedure: TRACHEOSTOMY;  Surgeon: Jerrell Belfast, MD;  Location[facility] OR;  Service: ENT;  Laterality: N/A;    Current Outpatient Medications:  .  diclofenac sodium (VOLTAREN) 1 % GEL, Apply 2 g topically 4 (four) times daily., Disp: 100 g, Rfl: 1 .  methylPREDNISolone (MEDROL DOSEPAK) 4 MG TBPK tablet, 6 day dose pack - take as directed, Disp: 21 tablet, Rfl: 0 .  rivaroxaban (XARELTO) 20 MG TABS tablet, TAKE 1 TABLET BY MOUTH EVERY DAY WITH SUPPER, Disp: 90 tablet, Rfl: 3 .  sertraline (ZOLOFT) 25 MG tablet, TAKE 1 TABLET BY MOUTH EVERY DAY, Disp: 90 tablet, Rfl: 1 .  testosterone (ANDROGEL) 50 MG/5GM (1%) GEL, APPLY 1-1/2 PACKETS ONCE  DAILY on shoulder area, Disp: 300 g, Rfl: 2  Allergies  Allergen Reactions  . Metronidazole Other (See Comments)    REACTION: Fatigue   Review of Systems Objective:   Vitals:   05/21/18 1057  BP: 135/85  Pulse: 62  Resp: 16    General: Well developed, nourished, in no acute distress, alert and oriented x3   Dermatological: Skin is warm, dry and supple bilateral. Nails x 10 are well maintained; remaining integument appears unremarkable at this time. There are no open sores, no preulcerative lesions, no rash or signs of infection present.  Tinea pedis bilateral.  Thick yellow dystrophic most likely mycotic nails.  Vascular: Dorsalis Pedis artery and Posterior Tibial artery pedal pulses are 2/4 bilateral with immedate capillary fill time. Pedal hair growth present. No varicosities and no lower extremity edema present bilateral.   Neruologic: Grossly intact via light touch bilateral. Vibratory intact via tuning fork bilateral. Protective threshold with Semmes Wienstein monofilament intact to all pedal sites bilateral. Patellar and Achilles deep tendon reflexes 2+ bilateral. No Babinski or clonus noted bilateral.   Musculoskeletal: No gross boney pedal deformities bilateral. No pain, crepitus, or limitation noted with foot and ankle range of motion bilateral. Muscular strength 5/5 in all groups tested bilateral.  Mild pes planus bilateral.  He has mild tenderness on palpation mid calcaneal tubercles bilateral.  Gait: Unassisted, Nonantalgic.    Radiographs:  Radiographs taken today demonstrate soft tissue increase in density plantar fascial tibial insertion site no acute findings.  Assessment & Plan:   Assessment: Plantar fasciitis with pes planus.  Painful thickened elongated toenails with tinea pedis.  Possible nail dystrophy.  Plan: Discussed etiology pathology and surgical therapies this point time offered him an injection he declined.  We discussed the possible need for  shockwave therapy.  We discussed orthotics which we will have him scanned today for orthotics.  And also start him on a Medrol Dosepak.  Samples of the nail were taken today for pathologic evaluation.      T. [place], [place]

## 2018-05-24 ENCOUNTER — Other Ambulatory Visit: Payer: Self-pay | Admitting: Family Medicine

## 2018-05-28 ENCOUNTER — Ambulatory Visit: Payer: BLUE CROSS/BLUE SHIELD | Admitting: Internal Medicine

## 2018-05-29 ENCOUNTER — Telehealth: Payer: Self-pay | Admitting: *Deleted

## 2018-05-29 NOTE — Telephone Encounter (Signed)
Left message informing pt Dr. Milinda Pointer had his results and would discuss with him at 06/23/2018 appt.

## 2018-05-29 NOTE — Telephone Encounter (Signed)
-----   Message from Garrel Ridgel, [place] sent at 05/28/2018 11:08 AM EDT ----- Positive for fungus.  Should have an appointment.

## 2018-05-31 DIAGNOSIS — G4733 Obstructive sleep apnea (adult) (pediatric): Secondary | ICD-10-CM | POA: Diagnosis not present

## 2018-06-23 ENCOUNTER — Other Ambulatory Visit: Payer: Self-pay

## 2018-06-23 ENCOUNTER — Ambulatory Visit (INDEPENDENT_AMBULATORY_CARE_PROVIDER_SITE_OTHER): Payer: BLUE CROSS/BLUE SHIELD | Admitting: Podiatry

## 2018-06-23 ENCOUNTER — Ambulatory Visit: Payer: BLUE CROSS/BLUE SHIELD | Admitting: Orthotics

## 2018-06-23 ENCOUNTER — Encounter: Payer: Self-pay | Admitting: Podiatry

## 2018-06-23 VITALS — Temp 98.1°F

## 2018-06-23 DIAGNOSIS — M722 Plantar fascial fibromatosis: Secondary | ICD-10-CM

## 2018-06-23 DIAGNOSIS — L603 Nail dystrophy: Secondary | ICD-10-CM | POA: Diagnosis not present

## 2018-06-23 DIAGNOSIS — Z79899 Other long term (current) drug therapy: Secondary | ICD-10-CM | POA: Diagnosis not present

## 2018-06-23 MED ORDER — TERBINAFINE HCL 250 MG PO TABS: 250.0000 mg | ORAL_TABLET | Freq: Every day | ORAL | 0 refills | Status: DC

## 2018-06-23 NOTE — Patient Instructions (Signed)

## 2018-06-23 NOTE — Progress Notes (Signed)
Patient came in today to pick up custom made foot orthotics.  The goals were accomplished and the patient reported no dissatisfaction with said orthotics.  Patient was advised of breakin period and how to report any issues. 

## 2018-06-24 ENCOUNTER — Telehealth: Payer: Self-pay | Admitting: *Deleted

## 2018-06-24 ENCOUNTER — Encounter: Payer: Self-pay | Admitting: Podiatry

## 2018-06-24 LAB — HEPATIC FUNCTION PANEL
AG Ratio: 1.7 (calc) (ref 1.0–2.5)
ALT: 27 U/L (ref 9–46)
AST: 20 U/L (ref 10–35)
Albumin: 4.5 g/dL (ref 3.6–5.1)
Alkaline phosphatase (APISO): 59 U/L (ref 35–144)
Bilirubin, Direct: 0.1 mg/dL (ref 0.0–0.2)
Globulin: 2.7 g/dL (calc) (ref 1.9–3.7)
Indirect Bilirubin: 0.5 mg/dL (calc) (ref 0.2–1.2)
Total Bilirubin: 0.6 mg/dL (ref 0.2–1.2)
Total Protein: 7.2 g/dL (ref 6.1–8.1)

## 2018-06-24 NOTE — Telephone Encounter (Signed)
-----   Message from Garrel Ridgel, [place] sent at 06/24/2018  3:46 AM EDT ----- Labs WNL - okay to start lamisil - follow up in 1 month

## 2018-06-24 NOTE — Progress Notes (Signed)
He presents today for follow-up of his nail pathology and his plantar fasciitis.  He states that the plantar fasciitis is doing better and he will see [name] today before leaving to pick up his orthotics.  Objective: Vital signs are stable he is alert oriented x3 minimal pain on palpation medial calcaneal tubercles.  Pathology report of the nails does demonstrate onychomycosis with T rubrum.  Assessment: Onychomycosis plan fasciitis.  Plan: [name] today to pick up his orthotics and he was happy with those.  We did after thorough discussion we started him on oral Lamisil therapy 1 tablet daily we are going to request a liver profile should they come back abnormal we will notify [name].  Otherwise I will follow-up with [name] in 1 month

## 2018-06-24 NOTE — Telephone Encounter (Signed)
Left message informing pt of Dr. Stephenie Acres review of results and orders to start his medication.

## 2018-07-21 ENCOUNTER — Encounter: Payer: Self-pay | Admitting: Family Medicine

## 2018-07-21 ENCOUNTER — Encounter: Payer: Self-pay | Admitting: Podiatry

## 2018-07-21 ENCOUNTER — Ambulatory Visit (INDEPENDENT_AMBULATORY_CARE_PROVIDER_SITE_OTHER): Payer: BLUE CROSS/BLUE SHIELD | Admitting: Family Medicine

## 2018-07-21 ENCOUNTER — Other Ambulatory Visit: Payer: Self-pay

## 2018-07-21 ENCOUNTER — Ambulatory Visit (INDEPENDENT_AMBULATORY_CARE_PROVIDER_SITE_OTHER): Payer: BLUE CROSS/BLUE SHIELD | Admitting: Podiatry

## 2018-07-21 VITALS — Temp 97.3°F

## 2018-07-21 DIAGNOSIS — F324 Major depressive disorder, single episode, in partial remission: Secondary | ICD-10-CM | POA: Diagnosis not present

## 2018-07-21 DIAGNOSIS — Z79899 Other long term (current) drug therapy: Secondary | ICD-10-CM

## 2018-07-21 DIAGNOSIS — D6859 Other primary thrombophilia: Secondary | ICD-10-CM | POA: Diagnosis not present

## 2018-07-21 DIAGNOSIS — B351 Tinea unguium: Secondary | ICD-10-CM | POA: Diagnosis not present

## 2018-07-21 DIAGNOSIS — L603 Nail dystrophy: Secondary | ICD-10-CM

## 2018-07-21 MED ORDER — TERBINAFINE HCL 250 MG PO TABS: 250.0000 mg | ORAL_TABLET | Freq: Every day | ORAL | 0 refills | Status: DC

## 2018-07-21 MED ORDER — SERTRALINE HCL 50 MG PO TABS: 50.0000 mg | ORAL_TABLET | Freq: Every day | ORAL | 1 refills | Status: DC

## 2018-07-21 NOTE — Assessment & Plan Note (Signed)
S:last 2 weeks he feels like he has been down and more illtempered for last 2 weeks. zoloft actually typically helps his focus issues- as anxieties tend to get in the way of his focus- he is compliant with 34m dose and has found this effective until recently.   His dog that he has had for 1 years- found out he had inoperable cancer- lost him last Thursday. This Saturday is a year to the day after lost his father  Started terbinafine about 30 days ago- found out that it can raise raise sertraline effect (though I cannot verify this on uptodate.com) [facility]  Decreased Interest 0  Down, Depressed, Hopeless 1  PHQ - 2 Score 1  Altered sleeping 3  Tired, decreased energy 0  Change in appetite 0  Feeling bad or failure about yourself  0  Trouble concentrating 1  Moving slowly or fidgety/restless 0  Suicidal thoughts 0  PHQ-9 Score 5  Difficult doing work/chores Somewhat difficult  A/P: poor control of depression (only partial remission) with phq9 of 5 but no SI. Terbinafine can worsen depression- he would like to stay on if possible - we opted to increase to zoloft 57mfor now and he will update me in 1-2 weeks by mychart (I will likely try to manage through there).  - he is 30 days into terbinafine- will see if we can get him closer to 90 days since using for onychomycosis of toenails

## 2018-07-21 NOTE — Progress Notes (Signed)
Phone [phone]   Subjective:  Virtual visit via Video note. Chief complaint: Chief Complaint  Patient presents with  . Nail Problem    Pt has noticed in the past 2weeks   . Anxiety    Pt stated that he has been "short" with people. Pt noticed that his mind has been wandering lately and keeping him up at night. PT is dealing with loss of family members and work realated stress.    This visit type was conducted due to national recommendations for restrictions regarding the COVID-19 Pandemic (e.g. social distancing).  This format is felt to be most appropriate for this patient at this time balancing risks to patient and risks to population by having him in for in person visit.  No physical exam was performed (except for noted visual exam or audio findings with Telehealth visits).    Our team/I connected with Ferdinand Cava at  4:00 PM EDT by a video enabled telemedicine application (doxy.me or caregility through epic) and verified that I am speaking with the correct person using two identifiers.  Location patient: [facility] Location provider: [facility], office Persons participating in the virtual visit:  patient  Our team/I discussed the limitations of evaluation and management by telemedicine and the availability of in person appointments. In light of current covid-19 pandemic, patient also understands that we are trying to protect them by minimizing in office contact if at all possible.  The patient expressed consent for telemedicine visit and agreed to proceed. Patient understands insurance will be billed.   ROS- some depressed mood, some irritability. No SI. Some troube concentrating. Admits to poor sleep.    Past Medical History-  Patient Active Problem List   Diagnosis Date Noted  . Memory difficulties 03/10/2017    Priority: High  . Hypercoagulable state ([place]) 04/04/2015    Priority: High  . Thyroid nodule 02/01/2014    Priority: High  . Pulmonary embolism ? due to  testosterone therapy 01/31/2014    Priority: High  . Ulcerative colitis ([place]) 03/04/1992    Priority: High  . Obstructive sleep apnea 07/03/2016    Priority: Medium  . History of DVT (deep vein thrombosis) 02/10/2014    Priority: Medium  . Depression, major, single episode, in partial remission ([place]) 01/15/2007    Priority: Medium  . Hyperlipemia 01/09/2007    Priority: Medium  . Mandible open fracture ([place]) 05/09/2012    Priority: Low    Class: Acute  . Open fracture facial bones ([place]) 05/09/2012    Priority: Low    Class: Acute  . 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 Hypogonadism in male 01/15/2007    Priority: Low  . 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 Hypertension, essential 12/05/2017    Medications- reviewed and updated Current Outpatient Medications  Medication Sig Dispense Refill  . diclofenac sodium (VOLTAREN) 1 % GEL Apply 2 g topically 4 (four) times daily. 100 g 1  . terbinafine (LAMISIL) 250 MG tablet Take 1 tablet (250 mg total) by mouth daily. 90 tablet 0  . testosterone (ANDROGEL) 50 MG/5GM (1%) GEL APPLY 1-1/2 PACKETS ONCE DAILY on shoulder area 300 g 2  . XARELTO 20 MG TABS tablet TAKE 1 TABLET BY MOUTH EVERY DAY WITH SUPPER 90 tablet 3  . sertraline (ZOLOFT) 50 MG tablet Take 1 tablet (50 mg total) by mouth daily. 90 tablet 1   No current facility-administered medications for  this visit.      Objective:  no self reported vitals Gen: NAD, resting comfortably in his car Lungs: nonlabored, normal respiratory rate  Skin: appears dry, no obvious rash     Assessment and Plan   # Depression/onychomycosis S:last 2 weeks he feels like he has been down and more illtempered for last 2 weeks. zoloft actually typically helps his focus issues- as anxieties tend to get in the way of his focus- he is compliant with 86m dose and has found this effective until recently.   His dog that he has had for 1  years- found out he had inoperable cancer- lost him last Thursday. This Saturday is a year to the day after lost his father  Started terbinafine about 30 days ago- found out that it can raise raise sertraline effect (though I cannot verify this on [email or web address].com) [facility]2018  Decreased Interest 0  Down, Depressed, Hopeless 1  PHQ - 2 Score 1  Altered sleeping 3  Tired, decreased energy 0  Change in appetite 0  Feeling bad or failure about yourself  0  Trouble concentrating 1  Moving slowly or fidgety/restless 0  Suicidal thoughts 0  PHQ-9 Score 5  Difficult doing work/chores Somewhat difficult  A/P: poor control of depression (only partial remission) with phq9 of 5 but no SI. Terbinafine can worsen depression- he would like to stay on if possible - we opted to increase to zoloft 55mfor now and he will update me in 1-2 weeks by mychart (I will likely try to manage through there).  - he is 30 days into terbinafine- will see if we can get him closer to 90 days since using for onychomycosis of toenails   Hypercoagulable state (HCWhitehouseS: patient being continued on chronic xarelto 20109mong term. Left leg DVT 02/01/14 and also with PE and then superficial clot in left GSV but with potential to extend to distal system noted 01/06/15 so now on lifelong therapy A/P: Stable. Continue current medications.    Future Appointments  Date Time Provider DepHunts Point/15/2020  9:00 AM [facility] None  08/24/2018 10:30 AM KumElayne SnareD [facility] None  09/03/2018  4:00 PM YouDeneise LeverD [facility] None  11/24/2018  3:45 PM Hyatt, Max T, DPM [facility]    Lab/Order associations: Onychomycosis  Depression, major, single episode, in partial remission (HCCSt. LouisHypercoagulable state (HCCClaudeMeds ordered this encounter  Medications  . sertraline (ZOLOFT) 50 MG tablet    Sig: Take 1 tablet (50 mg total) by mouth daily.    Dispense:  90 tablet     Refill:  1    Return precautions advised.  SteGarret ReddishD

## 2018-07-21 NOTE — Patient Instructions (Addendum)
Health Maintenance Due  Topic Date Due  . COLONOSCOPY Pt would like this ordered at another visit to have time to think about it 01/19/2018    Depression screen [facility] 2/9 07/21/2018 12/05/2017 06/09/2017  Decreased Interest 0 0 0  Down, Depressed, Hopeless 1 1 0  PHQ - 2 Score 1 1 0  Altered sleeping 3 0 0  Tired, decreased energy 0 0 0  Change in appetite 0 0 0  Feeling bad or failure about yourself  0 0 0  Trouble concentrating 1 0 0  Moving slowly or fidgety/restless 0 0 0  Suicidal thoughts 0 0 0  PHQ-9 Score 5 1 0  Difficult doing work/chores Somewhat difficult Not difficult at all -   Video visit  Please see GAD7

## 2018-07-21 NOTE — Assessment & Plan Note (Signed)
S: patient being continued on chronic xarelto 23m long term. Left leg DVT 02/01/14 and also with PE and then superficial clot in left GSV but with potential to extend to distal system noted 01/06/15 so now on lifelong therapy A/P: Stable. Continue current medications.

## 2018-07-21 NOTE — Telephone Encounter (Signed)
Called pt, he will not be available for a visit until 4:00 PM. Scheduled for today at 4:00 PM with Dr. Yong Channel.

## 2018-07-22 ENCOUNTER — Encounter: Payer: Self-pay | Admitting: Podiatry

## 2018-07-22 ENCOUNTER — Telehealth: Payer: Self-pay | Admitting: *Deleted

## 2018-07-22 LAB — HEPATIC FUNCTION PANEL
AG Ratio: 1.8 (calc) (ref 1.0–2.5)
ALT: 29 U/L (ref 9–46)
AST: 21 U/L (ref 10–35)
Albumin: 4.7 g/dL (ref 3.6–5.1)
Alkaline phosphatase (APISO): 62 U/L (ref 35–144)
Bilirubin, Direct: 0.1 mg/dL (ref 0.0–0.2)
Globulin: 2.6 g/dL (calc) (ref 1.9–3.7)
Indirect Bilirubin: 0.4 mg/dL (calc) (ref 0.2–1.2)
Total Bilirubin: 0.5 mg/dL (ref 0.2–1.2)
Total Protein: 7.3 g/dL (ref 6.1–8.1)

## 2018-07-22 NOTE — Progress Notes (Signed)
He presents today for follow-up of his nail fungus he is completed 30 tablets.  He states that it feels as though it is decreasing the effectiveness of his Zoloft.  He denies fever chills nausea vomiting muscle aches pains calf pain back pain chest pain shortness of breath fever rashes or itching.  Objective: Vital signs are stable he is alert and oriented x3.  Requesting another liver profile today.  I have recommended that he start taking the medication on an every other day basis or speak to his doctor and see if there is any way to increase his Zoloft.  Otherwise we would need to discontinue the use of his medicine.  There is no changes in his toenails as of yet.  Assessment: Long-term therapy with onychomycosis and Lamisil.  Plan: Consider increasing Zoloft per his physician only.  Otherwise we will go to an every other day to every third day administration of the Lamisil.  And if this fails to alleviate his symptoms then we will discontinue the use of the Lamisil tablet.  We will consider laser therapy at that time.

## 2018-07-22 NOTE — Telephone Encounter (Signed)
Left message informing pt of DR. Hyatt's review of results and orders.

## 2018-07-22 NOTE — Telephone Encounter (Signed)
-----   Message from Garrel Ridgel, [place] sent at 07/22/2018  6:44 AM EDT ----- Blood work looks perfect may continue all medication.

## 2018-07-27 DIAGNOSIS — G4733 Obstructive sleep apnea (adult) (pediatric): Secondary | ICD-10-CM | POA: Diagnosis not present

## 2018-08-08 ENCOUNTER — Other Ambulatory Visit: Payer: Self-pay | Admitting: Family Medicine

## 2018-08-10 DIAGNOSIS — Z5181 Encounter for therapeutic drug level monitoring: Secondary | ICD-10-CM | POA: Diagnosis not present

## 2018-08-10 DIAGNOSIS — E230 Hypopituitarism: Secondary | ICD-10-CM | POA: Diagnosis not present

## 2018-08-10 DIAGNOSIS — E785 Hyperlipidemia, unspecified: Secondary | ICD-10-CM | POA: Diagnosis not present

## 2018-08-10 DIAGNOSIS — Z125 Encounter for screening for malignant neoplasm of prostate: Secondary | ICD-10-CM | POA: Diagnosis not present

## 2018-08-17 ENCOUNTER — Other Ambulatory Visit: Payer: BLUE CROSS/BLUE SHIELD

## 2018-08-24 ENCOUNTER — Ambulatory Visit: Payer: BLUE CROSS/BLUE SHIELD | Admitting: Endocrinology

## 2018-08-25 DIAGNOSIS — E230 Hypopituitarism: Secondary | ICD-10-CM | POA: Diagnosis not present

## 2018-08-25 DIAGNOSIS — N529 Male erectile dysfunction, unspecified: Secondary | ICD-10-CM | POA: Diagnosis not present

## 2018-08-25 DIAGNOSIS — G4730 Sleep apnea, unspecified: Secondary | ICD-10-CM | POA: Diagnosis not present

## 2018-08-25 DIAGNOSIS — Z86711 Personal history of pulmonary embolism: Secondary | ICD-10-CM | POA: Diagnosis not present

## 2018-09-03 ENCOUNTER — Ambulatory Visit: Payer: BLUE CROSS/BLUE SHIELD | Admitting: Internal Medicine

## 2018-09-10 ENCOUNTER — Other Ambulatory Visit: Payer: Self-pay

## 2018-09-10 ENCOUNTER — Emergency Department (HOSPITAL_COMMUNITY): Payer: BC Managed Care – PPO

## 2018-09-10 ENCOUNTER — Emergency Department (HOSPITAL_COMMUNITY)
Admission: EM | Admit: 2018-09-10 | Discharge: 2018-09-10 | Disposition: A | Discharge status: Home / Self Care | Payer: BC Managed Care – PPO | Attending: Emergency Medicine | Admitting: Emergency Medicine

## 2018-09-10 ENCOUNTER — Encounter: Payer: Self-pay | Admitting: Family Medicine

## 2018-09-10 ENCOUNTER — Encounter (HOSPITAL_COMMUNITY): Payer: Self-pay

## 2018-09-10 DIAGNOSIS — H539 Unspecified visual disturbance: Secondary | ICD-10-CM | POA: Diagnosis not present

## 2018-09-10 DIAGNOSIS — H532 Diplopia: Secondary | ICD-10-CM | POA: Diagnosis not present

## 2018-09-10 DIAGNOSIS — H538 Other visual disturbances: Secondary | ICD-10-CM | POA: Diagnosis not present

## 2018-09-10 HISTORY — DX: Acute embolism and thrombosis of unspecified deep veins of unspecified lower extremity: I82.409

## 2018-09-10 HISTORY — DX: Other pulmonary embolism without acute cor pulmonale: I26.99

## 2018-09-10 LAB — DIFFERENTIAL
Abs Immature Granulocytes: 0.02 10*3/uL (ref 0.00–0.07)
Basophils Absolute: 0 10*3/uL (ref 0.0–0.1)
Basophils Relative: 1 %
Eosinophils Absolute: 0.1 10*3/uL (ref 0.0–0.5)
Eosinophils Relative: 1 %
Immature Granulocytes: 0 %
Lymphocytes Relative: 33 %
Lymphs Abs: 1.8 10*3/uL (ref 0.7–4.0)
Monocytes Absolute: 0.7 10*3/uL (ref 0.1–1.0)
Monocytes Relative: 13 %
Neutro Abs: 2.9 10*3/uL (ref 1.7–7.7)
Neutrophils Relative %: 52 %

## 2018-09-10 LAB — CBC
HCT: 50.6 % (ref 39.0–52.0)
Hemoglobin: 16.8 g/dL (ref 13.0–17.0)
MCH: 30.1 pg (ref 26.0–34.0)
MCHC: 33.2 g/dL (ref 30.0–36.0)
MCV: 90.5 fL (ref 80.0–100.0)
Platelets: 225 10*3/uL (ref 150–400)
RBC: 5.59 MIL/uL (ref 4.22–5.81)
RDW: 13.6 % (ref 11.5–15.5)
WBC: 5.5 10*3/uL (ref 4.0–10.5)
nRBC: 0 % (ref 0.0–0.2)

## 2018-09-10 LAB — I-STAT CHEM 8, ED
BUN: 17 mg/dL (ref 6–20)
Calcium, Ion: 1.2 mmol/L (ref 1.15–1.40)
Chloride: 101 mmol/L (ref 98–111)
Creatinine, Ser: 1 mg/dL (ref 0.61–1.24)
Glucose, Bld: 91 mg/dL (ref 70–99)
HCT: 52 % (ref 39.0–52.0)
Hemoglobin: 17.7 g/dL — ABNORMAL HIGH (ref 13.0–17.0)
Potassium: 4 mmol/L (ref 3.5–5.1)
Sodium: 138 mmol/L (ref 135–145)
TCO2: 31 mmol/L (ref 22–32)

## 2018-09-10 LAB — PROTIME-INR
INR: 1.6 — ABNORMAL HIGH (ref 0.8–1.2)
Prothrombin Time: 19.3 seconds — ABNORMAL HIGH (ref 11.4–15.2)

## 2018-09-10 LAB — COMPREHENSIVE METABOLIC PANEL
ALT: 27 U/L (ref 0–44)
AST: 21 U/L (ref 15–41)
Albumin: 4.1 g/dL (ref 3.5–5.0)
Alkaline Phosphatase: 57 U/L (ref 38–126)
Anion gap: 9 (ref 5–15)
BUN: 14 mg/dL (ref 6–20)
CO2: 28 mmol/L (ref 22–32)
Calcium: 9.5 mg/dL (ref 8.9–10.3)
Chloride: 102 mmol/L (ref 98–111)
Creatinine, Ser: 0.97 mg/dL (ref 0.61–1.24)
GFR calc Af Amer: >60 mL/min (ref >60)
GFR calc non Af Amer: >60 mL/min (ref >60)
Glucose, Bld: 94 mg/dL (ref 70–99)
Potassium: 4 mmol/L (ref 3.5–5.1)
Sodium: 139 mmol/L (ref 135–145)
Total Bilirubin: 1 mg/dL (ref 0.3–1.2)
Total Protein: 7.5 g/dL (ref 6.5–8.1)

## 2018-09-10 LAB — APTT: aPTT: 38 seconds — ABNORMAL HIGH (ref 24–36)

## 2018-09-10 NOTE — ED Provider Notes (Signed)
[facility] EMERGENCY DEPARTMENT Provider Note   CSN: [id number] Arrival date & time: 09/10/18  7846     History   Chief Complaint Chief Complaint  Patient presents with  . Visual Field Change    HPI [name] is a 53 y.o. male.     53 year old male who presents with diplopia which began last night after he drank 64 ounces of beer within 3 hours.  States symptoms are worse with both eyes open and better with one eye covered.  They have gradually improved today.  Patient does take a relative due to prior history of DVT.  He denies any DVT symptoms at this time.  No headache.  No trouble ambulating.  Denies any vision loss.  No weakness in his arms or legs.  Nothing makes symptoms better no treatment use prior to arrival.     Past Medical History:  Diagnosis Date  . Depression   . DVT (deep venous thrombosis) ([place])   . Hypertension, essential 12/05/2017  . Hypogonadism male   . Pulmonary embolism ([place])   . Ulcerative colitis, unspecified     Patient Active Problem List   Diagnosis Date Noted  . Hypertension, essential 12/05/2017  . Memory difficulties 03/10/2017  . Obstructive sleep apnea 07/03/2016  . Hypercoagulable state ([place]) 04/04/2015  . History of DVT (deep vein thrombosis) 02/10/2014  . Thyroid nodule 02/01/2014  . Pulmonary embolism ? due to testosterone therapy 01/31/2014  . Mandible open fracture ([place]) 05/09/2012    Class: Acute  . Open fracture facial bones ([place]) 05/09/2012    Class: Acute  . 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 Hypogonadism in male 01/15/2007  . 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 Hyperlipemia 01/09/2007  . 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 Ulcerative colitis ([place]) 03/04/1992    Past Surgical History:  Procedure Laterality Date  . MANDIBULAR HARDWARE REMOVAL Bilateral 07/10/2012   Procedure: BILATERAL MANDIBULAR HARDWARE REMOVAL;  Surgeon: Jerrell Belfast, MD;  Location: [facility];  Service: ENT;  Laterality: Bilateral;  . ORIF MANDIBULAR FRACTURE Left 05/09/2012   Procedure: OPEN REDUCTION INTERNAL FIXATION (ORIF) RIGHT ANTERIOR MANDIBULAR FRACTURE; Open Reduction Internal Fixation of Midface fracture; Reduction of septal fracture; arch bars; Repair of lacerations;  Surgeon: Jerrell Belfast, MD;  Location: [facility];  Service: ENT;  Laterality: Left;  . TRACHEOSTOMY TUBE PLACEMENT N/A 05/09/2012   Procedure: TRACHEOSTOMY;  Surgeon: Jerrell Belfast, MD;  Location: [facility];  Service: ENT;  Laterality: N/A;        Home Medications    Prior to Admission medications   Medication Sig Start Date End Date Taking? Authorizing Provider  diclofenac sodium (VOLTAREN) 1 % GEL Apply 2 g topically 4 (four) times daily. 02/07/17   Marin Olp, MD  sertraline (ZOLOFT) 50 MG tablet Take 1 tablet (50 mg total) by mouth daily. 07/21/18   Marin Olp, MD  terbinafine (LAMISIL) 250 MG tablet Take 1 tablet (250 mg total) by mouth daily. 07/21/18   Hyatt, Max T, DPM  testosterone (ANDROGEL) 50 MG/5GM (1%) GEL APPLY 1-1/2 PACKETS ONCE DAILY on shoulder area 02/19/18   Elayne Snare, MD  XARELTO 20 MG TABS tablet TAKE 1 TABLET BY MOUTH EVERY DAY WITH SUPPER 05/25/18   Marin Olp, MD    Family History Family History  Problem Relation Age of Onset  . COPD Father   . Lung cancer Other   . Arthritis Mother   .  Healthy Brother   . Healthy Brother     Social History Social History   Tobacco Use  . Smoking status: Never Smoker  . Smokeless tobacco: Never Used  Substance Use Topics  . Alcohol use: Yes    Comment: OCCASIONALLY  . Drug use: No     Allergies   Metronidazole   Review of Systems Review of Systems  All other systems reviewed and are negative.    Physical Exam Updated Vital Signs BP (!) 158/94 (BP Location: Left Arm)   Pulse 89   Temp 98.6 F (37 C) (Oral)   Resp 16   SpO2 98%   Physical Exam Vitals signs and nursing  note reviewed.  Constitutional:      General: He is not in acute distress.    Appearance: Normal appearance. He is well-developed. He is not toxic-appearing.  HENT:     Head: Normocephalic and atraumatic.  Eyes:     General: Lids are normal.     Conjunctiva/sclera: Conjunctivae normal.     Pupils: Pupils are equal, round, and reactive to light.  Neck:     Musculoskeletal: Normal range of motion and neck supple.     Thyroid: No thyroid mass.     Trachea: No tracheal deviation.  Cardiovascular:     Rate and Rhythm: Normal rate and regular rhythm.     Heart sounds: Normal heart sounds. No murmur. No gallop.   Pulmonary:     Effort: Pulmonary effort is normal. No respiratory distress.     Breath sounds: Normal breath sounds. No stridor. No decreased breath sounds, wheezing, rhonchi or rales.  Abdominal:     General: Bowel sounds are normal. There is no distension.     Palpations: Abdomen is soft.     Tenderness: There is no abdominal tenderness. There is no rebound.  Musculoskeletal: Normal range of motion.        General: No tenderness.  Skin:    General: Skin is warm and dry.     Findings: No abrasion or rash.  Neurological:     Mental Status: He is alert and oriented to person, place, and time.     GCS: GCS eye subscore is 4. GCS verbal subscore is 5. GCS motor subscore is 6.     Cranial Nerves: No cranial nerve deficit or dysarthria.     Sensory: No sensory deficit.     Motor: No weakness or tremor.     Gait: Gait normal.  Psychiatric:        Speech: Speech normal.        Behavior: Behavior normal.      ED Treatments / Results  Labs (all labs ordered are listed, but only abnormal results are displayed) Labs Reviewed  PROTIME-INR - Abnormal; Notable for the following components:      Result Value   Prothrombin Time 19.3 (*)    INR 1.6 (*)    All other components within normal limits  APTT - Abnormal; Notable for the following components:   aPTT 38 (*)    All other  components within normal limits  I-STAT CHEM 8, ED - Abnormal; Notable for the following components:   Hemoglobin 17.7 (*)    All other components within normal limits  CBC  DIFFERENTIAL  COMPREHENSIVE METABOLIC PANEL  CBG MONITORING, ED    EKG None  Radiology Ct Head Wo Contrast  Result Date: 09/10/2018 CLINICAL DATA:  Onset blurred vision last night at 6 p.m. EXAM: CT HEAD WITHOUT  CONTRAST TECHNIQUE: Contiguous axial images were obtained from the base of the skull through the vertex without intravenous contrast. COMPARISON:  Brain MRI 03/30/2018 FINDINGS: Brain: No evidence of acute infarction, hemorrhage, hydrocephalus, extra-axial collection or mass lesion/mass effect. Vascular: No hyperdense vessel or unexpected calcification. Skull: Normal. Negative for fracture or focal lesion. Sinuses/Orbits: Negative. Other: None. IMPRESSION: Negative head CT. Electronically Signed   By: Inge Rise M.D.   On: 09/10/2018 11:43    Procedures Procedures (including critical care time)  Medications Ordered in ED Medications - No data to display   Initial Impression / Assessment and Plan / ED Course  I have reviewed the triage vital signs and the nursing notes.  Pertinent labs & imaging results that were available during my care of the patient were reviewed by me and considered in my medical decision making (see chart for details).        Patient's head CT was negative at this time.  States his diplopia is improving.  Patient's brain MRI has been ordered at this time and is pending  Final Clinical Impressions(s) / ED Diagnoses   Final diagnoses:  None    ED Discharge Orders    None       Lacretia Leigh, MD 09/10/18 1426

## 2018-09-10 NOTE — Discharge Instructions (Addendum)
You were evaluated in the Emergency Department and after careful evaluation, we did not find any emergent condition requiring admission or further testing in the hospital.  Your MRI today was normal.  Your exam is otherwise reassuring.  Please return to the Emergency Department if you experience any worsening of your condition.  We encourage you to follow up with a primary care provider.  Thank you for allowing [place] to be a part of your care.

## 2018-09-10 NOTE — ED Notes (Signed)
Updated pt on status of MRI.

## 2018-09-10 NOTE — ED Notes (Signed)
Pt to MRI

## 2018-09-10 NOTE — ED Triage Notes (Signed)
Pt endorses blurred vision that began at 1800 last night. Has hx of blood clots and is on xarelto. No other neuro sx. VSS.

## 2018-09-10 NOTE — ED Provider Notes (Signed)
  Provider Note MRN:  [id number]  Arrival date & time: 09/10/18    ED Course and Medical Decision Making  Assumed care from Dr. Zenia Resides at shift change.  MRI is normal.  Upon reassessment, patient has normal vision, no further symptoms.  With a normal MRI, patient is safe for discharge, suspecting the reported alcohol use is possibly contributing.  Patient also mentions that he rarely uses his eyeglasses and they might have contributed to his visual disturbance as well.  Strict return precautions for other neurological deficits or worsening visual impairment.  After the discussed management above, the patient was determined to be safe for discharge.  The patient was in agreement with this plan and all questions regarding their care were answered.  ED return precautions were discussed and the patient will return to the ED with any significant worsening of condition.  Final Clinical Impressions(s) / ED Diagnoses     ICD-10-CM   1. Visual disturbance  H53.9     ED Discharge Orders    None       Barth Kirks. Sedonia Small, Wink [email or web address]    Maudie Flakes, MD 09/10/18 [phone]

## 2018-10-13 ENCOUNTER — Encounter: Payer: Self-pay | Admitting: Family Medicine

## 2018-10-13 ENCOUNTER — Telehealth: Payer: Self-pay

## 2018-10-13 MED ORDER — SERTRALINE HCL 25 MG PO TABS: 25.0000 mg | ORAL_TABLET | Freq: Every day | ORAL | 1 refills | Status: DC

## 2018-10-13 MED ORDER — SILDENAFIL CITRATE 100 MG PO TABS: 50.0000 mg | ORAL_TABLET | Freq: Every day | ORAL | 11 refills | Status: DC | PRN

## 2018-10-13 NOTE — Telephone Encounter (Signed)
Called pt and left VM to call the office.  

## 2018-10-13 NOTE — Telephone Encounter (Signed)
Pt returned call and was advised. F/u visit scheduled.

## 2018-10-13 NOTE — Telephone Encounter (Signed)
I sent in Viagra.  I sent in lower dose of Zoloft-please make sure he is not having worsening depression-specifically make sure no suicidal ideation.  Also lets make sure he has a follow-up within the next 2 to 3 months to see how he does when he goes back down on the dose    Thanks, [name]  ----- Message -----  From: [name], CMA  Sent: 10/13/2018 12:45 PM EDT  To: Ian Olp, MD  Subject: FW: Visit Follow-Up Question               ----- Message -----  From: [name]  Sent: 10/13/2018 12:02 PM EDT  To: [place]  Subject: Visit Follow-Up Question               Hi, Doc. We had bumped up my zoloft a cuple of months ago because I felt it was counteracted by the Terbinifine. I have since quit the Terbinifine, as it had little of the desired effect. As a result, I would like to go back to the original dose of Zoloft (25?). I also feel the increased dosage of zoloft further incapacitated certain male functions. so if possible, I would appreciate an Rx for viagra if possible. I'm still at the [facility] on [address]. Thanks, and have a great day-

## 2018-10-27 DIAGNOSIS — G4733 Obstructive sleep apnea (adult) (pediatric): Secondary | ICD-10-CM | POA: Diagnosis not present

## 2018-11-10 ENCOUNTER — Encounter: Payer: Self-pay | Admitting: Family Medicine

## 2018-11-11 MED ORDER — TADALAFIL 20 MG PO TABS: 10.0000 mg | ORAL_TABLET | ORAL | 11 refills | Status: DC | PRN

## 2018-11-24 ENCOUNTER — Other Ambulatory Visit: Payer: Self-pay

## 2018-11-24 ENCOUNTER — Encounter: Payer: Self-pay | Admitting: Podiatry

## 2018-11-24 ENCOUNTER — Ambulatory Visit: Payer: Self-pay | Admitting: Podiatry

## 2018-11-24 DIAGNOSIS — M79676 Pain in unspecified toe(s): Secondary | ICD-10-CM

## 2018-11-24 DIAGNOSIS — L603 Nail dystrophy: Secondary | ICD-10-CM | POA: Diagnosis not present

## 2018-11-24 MED ORDER — TERBINAFINE HCL 250 MG PO TABS: 250.0000 mg | ORAL_TABLET | Freq: Every day | ORAL | 0 refills | Status: DC

## 2018-11-24 NOTE — Progress Notes (Signed)
He presents today for follow-up of his nail fungus he states that he quit taking the medicine a couple of months ago because of the way that he was interacting with his Zoloft.  However he is very happy with how his nails have grown out.  He states that everything seems to be growing very nicely he is only taking it for a month or so but has done very well.  Objective: Vital signs are stable he is alert and oriented x3.  Pulses are palpable.  Nails have grown out approximately 98%.  Assessment well-healing onychomycosis long-term therapy with Lamisil.  Plan: We will request that he take 1 tablet every other day and less it does interfere with his Zoloft.  He can take it every third day if necessary.  I will follow-up with him as needed.

## 2018-11-27 DIAGNOSIS — G4733 Obstructive sleep apnea (adult) (pediatric): Secondary | ICD-10-CM | POA: Diagnosis not present

## 2018-11-29 ENCOUNTER — Other Ambulatory Visit: Payer: Self-pay | Admitting: Family Medicine

## 2018-11-30 ENCOUNTER — Encounter: Payer: Self-pay | Admitting: Family Medicine

## 2018-12-21 ENCOUNTER — Other Ambulatory Visit: Payer: Self-pay

## 2018-12-21 ENCOUNTER — Ambulatory Visit (INDEPENDENT_AMBULATORY_CARE_PROVIDER_SITE_OTHER): Payer: BC Managed Care – PPO | Admitting: Internal Medicine

## 2018-12-21 ENCOUNTER — Encounter: Payer: Self-pay | Admitting: Internal Medicine

## 2018-12-21 DIAGNOSIS — I2699 Other pulmonary embolism without acute cor pulmonale: Secondary | ICD-10-CM

## 2018-12-21 DIAGNOSIS — G4733 Obstructive sleep apnea (adult) (pediatric): Secondary | ICD-10-CM

## 2018-12-21 NOTE — Progress Notes (Signed)
HPI male never smoker followed for OSA, complicated by history of facial fracture/trauma, history DVT/PE/ Xarelto, ulcerative colitis Unattended Home Sleep Test 07/17/16-AHI 40.7/hour, desaturation to 81%, body weight 221 pounds  -------------------------------------------------------------------------------  05/01/17-53 year old male never smoker followed for OSA, complicated by history of facial fracture/trauma, history DVT/PE/ Xarelto, ulcerative colitis  CPAP auto 5-20/[facility] ------OSA: DME: [facility]. Pt wears CPAP and DL attached. Will need order for new supplies.  Download 100% compliance, AHI 0.9/hour.  He reports daytime sleepiness is better, waking at night less, not snoring.  He may not have quite as much energy as he had hoped but realizes that we have done what could be done to correct his sleep apnea.  He understands to be sure he gets enough sleep.  12/21/2018- 53 year old male never smoker followed for OSA, complicated by history of facial fracture/trauma, history DVT/PE/ Xarelto, ulcerative colitis  CPAP auto 5-20/[facility] -----OSA on CPAP 5-20, DME: [facility]; no complaints  Body weight today 224 lbs Download compliance 100%, AHI 1.4/ hr He states he is better off with CPAP.  Declines flu vax and states general health is good. Remains on Xarelto for VTE.  ROS-see HPI   + = positive Constitutional:    weight loss, night sweats, fevers, chills, +fatigue, lassitude. HEENT:    headaches, difficulty swallowing, tooth/dental problems, sore throat,       sneezing, itching, ear ache, nasal congestion, post nasal drip, snoring CV:    chest pain, orthopnea, PND, swelling in lower extremities, anasarca,                                                    dizziness, palpitations Resp:   shortness of breath with exertion or at rest.                productive cough,   non-productive cough, coughing up of blood.              change in color of mucus.  wheezing.   Skin:    rash or  lesions. GI:  No-   heartburn, indigestion, abdominal pain, nausea, vomiting, diarrhea,                 change in bowel habits, loss of appetite GU: dysuria, change in color of urine, no urgency or frequency.   flank pain. MS:   joint pain, stiffness, decreased range of motion, back pain. Neuro-     nothing unusual Psych:  change in mood or affect.  depression or anxiety.   memory loss.  OBJ- Physical Exam General- Alert, Oriented, Affect-appropriate, Distress- none acute.  + Overweight/ stocky Skin- rash-none, + tattoos Lymphadenopathy- none Head- atraumatic            Eyes- Gross vision intact, PERRLA, conjunctivae and secretions clear            Ears- Hearing, canals-normal            Nose- Clear, Septal dev+, No- mucus, polyps, erosion, perforation             Throat- Mallampati III , mucosa clear , drainage- none, tonsils- atrophic Neck- flexible , trachea midline, no stridor , thyroid nl, carotid no bruit Chest - symmetrical excursion , unlabored           Heart/CV- RRR , no murmur , no gallop  , no rub, nl  s1 s2                           - JVD- none , edema- none, stasis changes- none, varices- none           Lung- clear to P&A, wheeze- none, cough- none , dullness-none, rub- none           Chest wall-  Abd-  Br/ Gen/ Rectal- Not done, not indicated Extrem- cyanosis- none, clubbing, none, atrophy- none, strength- nl Neuro- grossly intact to observation

## 2018-12-21 NOTE — Patient Instructions (Signed)
We can continue CPAP auto 5-20, mask of choice, humidifier,supplies, [facility]/ card  Please call if we can help

## 2018-12-22 NOTE — Assessment & Plan Note (Signed)
Remains on Xarelto with no overt blood loss. Managed elsewhere.

## 2018-12-22 NOTE — Assessment & Plan Note (Signed)
Benefits from CPAP. Comfort and compliance reviewed. Plan- continue auto 5-20

## 2018-12-23 NOTE — Patient Instructions (Addendum)
Health Maintenance Due  Topic Date Due  . COLONOSCOPY - referred today 01/19/2018   6 months depression follow up. 1 year physical.   Your blood pressure trend concerns me- last 2 visits above 140. I would like for you to buy/use a home cuff to check at least 4x a week. Your goal is <140/90. Update me by mychart in 1 month with your readings- we can consider meds if needed- continue efforts for healthy low salt diet and regular exercise as those both help.    Please stop by lab before you go If you do not have mychart- we will call you about results within 5 business days of [place] receiving them.  If you have mychart- we will send your results within 3 business days of [place] receiving them.  If abnormal or we want to clarify a result, we will call or mychart you to make sure you receive the message.  If you have questions or concerns or don't hear within 5-7 days, please send [place] a message or call us.   DASH Eating Plan DASH stands for "Dietary Approaches to Stop Hypertension." The DASH eating plan is a healthy eating plan that has been shown to reduce high blood pressure (hypertension). It may also reduce your risk for type 2 diabetes, heart disease, and stroke. The DASH eating plan may also help with weight loss. What are tips for following this plan?  General guidelines  Avoid eating more than 2,300 mg (milligrams) of salt (sodium) a day. If you have hypertension, you may need to reduce your sodium intake to 1,500 mg a day.  Limit alcohol intake to no more than 1 drink a day for nonpregnant women and 2 drinks a day for men. One drink equals 12 oz of beer, 5 oz of wine, or 1 oz of hard liquor.  Work with your health care provider to maintain a healthy body weight or to lose weight. Ask what an ideal weight is for you.  Get at least 30 minutes of exercise that causes your heart to beat faster (aerobic exercise) most days of the week. Activities may include walking, swimming, or biking.  Work  with your health care provider or diet and nutrition specialist (dietitian) to adjust your eating plan to your individual calorie needs. Reading food labels   Check food labels for the amount of sodium per serving. Choose foods with less than 5 percent of the Daily Value of sodium. Generally, foods with less than 300 mg of sodium per serving fit into this eating plan.  To find whole grains, look for the word "whole" as the first word in the ingredient list. Shopping  Buy products labeled as "low-sodium" or "no salt added."  Buy fresh foods. Avoid canned foods and premade or frozen meals. Cooking  Avoid adding salt when cooking. Use salt-free seasonings or herbs instead of table salt or sea salt. Check with your health care provider or pharmacist before using salt substitutes.  Do not fry foods. Cook foods using healthy methods such as baking, boiling, grilling, and broiling instead.  Cook with heart-healthy oils, such as olive, canola, soybean, or sunflower oil. Meal planning  Eat a balanced diet that includes: ? 5 or more servings of fruits and vegetables each day. At each meal, try to fill half of your plate with fruits and vegetables. ? Up to 6-8 servings of whole grains each day. ? Less than 6 oz of lean meat, poultry, or fish each day. A 3-oz serving  of meat is about the same size as a deck of cards. One egg equals 1 oz. ? 2 servings of low-fat dairy each day. ? A serving of nuts, seeds, or beans 5 times each week. ? Heart-healthy fats. Healthy fats called Omega-3 fatty acids are found in foods such as flaxseeds and coldwater fish, like sardines, salmon, and mackerel.  Limit how much you eat of the following: ? Canned or prepackaged foods. ? Food that is high in trans fat, such as fried foods. ? Food that is high in saturated fat, such as fatty meat. ? Sweets, desserts, sugary drinks, and other foods with added sugar. ? Full-fat dairy products.  Do not salt foods before  eating.  Try to eat at least 2 vegetarian meals each week.  Eat more home-cooked food and less restaurant, buffet, and fast food.  When eating at a restaurant, ask that your food be prepared with less salt or no salt, if possible. What foods are recommended? The items listed may not be a complete list. Talk with your dietitian about what dietary choices are best for you. Grains Whole-grain or whole-wheat bread. Whole-grain or whole-wheat pasta. Brown rice. [place]. Bulgur. Whole-grain and low-sodium cereals. Pita bread. Low-fat, low-sodium crackers. Whole-wheat flour tortillas. Vegetables Fresh or frozen vegetables (raw, steamed, roasted, or grilled). Low-sodium or reduced-sodium tomato and vegetable juice. Low-sodium or reduced-sodium tomato sauce and tomato paste. Low-sodium or reduced-sodium canned vegetables. Fruits All fresh, dried, or frozen fruit. Canned fruit in natural juice (without added sugar). Meat and other protein foods Skinless chicken or [place]. Ground chicken or [place]. Pork with fat trimmed off. Fish and seafood. Egg whites. Dried beans, peas, or lentils. Unsalted nuts, nut butters, and seeds. Unsalted canned beans. Lean cuts of beef with fat trimmed off. Low-sodium, lean deli meat. Dairy Low-fat (1%) or fat-free (skim) milk. Fat-free, low-fat, or reduced-fat cheeses. Nonfat, low-sodium ricotta or cottage cheese. Low-fat or nonfat yogurt. Low-fat, low-sodium cheese. Fats and oils Soft margarine without trans fats. Vegetable oil. Low-fat, reduced-fat, or light mayonnaise and salad dressings (reduced-sodium). Canola, safflower, olive, soybean, and sunflower oils. Avocado. Seasoning and other foods Herbs. Spices. Seasoning mixes without salt. Unsalted popcorn and pretzels. Fat-free sweets. What foods are not recommended? The items listed may not be a complete list. Talk with your dietitian about what dietary choices are best for you. Grains Baked goods made with fat,  such as croissants, muffins, or some breads. Dry pasta or rice meal packs. Vegetables Creamed or fried vegetables. Vegetables in a cheese sauce. Regular canned vegetables (not low-sodium or reduced-sodium). Regular canned tomato sauce and paste (not low-sodium or reduced-sodium). Regular tomato and vegetable juice (not low-sodium or reduced-sodium). Angie Fava. Olives. Fruits Canned fruit in a light or heavy syrup. Fried fruit. Fruit in cream or butter sauce. Meat and other protein foods Fatty cuts of meat. Ribs. Fried meat. Berniece Salines. Sausage. Bologna and other processed lunch meats. Salami. Fatback. Hotdogs. Bratwurst. Salted nuts and seeds. Canned beans with added salt. Canned or smoked fish. Whole eggs or egg yolks. Chicken or [place] with skin. Dairy Whole or 2% milk, cream, and half-and-half. Whole or full-fat cream cheese. Whole-fat or sweetened yogurt. Full-fat cheese. Nondairy creamers. Whipped toppings. Processed cheese and cheese spreads. Fats and oils Butter. Stick margarine. Lard. Shortening. Ghee. Bacon fat. Tropical oils, such as coconut, palm kernel, or palm oil. Seasoning and other foods Salted popcorn and pretzels. Onion salt, garlic salt, seasoned salt, table salt, and sea salt. Worcestershire sauce. Tartar sauce. Barbecue sauce.  Teriyaki sauce. Soy sauce, including reduced-sodium. Steak sauce. Canned and packaged gravies. Fish sauce. Oyster sauce. Cocktail sauce. Horseradish that you find on the shelf. Ketchup. Mustard. Meat flavorings and tenderizers. Bouillon cubes. Hot sauce and Tabasco sauce. Premade or packaged marinades. Premade or packaged taco seasonings. Relishes. Regular salad dressings. Where to find more information:  National Heart, Lung, and [place]: https://wilson-eaton.com/  American Heart Association: www.heart.org Summary  The DASH eating plan is a healthy eating plan that has been shown to reduce high blood pressure (hypertension). It may also reduce your risk for  type 2 diabetes, heart disease, and stroke.  With the DASH eating plan, you should limit salt (sodium) intake to 2,300 mg a day. If you have hypertension, you may need to reduce your sodium intake to 1,500 mg a day.  When on the DASH eating plan, aim to eat more fresh fruits and vegetables, whole grains, lean proteins, low-fat dairy, and heart-healthy fats.  Work with your health care provider or diet and nutrition specialist (dietitian) to adjust your eating plan to your individual calorie needs. This information is not intended to replace advice given to you by your health care provider. Make sure you discuss any questions you have with your health care provider. Document Released: 02/07/2011 Document Revised: 01/31/2017 Document Reviewed: 02/12/2016 Elsevier Patient Education  2020 [employer].

## 2018-12-23 NOTE — Progress Notes (Signed)
Phone [phone]   Subjective:  [name] is a 53 y.o. year old very pleasant male patient who presents for/with See problem oriented charting Chief Complaint  Patient presents with  . Annual physical and follow up  Depression Current symptoms include no symptoms at this time. . Symptoms have been stable. Patient denies current suicidal and homicidal ideation. Previous treatment includes: medications. . Side effects from the treatment: none. Alcohol use: occasional .  Drug XHB:ZJIR.  Exercise: 2 times a week for 2 hrs. .       Review of Systems  Constitutional: Negative for chills and fever.  HENT: Negative for hearing loss and tinnitus.   Eyes: Negative for blurred vision and double vision (resolved in july).  Respiratory: Negative for cough and hemoptysis.   Cardiovascular: Negative for chest pain and palpitations.  Gastrointestinal: Negative for heartburn and nausea.  Genitourinary: Negative for dysuria and urgency.  Musculoskeletal: Negative for myalgias and neck pain.  Skin: Negative for itching and rash.  Neurological: Negative for dizziness and headaches.  Endo/Heme/Allergies: Negative for polydipsia. Does not bruise/bleed easily.  Psychiatric/Behavioral: Negative for depression and suicidal ideas.   The following were reviewed and entered/updated in epic: Past Medical History:  Diagnosis Date  . Depression   . DVT (deep venous thrombosis) ([place])   . Hypertension, essential 12/05/2017  . Hypogonadism male   . Pulmonary embolism ([place])   . Ulcerative colitis, unspecified    Patient Active Problem List   Diagnosis Date Noted  . Memory difficulties 03/10/2017    Priority: High  . Hypercoagulable state ([place]) 04/04/2015    Priority: High  . Thyroid nodule 02/01/2014    Priority: High  . Pulmonary embolism ? due to testosterone therapy 01/31/2014    Priority: High  . Ulcerative colitis ([place]) 03/04/1992    Priority: High  . Hypertension, essential 12/05/2017   Priority: Medium  . Obstructive sleep apnea 07/03/2016    Priority: Medium  . History of DVT (deep vein thrombosis) 02/10/2014    Priority: Medium  . Major depression in full remission ([place]) 01/15/2007    Priority: Medium  . Hyperlipemia 01/09/2007    Priority: Medium  . 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 Hypogonadism in male 01/15/2007    Priority: Low  . NEOP, MALIGNANT, SKIN, TRUNK 10/06/2006    Priority: Low   Past Surgical History:  Procedure Laterality Date  . MANDIBULAR HARDWARE REMOVAL Bilateral 07/10/2012   Procedure: BILATERAL MANDIBULAR HARDWARE REMOVAL;  Surgeon: Jerrell Belfast, MD;  Location: [place];  Service: ENT;  Laterality: Bilateral;  . ORIF MANDIBULAR FRACTURE Left 05/09/2012   Procedure: OPEN REDUCTION INTERNAL FIXATION (ORIF) RIGHT ANTERIOR MANDIBULAR FRACTURE; Open Reduction Internal Fixation of Midface fracture; Reduction of septal fracture; arch bars; Repair of lacerations;  Surgeon: Jerrell Belfast, MD;  Location: [place];  Service: ENT;  Laterality: Left;  . TRACHEOSTOMY TUBE PLACEMENT N/A 05/09/2012   Procedure: TRACHEOSTOMY;  Surgeon: Jerrell Belfast, MD;  [facility];  Service: ENT;  Laterality: N/A;    Family History  Problem Relation Age of Onset  . COPD Father   . Lung cancer Other   . Arthritis Mother   . Healthy Brother   . Healthy Brother     Medications- reviewed and updated Current Outpatient Medications  Medication Sig Dispense Refill  . diclofenac sodium (VOLTAREN) 1 % GEL Apply 2 g topically 4 (four) times daily. (Patient taking differently: Apply 2 g topically 2 (two)  times daily as needed (PAIN). ) 100 g 1  . sertraline (ZOLOFT) 50 MG tablet TAKE 1 TABLET BY MOUTH EVERY DAY (Patient taking differently: 25 mg. ) 90 tablet 1  . tadalafil (CIALIS) 20 MG tablet Take 0.5-1 tablets (10-20 mg total) by mouth every other day as needed for erectile dysfunction. 10 tablet 11  . testosterone (ANDROGEL) 50 MG/5GM  (1%) GEL APPLY 1-1/2 PACKETS ONCE DAILY on shoulder area 300 g 2  . XARELTO 20 MG TABS tablet TAKE 1 TABLET BY MOUTH EVERY DAY WITH SUPPER (Patient taking differently: Take 20 mg by mouth daily. ) 90 tablet 3   No current facility-administered medications for this visit.     Allergies-reviewed and updated Allergies  Allergen Reactions  . 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    Objective  Objective:  BP 140/88   Pulse 96   Temp 98.7 F (37.1 C) (Temporal)   Ht 5' 8"  (1.727 m)   Wt 227 lb 6.4 oz (103.1 kg)   SpO2 96%   BMI 34.58 kg/m  Gen: NAD, resting comfortably HEENT: Mask not removed due to covid 19. TM normal. Bridge of nose normal. Eyelids normal.  Neck: no thyromegaly or cervical lymphadenopathy  CV: RRR no murmurs rubs or gallops Lungs: CTAB no crackles, wheeze, rhonchi Abdomen: soft/nontender/nondistended/normal bowel sounds. No rebound or guarding.  Ext: no edema Skin: warm, dry Neuro: grossly normal, moves all extremities, PERRLA Rectal: normal tone, normal sized prostate, no masses or tenderness    Assessment and Plan  53 y.o. male presenting for annual physical.  Health Maintenance counseling: 1. Anticipatory guidance: Patient counseled regarding regular dental exams - needs to schedule q6 months, eye exams -yearly,  avoidin drg smoking and second hand smoke , limiting alcohol to 2 beverages per day - generally under this- did have higher intake when had double vision- see ER note 09/2018.   2. Risk factor reduction:  Advised patient of need for regular exercise and diet rich and fruits and vegetables to reduce risk of heart attack and stroke. Exercise- 2x a week for 2 hours- hiking 5 mile trail around [place]- helped him lose 20 lbs - but has regained some. Diet-drinks mainly water, tries to eat healthy, enjoys  salads. Tries to avoid processed foods. Trying to work on this.  Wt Readings from Last 3 Encounters:  12/28/18 227 lb 6.4 oz (103.1 kg)  12/21/18 224 lb 9.6 oz (101.9 kg)  02/16/18 238 lb 6.4 oz (108.1 kg)  3. Immunizations/screenings/ancillary studies- declines flu shot.  Immunization History  Administered Date(s) Administered  . DTP 10/06/2006  . Influenza,inj,Quad PF,6+ Mos 02/02/2014, 12/18/2017  . Influenza-Unspecified 02/02/2014, 11/03/2014, 12/02/2017, 12/18/2017  . Td 03/05/1991, 10/06/2006  . Tdap 03/05/1991, 10/06/2006, 05/09/2012  4. Prostate cancer screening-  Will screen PSA since he is on testosterone- also get CBC due to risks of polythemia  Lab Results  Component Value Date   PSA 1.12 05/10/2013   5. Colon cancer screening - will refer today- also has ulcerative colitis that needs follow up 6. Skin cancer screening- no dermatologist. advised regular sunscreen use. Denies worrisome, changing, or new skin lesions.  7. Never smoker  Status of chronic or acute concerns   # Depression S: Only partial remission of depression at last visit with PHQ 9 at  5.  No SI at that time.  We increased patient's sertraline from 25 mg to 50 mg.  He was on terbinafine at last visit which potentially could worsen depression.  Today patient reports that he did have depressive symptoms on terbinafine even with increasing to 80m zoloft- he eventually stopped terbinafine at 6 weeks but luckily toe nails still resolved.    New GF is a better fit for him. Cialis is working well for him. He overall has noted less stress and anxiety [facility]  Decreased Interest 0  Down, Depressed, Hopeless 0  PHQ - 2 Score 0  Altered sleeping 0  Tired, decreased energy 0  Change in appetite 0  Feeling bad or failure about yourself  0  Trouble concentrating 0  Moving slowly or fidgety/restless 0  Suicidal thoughts 0  PHQ-9 Score 0  Difficult doing work/chores Not difficult at  all  A/P:  Very well controlled- will continue low dose of zoloft 25 mg - memory issues stable recently   #Hypercoagulable state S: Patient remains on chronic Xarelto 20 mg.  Left leg DVT February 01, 2014 also with PE and then later had a superficial clot in left GSV but with potential to extend the distal system noted 01/06/2015 so now on lifelong therapy.  #Ulcerative colitis S: no recent bleeding issues or abdominal ramping- "pleasantly dormant" for years. He states does need updated colonoscopy.  A/P: appears well controlled- needs updated colonoscopy for screening purposes anyway- will refer today   % Hypogonadism- remains on testosterone but no recent issues. Doesn't see Dr. KDwyane Dee Sees Dr. KBuddy Dutywith ESadie Haber   % Dipopia in July 3 after drinking some beer- wonders if something was placed in drink. MRI was normal and has not recurred.   # plantar fasciitis better with orthotics from podiatry  # mild hyperlipidemia- will update lipids- and consider statin based off ascvd risk. Would prefer to remain off statin- we agreed as long as risk below 10% today - with goal under 7.5% that we would continue to work on lifestyle and recheck 1 year  #thyroid nodule- had been followed by Dr. KDwyane Dee he is going to talk to Dr. KBuddy Dutyabout this. We will get TSH today to make sure normal.   # hypertension- poor control today- we discussed doing some home monitoring and updating me in 1 month- if remaining high we discussed potentially using medication- continue efforts for healthy eating and regular exercise.  BP Readings from Last 3 Encounters:  12/28/18 140/88  12/21/18 (!) 142/98  09/10/18 128/78   # OSA on cpap regularly.  Recommended follow up: 6 months depression follow up. 1 year physical.  Future Appointments  Date Time Provider DWrigley 12/27/2019  9:00 AM YDeneise Lever MD [facility] None    Lab/Order associations:not fasting- had chick fil a    ICD-10-CM   1. Screening  for colon cancer  Z12.11 Ambulatory referral to Gastroenterology  2. Ulcerative colitis with complication, unspecified location ([facility] Chronic K51.919   3. Hyperlipidemia, unspecified hyperlipidemia type  E78.5 CBC with Differential/Platelet    Comprehensive metabolic panel    Lipid panel  4. Screening for prostate cancer  Z12.5 PSA  5. High risk medication use  Z79.899 PSA  6. Preventative health care  Z00.00 CBC with Differential/Platelet    Comprehensive metabolic panel    Lipid panel    PSA  7. Recurrent major depressive disorder, in full remission ([place]  F33.42   8. Thyroid  nodule  E04.1 TSH  9. Hypercoagulable state ([place])  D68.59   10. Other acute pulmonary embolism, unspecified whether acute cor pulmonale present (HCC)  I26.99   11. Hypogonadism in male  E29.1    Return precautions advised.  Garret Reddish, MD

## 2018-12-28 ENCOUNTER — Ambulatory Visit (INDEPENDENT_AMBULATORY_CARE_PROVIDER_SITE_OTHER): Payer: BC Managed Care – PPO | Admitting: Family Medicine

## 2018-12-28 ENCOUNTER — Encounter: Payer: Self-pay | Admitting: Family Medicine

## 2018-12-28 ENCOUNTER — Other Ambulatory Visit: Payer: Self-pay

## 2018-12-28 VITALS — BP 140/88 | HR 96 | Temp 98.7°F | Ht 68.0 in | Wt 227.4 lb

## 2018-12-28 DIAGNOSIS — E041 Nontoxic single thyroid nodule: Secondary | ICD-10-CM | POA: Diagnosis not present

## 2018-12-28 DIAGNOSIS — Z125 Encounter for screening for malignant neoplasm of prostate: Secondary | ICD-10-CM

## 2018-12-28 DIAGNOSIS — I2699 Other pulmonary embolism without acute cor pulmonale: Secondary | ICD-10-CM

## 2018-12-28 DIAGNOSIS — K51919 Ulcerative colitis, unspecified with unspecified complications: Secondary | ICD-10-CM | POA: Diagnosis not present

## 2018-12-28 DIAGNOSIS — E785 Hyperlipidemia, unspecified: Secondary | ICD-10-CM

## 2018-12-28 DIAGNOSIS — Z0000 Encounter for general adult medical examination without abnormal findings: Secondary | ICD-10-CM

## 2018-12-28 DIAGNOSIS — R972 Elevated prostate specific antigen [PSA]: Secondary | ICD-10-CM

## 2018-12-28 DIAGNOSIS — E291 Testicular hypofunction: Secondary | ICD-10-CM

## 2018-12-28 DIAGNOSIS — Z79899 Other long term (current) drug therapy: Secondary | ICD-10-CM

## 2018-12-28 DIAGNOSIS — D6859 Other primary thrombophilia: Secondary | ICD-10-CM

## 2018-12-28 DIAGNOSIS — F3342 Major depressive disorder, recurrent, in full remission: Secondary | ICD-10-CM

## 2018-12-28 DIAGNOSIS — Z1211 Encounter for screening for malignant neoplasm of colon: Secondary | ICD-10-CM

## 2018-12-28 LAB — CBC WITH DIFFERENTIAL/PLATELET
Basophils Absolute: 0 10*3/uL (ref 0.0–0.1)
Basophils Relative: 0.4 % (ref 0.0–3.0)
Eosinophils Absolute: 0.1 10*3/uL (ref 0.0–0.7)
Eosinophils Relative: 1.9 % (ref 0.0–5.0)
HCT: 50.5 % (ref 39.0–52.0)
Hemoglobin: 17.1 g/dL — ABNORMAL HIGH (ref 13.0–17.0)
Lymphocytes Relative: 38.1 % (ref 12.0–46.0)
Lymphs Abs: 2.1 10*3/uL (ref 0.7–4.0)
MCHC: 33.8 g/dL (ref 30.0–36.0)
MCV: 90 fl (ref 78.0–100.0)
Monocytes Absolute: 0.7 10*3/uL (ref 0.1–1.0)
Monocytes Relative: 13.3 % — ABNORMAL HIGH (ref 3.0–12.0)
Neutro Abs: 2.6 10*3/uL (ref 1.4–7.7)
Neutrophils Relative %: 46.3 % (ref 43.0–77.0)
Platelets: 221 10*3/uL (ref 150.0–400.0)
RBC: 5.61 Mil/uL (ref 4.22–5.81)
RDW: 13.9 % (ref 11.5–15.5)
WBC: 5.6 10*3/uL (ref 4.0–10.5)

## 2018-12-28 LAB — LIPID PANEL
Cholesterol: 202 mg/dL — ABNORMAL HIGH (ref 0–200)
HDL: 29.7 mg/dL — ABNORMAL LOW (ref >39.00)
Total CHOL/HDL Ratio: 7
Triglycerides: 428 mg/dL — ABNORMAL HIGH (ref 0.0–149.0)

## 2018-12-28 LAB — COMPREHENSIVE METABOLIC PANEL
ALT: 27 U/L (ref 0–53)
AST: 20 U/L (ref 0–37)
Albumin: 4.5 g/dL (ref 3.5–5.2)
Alkaline Phosphatase: 58 U/L (ref 39–117)
BUN: 13 mg/dL (ref 6–23)
CO2: 29 mEq/L (ref 19–32)
Calcium: 9.7 mg/dL (ref 8.4–10.5)
Chloride: 100 mEq/L (ref 96–112)
Creatinine, Ser: 1.05 mg/dL (ref 0.40–1.50)
GFR: 74.48 mL/min (ref >60.00)
Glucose, Bld: 85 mg/dL (ref 70–99)
Potassium: 4.1 mEq/L (ref 3.5–5.1)
Sodium: 139 mEq/L (ref 135–145)
Total Bilirubin: 1.1 mg/dL (ref 0.2–1.2)
Total Protein: 7.4 g/dL (ref 6.0–8.3)

## 2018-12-28 LAB — LDL CHOLESTEROL, DIRECT: Direct LDL: 120 mg/dL

## 2018-12-28 LAB — TSH: TSH: 1.07 u[IU]/mL (ref 0.35–4.50)

## 2018-12-28 LAB — PSA: PSA: 2.65 ng/mL (ref 0.10–4.00)

## 2018-12-28 MED ORDER — SERTRALINE HCL 25 MG PO TABS: 25.0000 mg | ORAL_TABLET | Freq: Every day | ORAL | 3 refills | Status: DC

## 2018-12-28 MED ORDER — RIVAROXABAN 20 MG PO TABS: 20.0000 mg | ORAL_TABLET | Freq: Every day | ORAL | 3 refills | Status: DC

## 2018-12-28 MED ORDER — TADALAFIL 20 MG PO TABS: 10.0000 mg | ORAL_TABLET | ORAL | 11 refills | Status: DC | PRN

## 2018-12-28 NOTE — Assessment & Plan Note (Signed)
#  thyroid nodule- had been followed by Dr. Dwyane Dee- he is going to talk to Dr. Buddy Duty about this. We will get TSH today to make sure normal.

## 2019-01-05 ENCOUNTER — Encounter: Payer: Self-pay | Admitting: Family Medicine

## 2019-01-25 DIAGNOSIS — G4733 Obstructive sleep apnea (adult) (pediatric): Secondary | ICD-10-CM | POA: Diagnosis not present

## 2019-02-18 ENCOUNTER — Encounter: Payer: Self-pay | Admitting: Family Medicine

## 2019-03-10 ENCOUNTER — Encounter: Payer: Self-pay | Admitting: Gastroenterology

## 2019-03-12 ENCOUNTER — Encounter: Payer: Self-pay | Admitting: Family Medicine

## 2019-03-12 ENCOUNTER — Other Ambulatory Visit: Payer: Self-pay

## 2019-03-22 DIAGNOSIS — E230 Hypopituitarism: Secondary | ICD-10-CM | POA: Diagnosis not present

## 2019-03-22 LAB — CBC AND DIFFERENTIAL
HCT: 50 (ref 41–53)
Hemoglobin: 16.9 (ref 13.5–17.5)
Platelets: 212 (ref 150–399)
WBC: 5.8

## 2019-03-22 LAB — HEPATIC FUNCTION PANEL
ALT: 24 (ref 10–40)
AST: 18 (ref 14–40)
Alkaline Phosphatase: 54 (ref 25–125)
Bilirubin, Direct: 0.1 (ref 0.01–0.4)
Bilirubin, Total: 0.7

## 2019-03-22 LAB — COMPREHENSIVE METABOLIC PANEL: Albumin: 4.4 (ref 3.5–5.0)

## 2019-03-22 LAB — CBC: RBC: 5.63 — AB (ref 3.87–5.11)

## 2019-03-29 DIAGNOSIS — Z5181 Encounter for therapeutic drug level monitoring: Secondary | ICD-10-CM | POA: Diagnosis not present

## 2019-03-29 DIAGNOSIS — E230 Hypopituitarism: Secondary | ICD-10-CM | POA: Diagnosis not present

## 2019-03-29 DIAGNOSIS — Z86711 Personal history of pulmonary embolism: Secondary | ICD-10-CM | POA: Diagnosis not present

## 2019-03-29 DIAGNOSIS — G4730 Sleep apnea, unspecified: Secondary | ICD-10-CM | POA: Diagnosis not present

## 2019-03-30 ENCOUNTER — Encounter: Payer: Self-pay | Admitting: Family Medicine

## 2019-03-30 LAB — TESTOSTERONE: Testosterone: 323.9

## 2019-04-09 ENCOUNTER — Ambulatory Visit (INDEPENDENT_AMBULATORY_CARE_PROVIDER_SITE_OTHER): Payer: BC Managed Care – PPO | Admitting: Gastroenterology

## 2019-04-09 ENCOUNTER — Encounter: Payer: Self-pay | Admitting: Gastroenterology

## 2019-04-09 ENCOUNTER — Telehealth: Payer: Self-pay

## 2019-04-09 ENCOUNTER — Other Ambulatory Visit: Payer: Self-pay

## 2019-04-09 VITALS — BP 134/88 | HR 76 | Temp 98.4°F | Ht 67.75 in | Wt 234.4 lb

## 2019-04-09 DIAGNOSIS — Z7901 Long term (current) use of anticoagulants: Secondary | ICD-10-CM

## 2019-04-09 DIAGNOSIS — K921 Melena: Secondary | ICD-10-CM

## 2019-04-09 MED ORDER — NA SULFATE-K SULFATE-MG SULF 17.5-3.13-1.6 GM/177ML PO SOLN: 1.0000 | Freq: Once | ORAL | 0 refills | Status: AC

## 2019-04-09 NOTE — Telephone Encounter (Signed)
Left a message for patient to return my call. 

## 2019-04-09 NOTE — Telephone Encounter (Signed)
   [name] 09-24-1965 [id number]  Dear Dr. Yong Channel:  We have scheduled the above named patient for a(n) Colonoscopy procedure. Our records show that (s)he is on anticoagulation therapy.  Please advise as to whether the patient may come off their therapy of Xarelto 2 days prior to their procedure which is scheduled for 04/21/19.  Please route your response to Marlon Pel, CMA.   Sincerely,    [facility]

## 2019-04-09 NOTE — Telephone Encounter (Signed)
Informed patient he can hold Xarelto 2 days prior to his procedure per Dr. Yong Channel. Patient verbalized understanding.

## 2019-04-09 NOTE — Telephone Encounter (Signed)
Yes thanks-he may hold Coumadin for 2 days prior to procedure

## 2019-04-09 NOTE — Progress Notes (Signed)
History of Present Illness: This is a 54 year old male referred by Marin Olp, MD for the evaluation of rectal bleeding.  He was diagnosed with ulcerative proctosigmoiditis in 1998 and has not been seen in our office in over 10 years.  He states he came off all UC medications and has had only rare episodes of mild rectal bleeding and diarrhea over the years.  For the past few years he has not had any diarrhea.  He had several days of bright red blood per rectum with bowel movements about 1 month ago he used Preparation H suppositories daily for several days and the bleeding abated.  No other gastrointestinal complaints.  He is maintained on Xarelto for history of DVT and PE. Denies weight loss, abdominal pain, constipation, diarrhea, change in stool caliber, melena, nausea, vomiting, dysphagia, reflux symptoms, chest pain.     Allergies  Allergen Reactions  . Flagyl [Metronidazole] Other (See Comments)    REACTION: Fatigue   Outpatient Medications Prior to Visit  Medication Sig Dispense Refill  . diclofenac sodium (VOLTAREN) 1 % GEL Apply 2 g topically 4 (four) times daily. (Patient taking differently: Apply 2 g topically 2 (two) times daily as needed (PAIN). ) 100 g 1  . rivaroxaban (XARELTO) 20 MG TABS tablet Take 1 tablet (20 mg total) by mouth daily. 90 tablet 3  . sertraline (ZOLOFT) 25 MG tablet Take 1 tablet (25 mg total) by mouth daily. 90 tablet 3  . tadalafil (CIALIS) 20 MG tablet Take 0.5-1 tablets (10-20 mg total) by mouth every other day as needed for erectile dysfunction. 10 tablet 11  . testosterone (ANDROGEL) 50 MG/5GM (1%) GEL APPLY 1-1/2 PACKETS ONCE DAILY on shoulder area 300 g 2   No facility-administered medications prior to visit.   Past Medical History:  Diagnosis Date  . Anal fissure   . Anxiety   . Depression   . DVT (deep venous thrombosis) ([place])   . Hypertension, essential 12/05/2017  . Hypogonadism male   . Multinodular goiter   . Pulmonary  embolism ([place])   . Sleep apnea with use of continuous positive airway pressure (CPAP)   . Ulcerative colitis, unspecified    Past Surgical History:  Procedure Laterality Date  . MANDIBULAR HARDWARE REMOVAL Bilateral 07/10/2012   Procedure: BILATERAL MANDIBULAR HARDWARE REMOVAL;  Surgeon: Jerrell Belfast, MD;  Location: [place];  Service: ENT;  Laterality: Bilateral;  . ORIF MANDIBULAR FRACTURE Left 05/09/2012   Procedure: OPEN REDUCTION INTERNAL FIXATION (ORIF) RIGHT ANTERIOR MANDIBULAR FRACTURE; Open Reduction Internal Fixation of Midface fracture; Reduction of septal fracture; arch bars; Repair of lacerations;  Surgeon: Jerrell Belfast, MD;  Location: [place];  Service: ENT;  Laterality: Left;  . TRACHEOSTOMY TUBE PLACEMENT N/A 05/09/2012   Procedure: TRACHEOSTOMY;  Surgeon: Jerrell Belfast, MD;  Location: [place];  Service: ENT;  Laterality: N/A;   Social History   Socioeconomic History  . Marital status: Divorced    Spouse name: Not on file  . Number of children: 1  . Years of education: Not on file  . Highest education level: Not on file  Occupational History  . Occupation: Estate agent)  Tobacco Use  . Smoking status: Never Smoker  . Smokeless tobacco: Never Used  Substance and Sexual Activity  . Alcohol use: Yes    Comment: rarely  . Drug use: No  . Sexual activity: Yes  Other Topics Concern  . 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    Social Determinants of Health   Financial Resource Strain:   . Difficulty of Paying Living Expenses: Not on file  Food Insecurity:   . Worried About Charity fundraiser in the Last Year: Not on file  . Ran Out of Food in the Last Year: Not on file  Transportation Needs:   . Lack of Transportation (Medical): Not on file  . Lack of Transportation (Non-Medical): Not on file  Physical Activity:   . Days  of Exercise per Week: Not on file  . Minutes of Exercise per Session: Not on file  Stress:   . Feeling of Stress : Not on file  Social Connections:   . Frequency of Communication with Friends and Family: Not on file  . Frequency of Social Gatherings with Friends and Family: Not on file  . Attends Religious Services: Not on file  . Active Member of Clubs or Organizations: Not on file  . Attends Archivist Meetings: Not on file  . Marital Status: Not on file   Family History  Problem Relation Age of Onset  . COPD Father   . Arthritis Mother   . Irritable bowel syndrome Mother   . Healthy Brother   . Healthy Brother   . Lung cancer Paternal Grandmother      Review of Systems: Pertinent positive and negative review of systems were noted in the above HPI section. All other review of systems were otherwise negative.   Physical Exam: General: Well developed, well nourished, no acute distress Head: Normocephalic and atraumatic Eyes:  sclerae anicteric, EOMI Ears: Normal auditory acuity Mouth: No deformity or lesions Neck: Supple, no masses or thyromegaly Lungs: Clear throughout to auscultation Heart: Regular rate and rhythm; no murmurs, rubs or bruits Abdomen: Soft, non tender and non distended. No masses, hepatosplenomegaly or hernias noted. Normal Bowel sounds Rectal: deferred to colonoscopy Musculoskeletal: Symmetrical with no gross deformities  Skin: No lesions on visible extremities Pulses:  Normal pulses noted Extremities: No clubbing, cyanosis, edema or deformities noted Neurological: Alert oriented x 4, grossly nonfocal Cervical Nodes:  No significant cervical adenopathy Inguinal Nodes: No significant inguinal adenopathy Psychological:  Alert and cooperative. Normal mood and affect   Assessment and Recommendations:  1. Hematochezia, history of ulcerative proctosigmoiditis.  Rule out colorectal neoplasms, IBD, hemorrhoids.  Schedule colonoscopy. The risks  (including bleeding, perforation, infection, missed lesions, medication reactions and possible hospitalization or surgery if complications occur), benefits, and alternatives to colonoscopy with possible biopsy and possible polypectomy were discussed with the patient and they consent to proceed.   2.  History of DVT and PE maintained on Xarelto. Hold Xarelto 2 days before procedure - will instruct when and how to resume after procedure. Low but real risk of cardiovascular event such as heart attack, stroke, embolism, thrombosis or ischemia/infarct of other organs off Xarelto explained and need to seek urgent help if this occurs. The patient consents to proceed. Will communicate by phone or EMR with patient's prescribing provider to confirm that holding Xarelto is reasonable in this case.     cc: Marin Olp, [address],  [place] [id number]

## 2019-04-09 NOTE — Patient Instructions (Signed)
You have been scheduled for a colonoscopy. Please follow written instructions given to you at your visit today.  Please pick up your prep supplies at the pharmacy within the next 1-3 days. If you use inhalers (even only as needed), please bring them with you on the day of your procedure.   Thank you for choosing me and [facility].  Pricilla Riffle. Dagoberto Ligas., MD., Marval Regal

## 2019-04-19 ENCOUNTER — Encounter: Payer: Self-pay | Admitting: Gastroenterology

## 2019-04-20 ENCOUNTER — Telehealth: Payer: Self-pay

## 2019-04-20 NOTE — Telephone Encounter (Signed)
error 

## 2019-04-21 ENCOUNTER — Encounter: Payer: BC Managed Care – PPO | Admitting: Gastroenterology

## 2019-05-11 ENCOUNTER — Encounter: Payer: BC Managed Care – PPO | Admitting: Gastroenterology

## 2019-05-19 ENCOUNTER — Ambulatory Visit (INDEPENDENT_AMBULATORY_CARE_PROVIDER_SITE_OTHER): Payer: BC Managed Care – PPO

## 2019-05-19 ENCOUNTER — Other Ambulatory Visit: Payer: Self-pay | Admitting: Gastroenterology

## 2019-05-19 DIAGNOSIS — Z1159 Encounter for screening for other viral diseases: Secondary | ICD-10-CM | POA: Diagnosis not present

## 2019-05-19 LAB — SARS CORONAVIRUS 2 (TAT 6-24 HRS): SARS Coronavirus 2: NEGATIVE

## 2019-05-21 ENCOUNTER — Encounter: Payer: Self-pay | Admitting: Gastroenterology

## 2019-05-21 ENCOUNTER — Ambulatory Visit (AMBULATORY_SURGERY_CENTER): Payer: BC Managed Care – PPO | Admitting: Gastroenterology

## 2019-05-21 ENCOUNTER — Other Ambulatory Visit: Payer: Self-pay

## 2019-05-21 VITALS — BP 117/53 | HR 60 | Temp 96.0°F | Resp 18 | Ht 68.0 in | Wt 234.0 lb

## 2019-05-21 DIAGNOSIS — K51311 Ulcerative (chronic) rectosigmoiditis with rectal bleeding: Secondary | ICD-10-CM | POA: Diagnosis not present

## 2019-05-21 DIAGNOSIS — D123 Benign neoplasm of transverse colon: Secondary | ICD-10-CM

## 2019-05-21 DIAGNOSIS — K921 Melena: Secondary | ICD-10-CM

## 2019-05-21 DIAGNOSIS — K529 Noninfective gastroenteritis and colitis, unspecified: Secondary | ICD-10-CM | POA: Diagnosis not present

## 2019-05-21 MED ORDER — SODIUM CHLORIDE 0.9 % IV SOLN: 500.0000 mL | Freq: Once | INTRAVENOUS | Status: DC

## 2019-05-21 NOTE — Progress Notes (Signed)
Report to PACU, RN, vss, BBS= Clear.  

## 2019-05-21 NOTE — Patient Instructions (Signed)
Information on polyps given to you today.  Await pathology results.  Resume Xarelto in 2 days at prior dose.  Avoid NSAIDS (Aspirin, Ibuprofen, Aleve, Naproxen),for 2 weeks after polyp removal.  you may use Tylenol as needed.  Return to GI office in 6 weeks.  YOU HAD AN ENDOSCOPIC PROCEDURE TODAY AT [facility]:   Refer to the procedure report that was given to you for any specific questions about what was found during the examination.  If the procedure report does not answer your questions, please call your gastroenterologist to clarify.  If you requested that your care partner not be given the details of your procedure findings, then the procedure report has been included in a sealed envelope for you to review at your convenience later.  YOU SHOULD EXPECT: Some feelings of bloating in the abdomen. Passage of more gas than usual.  Walking can help get rid of the air that was put into your GI tract during the procedure and reduce the bloating. If you had a lower endoscopy (such as a colonoscopy or flexible sigmoidoscopy) you may notice spotting of blood in your stool or on the toilet paper. If you underwent a bowel prep for your procedure, you may not have a normal bowel movement for a few days.  Please Note:  You might notice some irritation and congestion in your nose or some drainage.  This is from the oxygen used during your procedure.  There is no need for concern and it should clear up in a day or so.  SYMPTOMS TO REPORT IMMEDIATELY:   Following lower endoscopy (colonoscopy or flexible sigmoidoscopy):  Excessive amounts of blood in the stool  Significant tenderness or worsening of abdominal pains  Swelling of the abdomen that is new, acute  Fever of 100F or higher   For urgent or emergent issues, a gastroenterologist can be reached at any hour by calling [phone]. Do not use MyChart messaging for urgent concerns.    DIET:  We do recommend a small meal at  first, but then you may proceed to your regular diet.  Drink plenty of fluids but you should avoid alcoholic beverages for 24 hours.  ACTIVITY:  You should plan to take it easy for the rest of today and you should NOT DRIVE or use heavy machinery until tomorrow (because of the sedation medicines used during the test).    FOLLOW UP: Our staff will call the number listed on your records 48-72 hours following your procedure to check on you and address any questions or concerns that you may have regarding the information given to you following your procedure. If we do not reach you, we will leave a message.  We will attempt to reach you two times.  During this call, we will ask if you have developed any symptoms of COVID 19. If you develop any symptoms (ie: fever, flu-like symptoms, shortness of breath, cough etc.) before then, please call [phone].  If you test positive for Covid 19 in the 2 weeks post procedure, please call and report this information to [place].    If any biopsies were taken you will be contacted by phone or by letter within the next 1-3 weeks.  Please call us at [phone] if you have not heard about the biopsies in 3 weeks.    SIGNATURES/CONFIDENTIALITY: You and/or your care partner have signed paperwork which will be entered into your electronic medical record.  These signatures attest to the fact that that  the information above on your After Visit Summary has been reviewed and is understood.  Full responsibility of the confidentiality of this discharge information lies with you and/or your care-partner.

## 2019-05-21 NOTE — Progress Notes (Signed)
Vitals-CW Temp-LC  Pt's states no medical or surgical changes since previsit or office visit.  CRNA Josh Monday was notified that the patent drank a cup of water at 0930. He stated that "that's fine."

## 2019-05-21 NOTE — Progress Notes (Signed)
Called to room to assist during endoscopic procedure.  Patient ID and intended procedure confirmed with present staff. Received instructions for my participation in the procedure from the performing physician.  

## 2019-05-21 NOTE — Op Note (Signed)
[place] Patient Name: [name] Procedure Date: 05/21/2019 10:50 AM MRN: [id number] Endoscopist: Ladene Artist , MD Age: 54 Referring MD:  Date of Birth: [birth year] Gender: Male Account #: [id number] Procedure:                Colonoscopy Indications:              Hematochezia. History of proctosigmoiditis. Medicines:                Monitored Anesthesia Care Procedure:                Pre-Anesthesia Assessment:                           - Prior to the procedure, a History and Physical                            was performed, and patient medications and                            allergies were reviewed. The patient's tolerance of                            previous anesthesia was also reviewed. The risks                            and benefits of the procedure and the sedation                            options and risks were discussed with the patient.                            All questions were answered, and informed consent                            was obtained. Prior Anticoagulants: The patient has                            taken Xarelto (rivaroxaban), last dose was 2 days                            prior to procedure. ASA Grade Assessment: II - A                            patient with mild systemic disease. After reviewing                            the risks and benefits, the patient was deemed in                            satisfactory condition to undergo the procedure.                           After obtaining informed consent, the colonoscope  was passed under direct vision. Throughout the                            procedure, the patient's blood pressure, pulse, and                            oxygen saturations were monitored continuously. The                            Colonoscope was introduced through the anus and                            advanced to the the cecum, identified by                            appendiceal  orifice and ileocecal valve. The                            ileocecal valve, appendiceal orifice, and rectum                            were photographed. The quality of the bowel                            preparation was excellent. The colonoscopy was                            performed without difficulty. The patient tolerated                            the procedure well. Scope In: 11:03:47 AM Scope Out: 11:22:48 AM Scope Withdrawal Time: 0 hours 16 minutes 20 seconds  Total Procedure Duration: 0 hours 19 minutes 1 second  Findings:                 The perianal and digital rectal examinations were                            normal.                           A 7 mm polyp was found in the transverse colon. The                            polyp was sessile. The polyp was removed with a                            cold snare. Resection and retrieval were complete.                           A 10 mm polyp was found in the transverse colon.                            The polyp was sessile. The polyp was removed with a  hot snare. Resection and retrieval were complete.                           Inflammation characterized by erythema, friability,                            granularity and loss of vascularity was found in a                            continuous and circumferential pattern from the                            rectum to the sigmoid colon at 25 cm. The                            descending colon, the splenic flexure, the                            transverse colon, the hepatic flexure, the                            ascending colon, the cecum, the appendiceal orifice                            and the ileocecal valve were spared. This was                            moderate in severity. Biopsies were taken with a                            cold forceps for histology throughout the colon.                           The exam was otherwise without abnormality  on                            direct and retroflexion views. Complications:            No immediate complications. Estimated blood loss:                            None. Estimated Blood Loss:     Estimated blood loss: none. Impression:               - One 7 mm polyp in the transverse colon, removed                            with a cold snare. Resected and retrieved.                           - One 10 mm polyp in the transverse colon, removed                            with a hot snare. Resected and retrieved.                           -  Proctosigmoid colitis. Inflammation was found                            from the rectum to the sigmoid colon. This was                            moderate in severity. Biopsied.                           - The examination was otherwise normal on direct                            and retroflexion views. Biopsied. Recommendation:           - Repeat colonoscopy after studies are complete for                            surveillance based on pathology results.                           - Resume Xarelto (rivaroxaban) in 2 days at prior                            dose. Refer to managing physician for further                            adjustment of therapy.                           - Patient has a contact number available for                            emergencies. The signs and symptoms of potential                            delayed complications were discussed with the                            patient. Return to normal activities tomorrow.                            Written discharge instructions were provided to the                            patient.                           - Resume previous diet.                           - Continue present medications.                           - Await pathology results.                           -  No aspirin, ibuprofen, naproxen, or other                            non-steroidal anti-inflammatory drugs for 2 weeks                             after polyp removal.                           - Return to GI office in 6 weeks. Ladene Artist, MD 05/21/2019 11:28:17 AM This report has been signed electronically.

## 2019-05-25 ENCOUNTER — Telehealth: Payer: Self-pay | Admitting: *Deleted

## 2019-05-25 ENCOUNTER — Telehealth: Payer: Self-pay

## 2019-05-25 NOTE — Telephone Encounter (Signed)
Left message on follow up call. 

## 2019-05-25 NOTE — Telephone Encounter (Signed)
Attempted second f/u phone call. No answer. Left message.

## 2019-05-26 ENCOUNTER — Encounter: Payer: Self-pay | Admitting: Family Medicine

## 2019-06-02 ENCOUNTER — Other Ambulatory Visit: Payer: Self-pay

## 2019-06-02 ENCOUNTER — Telehealth: Payer: Self-pay | Admitting: Gastroenterology

## 2019-06-02 MED ORDER — MESALAMINE 1.2 G PO TBEC: 2.4000 g | DELAYED_RELEASE_TABLET | Freq: Every day | ORAL | 11 refills | Status: DC

## 2019-06-02 NOTE — Telephone Encounter (Signed)
Patient is returning your call.  

## 2019-06-03 NOTE — Telephone Encounter (Signed)
See results for additional details.

## 2019-06-26 NOTE — Progress Notes (Signed)
Phone [phone] In person visit   Subjective:   [name] is a 54 y.o. year old very pleasant male patient who presents for/with See problem oriented charting Chief Complaint  Patient presents with  . Depression   This visit occurred during the SARS-CoV-2 public health emergency.  Safety protocols were in place, including screening questions prior to the visit, additional usage of staff PPE, and extensive cleaning of exam room while observing appropriate contact time as indicated for disinfecting solutions.   Past Medical History-  Patient Active Problem List   Diagnosis Date Noted  . Memory difficulties 03/10/2017    Priority: High  . Hypercoagulable state ([place]) 04/04/2015    Priority: High  . Thyroid nodule 02/01/2014    Priority: High  . Chronic pulmonary embolism ([place])- see discharge summary 01/2014. ? related to testosterone therapy 01/31/2014    Priority: High  . Ulcerative colitis ([place]) 03/04/1992    Priority: High  . Hypertension, essential 12/05/2017    Priority: Medium  . Obstructive sleep apnea 07/03/2016    Priority: Medium  . History of DVT (deep vein thrombosis) 02/10/2014    Priority: Medium  . Hypogonadism in male 01/15/2007    Priority: Medium  . Major depression in full remission ([place]) 01/15/2007    Priority: Medium  . Hyperlipemia 01/09/2007    Priority: Medium  . 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 Depression, major, single episode, in partial remission ([place]) 01/15/2007    Medications- reviewed and updated Current Outpatient Medications  Medication Sig Dispense Refill  . diclofenac sodium (VOLTAREN) 1 % GEL Apply 2 g topically 4 (four) times daily. (Patient taking differently: Apply 2 g topically 2 (two) times daily as needed (PAIN). ) 100 g 1  . mesalamine (LIALDA) 1.2 g EC tablet Take 2 tablets (2.4 g total) by mouth daily with breakfast. 60  tablet 11  . rivaroxaban (XARELTO) 20 MG TABS tablet Take 1 tablet (20 mg total) by mouth daily. 90 tablet 3  . sertraline (ZOLOFT) 25 MG tablet Take 1 tablet (25 mg total) by mouth daily. 90 tablet 3  . tadalafil (CIALIS) 20 MG tablet Take 0.5-1 tablets (10-20 mg total) by mouth every other day as needed for erectile dysfunction. 10 tablet 11  . testosterone (ANDROGEL) 50 MG/5GM (1%) GEL APPLY 1-1/2 PACKETS ONCE DAILY on shoulder area 300 g 2   No current facility-administered medications for this visit.     Objective:  BP 136/74   Pulse 72   Temp 98 F (36.7 C) (Temporal)   Ht 5' 8"  (1.727 m)   Wt 230 lb (104.3 kg)   SpO2 97%   BMI 34.97 kg/m  Gen: NAD, resting comfortably CV: RRR no murmurs rubs or gallops Lungs: CTAB no crackles, wheeze, rhonchi Ext: no edema, no calf pain     Assessment and Plan   # Depression S: Medication: zoloft 20m. Feels like this is controlling symptoms.  [place] high level of work stress.  [facility] 06/28/2019 12/28/2018 07/21/2018  Decreased Interest 0 0 0  Down, Depressed, Hopeless 0 0 1  PHQ - 2 Score 0 0 1  Altered sleeping 0 0 3  Tired, decreased energy 0 0 0  Change in appetite 0 0 0  Feeling bad or failure about yourself  0 0 0  Trouble concentrating 0 0 1  Moving slowly or  fidgety/restless 0 0 0  Suicidal thoughts 0 0 0  PHQ-9 Score 0 0 5  Difficult doing work/chores Not difficult at all Not difficult at all Somewhat difficult  A/P: Excellent control on Zoloft 25 mg with reasonable side effect profile-less libido issues than 44m but cialis helps him.  PHQ-9 score of 0 today-continue current medication - going from here to get in a 5 mile hike - has enjoyed some good trips to [place]- trying to do more vacations in general  # Hypercoagulable State/Chronic PE S:currently on Xalreto 284m Left leg DVT 02/01/14 and also with PE and then superficial clot in left GSV but with potential to extend to distal system noted  01/06/15 so now on lifelong therapy A/P: no chest pain or shortness of breath on xarelto. This is adequately treating both his long-term hypercoagulable state and chronic PE  #Elevated PSA in patient with hypogonadism/low Testosterone S: Elevated PSA last visit.  Plan was for patient to return in 1 to 2 months but does not look like that was completed.  He is on testosterone therapy which can increase risk of prostate cancer. Lab Results  Component Value Date   PSA 2.65 12/28/2018   PSA 1.12 05/10/2013  A/P: Hopefully PSA is not trending up further-if stable I am okay with yearly checks.  If increases will need to get endocrines opinion on if testosterone related and if we need to decrease dose or get urology referral.    #hypertension S: medication: none Home readings #s: Monitoring with wrist cuff at home and mainly getting 130s (118-145 with average in 130s)/70s. Pretty consistent with what we are getting today BP Readings from Last 3 Encounters:  06/28/19 136/74  05/21/19 (!) 117/53  04/09/19 134/88  A/P: controlled with diet/exercise- down almost 10 lbs from 2 Decembers ago.   # Ulcerative Colitis- doing well on Lialda . He doesn't note a significant difference. Repeat colonoscopy 05/2021- also had some polyps with adenomas- he feels like work stress may have triggered this  # OSA- consistent with CPAP- encouraged him to continue  Recommended follow up: Return for CPE after 12/29/2019. Future Appointments  Date Time Provider [place]5/17/2021 11:10 AM StLadene ArtistMD [facility]10/25/2021  9:00 AM YoDeneise LeverMD LBPU-PULCARE None    Lab/Order associations:   ICD-10-CM   1. Depression, major, single episode, in partial remission ([place] [facility] F32.4   2. Hypercoagulable state ([facility] [place] D68.59 CBC with Differential/Platelet    Comprehensive metabolic panel  3. Hypogonadism in male  E29.1   4. Elevated PSA  R97.20 PSA   Return precautions advised.  StGarret ReddishMD

## 2019-06-28 ENCOUNTER — Encounter: Payer: Self-pay | Admitting: Family Medicine

## 2019-06-28 ENCOUNTER — Ambulatory Visit (INDEPENDENT_AMBULATORY_CARE_PROVIDER_SITE_OTHER): Payer: BC Managed Care – PPO | Admitting: Family Medicine

## 2019-06-28 ENCOUNTER — Other Ambulatory Visit: Payer: Self-pay

## 2019-06-28 VITALS — BP 136/74 | HR 72 | Temp 98.0°F | Ht 68.0 in | Wt 230.0 lb

## 2019-06-28 DIAGNOSIS — F324 Major depressive disorder, single episode, in partial remission: Secondary | ICD-10-CM | POA: Diagnosis not present

## 2019-06-28 DIAGNOSIS — E291 Testicular hypofunction: Secondary | ICD-10-CM | POA: Diagnosis not present

## 2019-06-28 DIAGNOSIS — D6859 Other primary thrombophilia: Secondary | ICD-10-CM

## 2019-06-28 DIAGNOSIS — R972 Elevated prostate specific antigen [PSA]: Secondary | ICD-10-CM | POA: Diagnosis not present

## 2019-06-28 DIAGNOSIS — I10 Essential (primary) hypertension: Secondary | ICD-10-CM

## 2019-06-28 LAB — CBC WITH DIFFERENTIAL/PLATELET
Basophils Absolute: 0 10*3/uL (ref 0.0–0.1)
Basophils Relative: 0.8 % (ref 0.0–3.0)
Eosinophils Absolute: 0.2 10*3/uL (ref 0.0–0.7)
Eosinophils Relative: 3.4 % (ref 0.0–5.0)
HCT: 47.3 % (ref 39.0–52.0)
Hemoglobin: 16.5 g/dL (ref 13.0–17.0)
Lymphocytes Relative: 40.8 % (ref 12.0–46.0)
Lymphs Abs: 2.2 10*3/uL (ref 0.7–4.0)
MCHC: 34.8 g/dL (ref 30.0–36.0)
MCV: 88.7 fl (ref 78.0–100.0)
Monocytes Absolute: 0.7 10*3/uL (ref 0.1–1.0)
Monocytes Relative: 14.1 % — ABNORMAL HIGH (ref 3.0–12.0)
Neutro Abs: 2.2 10*3/uL (ref 1.4–7.7)
Neutrophils Relative %: 40.9 % — ABNORMAL LOW (ref 43.0–77.0)
Platelets: 201 10*3/uL (ref 150.0–400.0)
RBC: 5.33 Mil/uL (ref 4.22–5.81)
RDW: 14.2 % (ref 11.5–15.5)
WBC: 5.3 10*3/uL (ref 4.0–10.5)

## 2019-06-28 LAB — COMPREHENSIVE METABOLIC PANEL
ALT: 23 U/L (ref 0–53)
AST: 17 U/L (ref 0–37)
Albumin: 4.3 g/dL (ref 3.5–5.2)
Alkaline Phosphatase: 69 U/L (ref 39–117)
BUN: 15 mg/dL (ref 6–23)
CO2: 28 mEq/L (ref 19–32)
Calcium: 9.5 mg/dL (ref 8.4–10.5)
Chloride: 101 mEq/L (ref 96–112)
Creatinine, Ser: 0.99 mg/dL (ref 0.40–1.50)
GFR: 79.56 mL/min (ref >60.00)
Glucose, Bld: 81 mg/dL (ref 70–99)
Potassium: 4.1 mEq/L (ref 3.5–5.1)
Sodium: 138 mEq/L (ref 135–145)
Total Bilirubin: 0.7 mg/dL (ref 0.2–1.2)
Total Protein: 7.1 g/dL (ref 6.0–8.3)

## 2019-06-28 LAB — PSA: PSA: 1.62 ng/mL (ref 0.10–4.00)

## 2019-06-28 NOTE — Patient Instructions (Addendum)
No changes today   Please stop by lab before you go If you have mychart- we will send your results within 3 business days of [place] receiving them.  If you do not have mychart- we will call you about results within 5 business days of [place] receiving them.    Recommended follow up: Return for CPE after 12/29/2019.

## 2019-07-19 ENCOUNTER — Ambulatory Visit: Payer: BC Managed Care – PPO | Admitting: Gastroenterology

## 2019-10-14 DIAGNOSIS — Z1152 Encounter for screening for COVID-19: Secondary | ICD-10-CM | POA: Diagnosis not present

## 2019-10-25 DIAGNOSIS — G4733 Obstructive sleep apnea (adult) (pediatric): Secondary | ICD-10-CM | POA: Diagnosis not present

## 2019-10-28 DIAGNOSIS — Z79899 Other long term (current) drug therapy: Secondary | ICD-10-CM | POA: Diagnosis not present

## 2019-10-28 DIAGNOSIS — E230 Hypopituitarism: Secondary | ICD-10-CM | POA: Diagnosis not present

## 2019-10-28 DIAGNOSIS — Z125 Encounter for screening for malignant neoplasm of prostate: Secondary | ICD-10-CM | POA: Diagnosis not present

## 2019-10-28 DIAGNOSIS — Z13220 Encounter for screening for lipoid disorders: Secondary | ICD-10-CM | POA: Diagnosis not present

## 2019-11-01 DIAGNOSIS — Z125 Encounter for screening for malignant neoplasm of prostate: Secondary | ICD-10-CM | POA: Diagnosis not present

## 2019-11-01 DIAGNOSIS — E230 Hypopituitarism: Secondary | ICD-10-CM | POA: Diagnosis not present

## 2019-11-01 DIAGNOSIS — Z86711 Personal history of pulmonary embolism: Secondary | ICD-10-CM | POA: Diagnosis not present

## 2019-11-01 DIAGNOSIS — G4730 Sleep apnea, unspecified: Secondary | ICD-10-CM | POA: Diagnosis not present

## 2019-11-09 ENCOUNTER — Other Ambulatory Visit: Payer: Self-pay | Admitting: Family Medicine

## 2019-11-22 DIAGNOSIS — G4733 Obstructive sleep apnea (adult) (pediatric): Secondary | ICD-10-CM | POA: Diagnosis not present

## 2019-12-16 NOTE — Patient Instructions (Addendum)
Health Maintenance Due  Topic Date Due  . Hepatitis C Screening - consider at physcal Never done  . INFLUENZA VACCINE Declined in office flu shot today. 10/03/2019   You had a biopsy today- leave bandage in place for 24 hours. May then remove and wash with soapy water but not scrub. After bathing, reapply triple antibiotic ointment and bandaid. Since in armpit can do this twice a day after first 24 hours. If it bleeds- apply pressure up to 30 minutes to an hour- seek care if continues to bleed past that point. If you have expanding redness or worsening pain then let us reevaluate the spot. Avoid deodorant in the area  We have ordered labs or studies at this visit (pathology of lesion). It can take up to 1-2 weeks for results and processing. IF results require follow up or explanation, we will call you with instructions. Clinically stable results will be released to your [facility]. If you have not heard from [place] or cannot find your results in [facility] in 2 weeks please contact our office at [phone].

## 2019-12-16 NOTE — Progress Notes (Signed)
Phone [phone] In person visit   Subjective:   [name] is a 54 y.o. year old very pleasant male patient who presents for/with See problem oriented charting Chief Complaint  Patient presents with  . Plantar Warts   This visit occurred during the SARS-CoV-2 public health emergency.  Safety protocols were in place, including screening questions prior to the visit, additional usage of staff PPE, and extensive cleaning of exam room while observing appropriate contact time as indicated for disinfecting solutions.   Past Medical History-  Patient Active Problem List   Diagnosis Date Noted  . Memory difficulties 03/10/2017    Priority: High  . Hypercoagulable state ([place]) 04/04/2015    Priority: High  . Thyroid nodule 02/01/2014    Priority: High  . Chronic pulmonary embolism ([place])- see discharge summary 01/2014. ? related to testosterone therapy 01/31/2014    Priority: High  . Ulcerative colitis ([place]) 03/04/1992    Priority: High  . Hypertension, essential 12/05/2017    Priority: Medium  . Obstructive sleep apnea 07/03/2016    Priority: Medium  . History of DVT (deep vein thrombosis) 02/10/2014    Priority: Medium  . Hypogonadism in male 01/15/2007    Priority: Medium  . Major depression in full remission ([place]) 01/15/2007    Priority: Medium  . Hyperlipemia 01/09/2007    Priority: Medium  . 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 Depression, major, single episode, in partial remission ([place]) 01/15/2007    Medications- reviewed and updated Current Outpatient Medications  Medication Sig Dispense Refill  . diclofenac sodium (VOLTAREN) 1 % GEL Apply 2 g topically 4 (four) times daily. (Patient taking differently: Apply 2 g topically 2 (two) times daily as needed (PAIN). ) 100 g 1  . rivaroxaban (XARELTO) 20 MG TABS tablet Take 1 tablet (20 mg total) by mouth daily. 90 tablet 3    . sertraline (ZOLOFT) 25 MG tablet TAKE 1 TABLET BY MOUTH EVERY DAY 90 tablet 3  . tadalafil (CIALIS) 20 MG tablet Take 0.5-1 tablets (10-20 mg total) by mouth every other day as needed for erectile dysfunction. 10 tablet 11  . testosterone (ANDROGEL) 50 MG/5GM (1%) GEL APPLY 1-1/2 PACKETS ONCE DAILY on shoulder area 300 g 2  . mesalamine (LIALDA) 1.2 g EC tablet Take 2 tablets (2.4 g total) by mouth daily with breakfast. 60 tablet 11   No current facility-administered medications for this visit.     Objective:  BP 126/80   Pulse 61   Temp 98.6 F (37 C) (Temporal)   Resp 18   Ht 5' 8"  (1.727 m)   Wt 233 lb 9.6 oz (106 kg)   SpO2 98%   BMI 35.52 kg/m  Gen: NAD, resting comfortably CV: RRR no murmurs rubs or gallops Lungs: CTAB no crackles, wheeze, rhonchi Abdomen: soft/nontender/nondistended/normal bowel sounds. No rebound or guarding.  Ext: no edema Skin: warm, dry, on bottom of right foot has 1 cm area rather firm possible callus- this was pared down and appeared to have a hyperdense white area that was removed- suspect corn.   Also in left axilla 2 x 1 cm dark area including base of 2 x 1 cm that extended out almost a centimeter as well  Skin Biopsy Procedure Note   PRE-OP DIAGNOSIS:  neoplasm of uncertain behavior POST-OP DIAGNOSIS: Same  Size of lesion: 2 x 1 cm base  Location of lesion left axilla PROCEDURE:  x  Shave Biopsy         We discussed risks of procedure and obtained verbal consent . The area surrounding the skin lesion was prepared and draped in the usual sterile manner. The lesion was removed in the usual manner by the biopsy method noted above. Hemostasis was assured. The patient tolerated the procedure well.  Closure:    drysol  Followup: The patient tolerated the procedure well without complications.  Standard post-procedure care is explained and return precautions are given.     Assessment and Plan  # Right foot pain S:Patient mentioned that he  noticed that wart on his right fot 4 weeks ago. Some pain when he's walking. He has taken otc ibuprofen for the pain. Dad had plantar warts and his seem similar. Certain shoes or barefoot may bother him more A/P: Right foot pain - appears to have a corn- this was pared down and he had immediate improvement/resolution of pain. He had been concerned about wart but I did not see evidence of this.  -Hypercoagulable state-attempted to pare area down but with caution due to Xarelto (though states typically doesn't bleed heavily)   # Skin lesion S:Patient with skin tag in left axilla for 2 years. He has noted it increasing in size and darkening in color.   A/P: I suspect benign skin tag but with change in coloration- we opted to biopsy. He is very concerned about cancer.  Thankfully no significant bleeding issues despite being on xarelto/hypercoaguable state  # Depression S: Medication:Zoloft 25 mg Depression screen [facility] 2/9 12/20/2019 06/28/2019 12/28/2018  Decreased Interest 0 0 0  Down, Depressed, Hopeless 0 0 0  PHQ - 2 Score 0 0 0  Altered sleeping 0 0 0  Tired, decreased energy 0 0 0  Change in appetite 0 0 0  Feeling bad or failure about yourself  0 0 0  Trouble concentrating 0 0 0  Moving slowly or fidgety/restless 0 0 0  Suicidal thoughts 0 0 0  PHQ-9 Score 0 0 0  Difficult doing work/chores - Not difficult at all Not difficult at all   A/P: Stable/full remissionw ith PHQ9 of 0!  Continue current medications.    #hypertension S: medication: None.  Prior weight loss was helpful-has trended up 3 pounds BP Readings from Last 3 Encounters:  12/20/19 126/80  06/28/19 136/74  05/21/19 (!) 117/53  A/P: reasonable control without meds- continue current mediations   Recommended follow up: keep physical in february Future Appointments  Date Time Provider [place]  12/27/2019  9:00 AM Deneise Lever, MD LBPU-PULCARE None  04/20/2020  4:00 PM Marin Olp, MD [facility]     Lab/Order associations:   ICD-10-CM   1. Neoplasm of uncertain behavior  I32.5 Surgical pathology( [place]/ [facility])  2. Right foot pain  M79.671   3. Recurrent major depressive disorder, in full remission ([place])  F33.42   4. Hypertension, essential  I10   5. Hypercoagulable state ([place])  D68.59     Return precautions advised.  Garret Reddish, MD

## 2019-12-20 ENCOUNTER — Other Ambulatory Visit (HOSPITAL_COMMUNITY)
Admission: RE | Admit: 2019-12-20 | Discharge: 2019-12-20 | Disposition: A | Discharge status: Home / Self Care | Payer: BC Managed Care – PPO | Source: Ambulatory Visit | Attending: Family Medicine | Admitting: Family Medicine

## 2019-12-20 ENCOUNTER — Encounter: Payer: Self-pay | Admitting: Family Medicine

## 2019-12-20 ENCOUNTER — Ambulatory Visit (INDEPENDENT_AMBULATORY_CARE_PROVIDER_SITE_OTHER): Payer: BC Managed Care – PPO | Admitting: Family Medicine

## 2019-12-20 ENCOUNTER — Other Ambulatory Visit: Payer: Self-pay

## 2019-12-20 VITALS — BP 126/80 | HR 61 | Temp 98.6°F | Resp 18 | Ht 68.0 in | Wt 233.6 lb

## 2019-12-20 DIAGNOSIS — D6859 Other primary thrombophilia: Secondary | ICD-10-CM

## 2019-12-20 DIAGNOSIS — F3342 Major depressive disorder, recurrent, in full remission: Secondary | ICD-10-CM | POA: Diagnosis not present

## 2019-12-20 DIAGNOSIS — I10 Essential (primary) hypertension: Secondary | ICD-10-CM | POA: Diagnosis not present

## 2019-12-20 DIAGNOSIS — D489 Neoplasm of uncertain behavior, unspecified: Secondary | ICD-10-CM | POA: Insufficient documentation

## 2019-12-20 DIAGNOSIS — M79671 Pain in right foot: Secondary | ICD-10-CM

## 2019-12-20 DIAGNOSIS — L918 Other hypertrophic disorders of the skin: Secondary | ICD-10-CM

## 2019-12-20 DIAGNOSIS — D225 Melanocytic nevi of trunk: Secondary | ICD-10-CM | POA: Diagnosis not present

## 2019-12-22 LAB — SURGICAL PATHOLOGY

## 2019-12-27 ENCOUNTER — Other Ambulatory Visit: Payer: Self-pay

## 2019-12-27 ENCOUNTER — Ambulatory Visit: Payer: BC Managed Care – PPO | Admitting: Internal Medicine

## 2019-12-27 ENCOUNTER — Encounter: Payer: Self-pay | Admitting: Internal Medicine

## 2019-12-27 ENCOUNTER — Ambulatory Visit (INDEPENDENT_AMBULATORY_CARE_PROVIDER_SITE_OTHER): Payer: BC Managed Care – PPO | Admitting: Internal Medicine

## 2019-12-27 DIAGNOSIS — G4733 Obstructive sleep apnea (adult) (pediatric): Secondary | ICD-10-CM | POA: Diagnosis not present

## 2019-12-27 NOTE — Progress Notes (Deleted)
HPI male never smoker followed for OSA, complicated by history of facial fracture/trauma, history DVT/PE/ Xarelto, ulcerative colitis Unattended Home Sleep Test 07/17/16-AHI 40.7/hour, desaturation to 81%, body weight 221 pounds  -------------------------------------------------------------------------------  12/21/2018- 54 year old male never smoker followed for OSA, complicated by history of facial fracture/trauma, history DVT/PE/ Xarelto, ulcerative colitis  CPAP auto 5-20/[facility] -----OSA on CPAP 5-20, DME: [facility]; no complaints  Body weight today 224 lbs Download compliance 100%, AHI 1.4/ hr He states he is better off with CPAP.  Declines flu vax and states general health is good. Remains on Xarelto for VTE.  12/27/19- 54 year old male never smoker followed for OSA, complicated by history of facial fracture/trauma, history DVT/PE/ Xarelto, ulcerative colitis  CPAP auto 5-20/[facility] Body weight today- Download- Covid vax- Flu vax-   ROS-see HPI   + = positive Constitutional:    weight loss, night sweats, fevers, chills, +fatigue, lassitude. HEENT:    headaches, difficulty swallowing, tooth/dental problems, sore throat,       sneezing, itching, ear ache, nasal congestion, post nasal drip, snoring CV:    chest pain, orthopnea, PND, swelling in lower extremities, anasarca,                                                    dizziness, palpitations Resp:   shortness of breath with exertion or at rest.                productive cough,   non-productive cough, coughing up of blood.              change in color of mucus.  wheezing.   Skin:    rash or lesions. GI:  No-   heartburn, indigestion, abdominal pain, nausea, vomiting, diarrhea,                 change in bowel habits, loss of appetite GU: dysuria, change in color of urine, no urgency or frequency.   flank pain. MS:   joint pain, stiffness, decreased range of motion, back pain. Neuro-     nothing unusual Psych:  change in mood  or affect.  depression or anxiety.   memory loss.  OBJ- Physical Exam General- Alert, Oriented, Affect-appropriate, Distress- none acute.  + Overweight/ stocky Skin- rash-none, + tattoos Lymphadenopathy- none Head- atraumatic            Eyes- Gross vision intact, PERRLA, conjunctivae and secretions clear            Ears- Hearing, canals-normal            Nose- Clear, Septal dev+, No- mucus, polyps, erosion, perforation             Throat- Mallampati III , mucosa clear , drainage- none, tonsils- atrophic Neck- flexible , trachea midline, no stridor , thyroid nl, carotid no bruit Chest - symmetrical excursion , unlabored           Heart/CV- RRR , no murmur , no gallop  , no rub, nl s1 s2                           - JVD- none , edema- none, stasis changes- none, varices- none           Lung- clear to P&A, wheeze- none, cough- none , dullness-none, rub- none  Chest wall-  Abd-  Br/ Gen/ Rectal- Not done, not indicated Extrem- cyanosis- none, clubbing, none, atrophy- none, strength- nl Neuro- grossly intact to observation

## 2019-12-27 NOTE — Patient Instructions (Signed)
We can continue CPAP auto 5-20  Please call if we can help

## 2019-12-27 NOTE — Progress Notes (Signed)
HPI male never smoker followed for OSA, complicated by history of facial fracture/trauma, history DVT/PE/ Xarelto, ulcerative colitis Unattended Home Sleep Test 07/17/16-AHI 40.7/hour, desaturation to 81%, body weight 221 pounds  -------------------------------------------------------------------------------   12/21/2018- 54 year old male never smoker followed for OSA, complicated by history of facial fracture/trauma, history DVT/PE/ Xarelto, ulcerative colitis  CPAP auto 5-20/[facility] -----OSA on CPAP 5-20, DME: [facility]; no complaints  Body weight today 224 lbs Download compliance 100%, AHI 1.4/ hr He states he is better off with CPAP.  Declines flu vax and states general health is good. Remains on Xarelto for VTE.  12/27/19- 54 year old male never smoker followed for OSA, complicated by history of facial fracture/trauma, history DVT/PE/ Xarelto, ulcerative colitis  CPAP auto 5-20/[facility] Download compliance 100%, AHI 1.6/ hr Body weight today 233 lbs Covid vax- declined Flu vax- declined Sleeping well with CPAP. He denied other concerns. Did not choose to discuss Covid vax.  ROS-see HPI   + = positive Constitutional:    weight loss, night sweats, fevers, chills, +fatigue, lassitude. HEENT:    headaches, difficulty swallowing, tooth/dental problems, sore throat,       sneezing, itching, ear ache, nasal congestion, post nasal drip, snoring CV:    chest pain, orthopnea, PND, swelling in lower extremities, anasarca,                                                    dizziness, palpitations Resp:   shortness of breath with exertion or at rest.                productive cough,   non-productive cough, coughing up of blood.              change in color of mucus.  wheezing.   Skin:    rash or lesions. GI:  No-   heartburn, indigestion, abdominal pain, nausea, vomiting, diarrhea,                 change in bowel habits, loss of appetite GU: dysuria, change in color of urine, no urgency or  frequency.   flank pain. MS:   joint pain, stiffness, decreased range of motion, back pain. Neuro-     nothing unusual Psych:  change in mood or affect.  depression or anxiety.   memory loss.  OBJ- Physical Exam General- Alert, Oriented, Affect-appropriate, Distress- none acute.  + Overweight/ stocky Skin- rash-none, + tattoos Lymphadenopathy- none Head- atraumatic            Eyes- Gross vision intact, PERRLA, conjunctivae and secretions clear            Ears- Hearing, canals-normal            Nose- Clear, Septal dev+, No- mucus, polyps, erosion, perforation             Throat- Mallampati III , mucosa clear , drainage- none, tonsils- atrophic Neck- flexible , trachea midline, no stridor , thyroid nl, carotid no bruit Chest - symmetrical excursion , unlabored           Heart/CV- RRR , no murmur , no gallop  , no rub, nl s1 s2                           - JVD- none , edema- none, stasis changes- none, varices-  none           Lung- clear to P&A, wheeze- none, cough- none , dullness-none, rub- none           Chest wall-  Abd-  Br/ Gen/ Rectal- Not done, not indicated Extrem- cyanosis- none, clubbing, none, atrophy- none, strength- nl Neuro- grossly intact to observation

## 2020-01-03 ENCOUNTER — Encounter: Payer: BC Managed Care – PPO | Admitting: Family Medicine

## 2020-01-07 ENCOUNTER — Other Ambulatory Visit: Payer: Self-pay | Admitting: Family Medicine

## 2020-01-11 NOTE — Assessment & Plan Note (Signed)
Benefits from CPAP with good compliance and control Plan- continue CPAP auto 5-20

## 2020-01-25 DIAGNOSIS — G4733 Obstructive sleep apnea (adult) (pediatric): Secondary | ICD-10-CM | POA: Diagnosis not present

## 2020-01-27 ENCOUNTER — Other Ambulatory Visit: Payer: Self-pay | Admitting: Family Medicine

## 2020-03-16 ENCOUNTER — Telehealth (INDEPENDENT_AMBULATORY_CARE_PROVIDER_SITE_OTHER): Payer: BC Managed Care – PPO | Admitting: Family Medicine

## 2020-03-16 DIAGNOSIS — R0981 Nasal congestion: Secondary | ICD-10-CM

## 2020-03-16 DIAGNOSIS — R059 Cough, unspecified: Secondary | ICD-10-CM | POA: Diagnosis not present

## 2020-03-16 MED ORDER — BENZONATATE 100 MG PO CAPS: 100.0000 mg | ORAL_CAPSULE | Freq: Three times a day (TID) | ORAL | 0 refills | Status: DC | PRN

## 2020-03-16 MED ORDER — DOXYCYCLINE HYCLATE 100 MG PO TABS: 100.0000 mg | ORAL_TABLET | Freq: Two times a day (BID) | ORAL | 0 refills | Status: DC

## 2020-03-16 NOTE — Patient Instructions (Signed)
   ---------------------------------------------------------------------------------------------------------------------------      WORK SLIP:  Patient [name],  May 15, 1965, was seen for a medical visit today, 03/16/20 . Please excuse from work for a COVID like illness. We advise 10 days minimum from the onset of symptoms PLUS 1 day of no fever and improved symptoms. Will defer to employer for a sooner return to work if symptoms have resolved, it is greater than 5 days since the positive test and the patient can wear a high-quality, tight fitting mask such as N95 or KN95 at all times for an additional 5 days. Would also suggest COVID19 antigen testing is negative prior to return.  Sincerely: E-signature: Dr. Colin Benton, DO [place] Ph: [phone]   ------------------------------------------------------------------------------------------------------------------------------    HOME CARE TIPS:  -[place] testing information: https://www.[facility].org/ OR 201-813-3606 Most pharmacies also offer testing and home test kits.  -I sent the medication(s) we discussed to your pharmacy: Meds ordered this encounter  Medications  . doxycycline (VIBRA-TABS) 100 MG tablet    Sig: Take 1 tablet (100 mg total) by mouth 2 (two) times daily.    Dispense:  20 tablet    Refill:  0  . benzonatate (TESSALON PERLES) 100 MG capsule    Sig: Take 1 capsule (100 mg total) by mouth 3 (three) times daily as needed.    Dispense:  20 capsule    Refill:  0     -can use tylenol if needed for fevers, aches and pains per instructions  -can use nasal saline a few times per day if nasal congestion  -stay hydrated, drink plenty of fluids and eat small healthy meals - avoid dairy  -can take 1000 IU Vit D3 and Vit C lozenges per instructions  -If the Covid test is positive, check out the CDC website for more information on home care,  transmission and treatment for COVID19  -follow up with your doctor in 2-3 days unless improving and feeling better  -stay home while sick, except to seek medical care  It was nice to meet you today, and I really hope you are feeling better soon. I help [facility] out with telemedicine visits on Tuesdays and Thursdays and am available for visits on those days. If you have any concerns or questions following this visit please schedule a follow up visit with your Primary Care doctor or seek care at a local urgent care clinic to avoid delays in care.    Seek in person care promptly if your symptoms worsen, new concerns arise or you are not improving with treatment. Call 911 and/or seek emergency care if you symptoms are severe or life threatening.

## 2020-03-16 NOTE — Progress Notes (Signed)
Virtual Visit via Telephone Note  I connected with [name] on 03/16/20 at 11:20 AM EST by telephone and verified that I am speaking with the correct person using two identifiers.   I discussed the limitations, risks, security and privacy concerns of performing an evaluation and management service by telephone and the availability of in person appointments. I also discussed with the patient that there may be a patient responsible charge related to this service. The patient expressed understanding and agreed to proceed.  Location patient: home, [place] Location provider: work or home office Participants present for the call: patient, provider Patient did not have a visit with me in the prior 7 days to address this/these issue(s).   History of Present Illness:  Acute telemedicine visit for a cough: -Onset: started around Jan 1st -Symptoms include: initially fever, body aches, sinus and head congestion, cough, fever resolved and now mainly is dealing with sinus congestion and a bad cough and a "rattle" in the chest, sometime mucus is thicker -Denies: CP, SOB, hemoptysis, malaise, fevers, NVD, bodyaches -Pertinent past medical history:HTN, thyroid dz, UCC - not on any meds, OSA -Pertinent medication allergies: flagyl -COVID-19 vaccine status: not vaccinated   Observations/Objective: Patient sounds cheerful and well on the phone. I do not appreciate any SOB. Speech and thought processing are grossly intact. Patient reported vitals:  Assessment and Plan:  Cough  Nasal congestion  -we discussed possible serious and likely etiologies, options for evaluation and workup, limitations of telemedicine visit vs in person visit, treatment, treatment risks and precautions. Pt prefers to treat via telemedicine empirically rather than in person at this moment.  Query sinusitis, possible lower respiratory infection versus new acute illness such as viral upper respiratory infection, bronchitis or  COVID-19 versus other.  Given the duration of symptoms and rattle in the chest opted to cover with an antibiotic, doxycycline 100 mg twice daily for 10 days.  Also sent Tessalon Rx for cough.  Discussed COVID-19 testing, treatment, potential complications, isolation and precautions. Work/School slipped offered: provided in patient instructions  Scheduled follow up with PCP offered: Agrees to follow-up with PCP office if needed. Advised to seek prompt follow-up with PCP office or in person care if worsening, new symptoms arise, or if is not improving with treatment. Advised of options for inperson care in case PCP office not available. Did let the patient know that I only do telemedicine shifts for [facility] on Tuesdays and Thursdays and advised a follow up visit with PCP or at an [facility] if has further questions or concerns.   Follow Up Instructions:  I did not refer this patient for an OV with me in the next 24 hours for this/these issue(s).  I discussed the assessment and treatment plan with the patient. The patient was provided an opportunity to ask questions and all were answered. The patient agreed with the plan and demonstrated an understanding of the instructions.   I spent 17 minutes on the date of this visit in the care of this patient. See summary of tasks completed to properly care for this patient in the detailed notes above and included review of PMH, medications, allergies, evaluation of the patient and ordering and instructing patient on testing and care options.     Ian Kern, DO

## 2020-04-20 ENCOUNTER — Other Ambulatory Visit: Payer: Self-pay

## 2020-04-20 ENCOUNTER — Ambulatory Visit (INDEPENDENT_AMBULATORY_CARE_PROVIDER_SITE_OTHER): Payer: BC Managed Care – PPO | Admitting: Family Medicine

## 2020-04-20 ENCOUNTER — Encounter: Payer: Self-pay | Admitting: Family Medicine

## 2020-04-20 VITALS — BP 138/86 | HR 73 | Temp 98.3°F | Ht 68.0 in | Wt 234.2 lb

## 2020-04-20 DIAGNOSIS — E785 Hyperlipidemia, unspecified: Secondary | ICD-10-CM

## 2020-04-20 DIAGNOSIS — Z1159 Encounter for screening for other viral diseases: Secondary | ICD-10-CM

## 2020-04-20 DIAGNOSIS — Z0000 Encounter for general adult medical examination without abnormal findings: Secondary | ICD-10-CM | POA: Diagnosis not present

## 2020-04-20 DIAGNOSIS — Z125 Encounter for screening for malignant neoplasm of prostate: Secondary | ICD-10-CM

## 2020-04-20 DIAGNOSIS — F324 Major depressive disorder, single episode, in partial remission: Secondary | ICD-10-CM

## 2020-04-20 DIAGNOSIS — I10 Essential (primary) hypertension: Secondary | ICD-10-CM

## 2020-04-20 DIAGNOSIS — E291 Testicular hypofunction: Secondary | ICD-10-CM

## 2020-04-20 NOTE — Progress Notes (Signed)
Phone: [phone]   Subjective:  Patient presents today for their annual physical. Chief complaint-noted.   See problem oriented charting- ROS- full  review of systems was completed and negative  except for: tinnitus unchanged  The following were reviewed and entered/updated in epic: Past Medical History:  Diagnosis Date  . Anal fissure   . Anxiety   . Depression   . DVT (deep venous thrombosis) ([place])   . Hypertension, essential 12/05/2017  . Hypogonadism male   . Multinodular goiter   . Pulmonary embolism ([place])   . Sleep apnea with use of continuous positive airway pressure (CPAP)   . Tubular adenoma of colon   . Ulcerative colitis, unspecified    Patient Active Problem List   Diagnosis Date Noted  . Memory difficulties 03/10/2017    Priority: High  . Hypercoagulable state ([place]) 04/04/2015    Priority: High  . Thyroid nodule 02/01/2014    Priority: High  . Chronic pulmonary embolism ([place])- see discharge summary 01/2014. ? related to testosterone therapy 01/31/2014    Priority: High  . Ulcerative colitis ([place]) 03/04/1992    Priority: High  . Hypertension, essential 12/05/2017    Priority: Medium  . Obstructive sleep apnea 07/03/2016    Priority: Medium  . History of DVT (deep vein thrombosis) 02/10/2014    Priority: Medium  . Hypogonadism in male 01/15/2007    Priority: Medium  . Major depression in full remission ([place]) 01/15/2007    Priority: Medium  . Hyperlipemia 01/09/2007    Priority: Medium  . 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 Depression, major, single episode, in partial remission ([place]) 01/15/2007   Past Surgical History:  Procedure Laterality Date  . MANDIBULAR HARDWARE REMOVAL Bilateral 07/10/2012   Procedure: BILATERAL MANDIBULAR HARDWARE REMOVAL;  Surgeon: Jerrell Belfast, MD;  Location: [facility];  Service: ENT;  Laterality: Bilateral;  . ORIF MANDIBULAR  FRACTURE Left 05/09/2012   Procedure: OPEN REDUCTION INTERNAL FIXATION (ORIF) RIGHT ANTERIOR MANDIBULAR FRACTURE; Open Reduction Internal Fixation of Midface fracture; Reduction of septal fracture; arch bars; Repair of lacerations;  Surgeon: Jerrell Belfast, MD;  Location: [facility];  Service: ENT;  Laterality: Left;  . TRACHEOSTOMY TUBE PLACEMENT N/A 05/09/2012   Procedure: TRACHEOSTOMY;  Surgeon: Jerrell Belfast, MD;  Location: [facility] OR;  Service: ENT;  Laterality: N/A;    Family History  Problem Relation Age of Onset  . COPD Father   . Arthritis Mother   . Irritable bowel syndrome Mother   . Healthy Brother   . Healthy Brother   . Lung cancer Paternal Grandmother   . Colon cancer Neg Hx   . Esophageal cancer Neg Hx   . Rectal cancer Neg Hx     Medications- reviewed and updated Current Outpatient Medications  Medication Sig Dispense Refill  . diclofenac sodium (VOLTAREN) 1 % GEL Apply 2 g topically 4 (four) times daily. (Patient taking differently: Apply 2 g topically 2 (two) times daily as needed (PAIN).) 100 g 1  . sertraline (ZOLOFT) 25 MG tablet TAKE 1 TABLET BY MOUTH EVERY DAY 90 tablet 3  . tadalafil (CIALIS) 20 MG tablet TAKE 1/2 TO 1 TABLET BY MOUTH EVERY OTHER DAY AS NEEDED FOR ERECTILE DYSFUNCTION 6 tablet 19  . testosterone (ANDROGEL) 50 MG/5GM (1%) GEL APPLY 1-1/2 PACKETS ONCE DAILY on shoulder area 300 g 2  . XARELTO 20 MG TABS tablet TAKE 1  TABLET BY MOUTH EVERY DAY 90 tablet 3   No current facility-administered medications for this visit.    Allergies-reviewed and updated Allergies  Allergen Reactions  . 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    Objective  Objective:  BP 138/86   Pulse 73   Temp 98.3 F (36.8 C) (Temporal)   Ht 5' 8"  (1.727 m)   Wt 234 lb 3.2 oz  (106.2 kg)   SpO2 95%   BMI 35.61 kg/m  Gen: NAD, resting comfortably HEENT: Mucous membranes are moist. Oropharynx normal Neck: no thyromegaly CV: RRR no murmurs rubs or gallops Lungs: CTAB no crackles, wheeze, rhonchi Abdomen: soft/nontender/nondistended/normal bowel sounds. No rebound or guarding.  Ext: no edema Skin: warm, dry Neuro: grossly normal, moves all extremities, PERRLA    Assessment and Plan  55 y.o. male presenting for annual physical.  Health Maintenance counseling: 1. Anticipatory guidance: Patient counseled regarding regular dental exams -q6 months, eye exams -yearly,  avoiding smoking and second hand smoke , limiting alcohol to 2 beverages per day - rare to never in last few years.  No illicit drugs 2. Risk factor reduction:  Advised patient of need for regular exercise and diet rich and fruits and vegetables to reduce risk of heart attack and stroke. Exercise- walks dogs some- wants to get back to [facility]. Diet-diet has slipped up some- feels could improve. Does get some fresh salads in- has mainly backslid into lunch at work a fair amount- needs ental break from work. Could try calorie counting- thinks may be challenging for him. Sometimes wonders if sugar falling- lightheaded, mood goes down a liil - better with food Wt Readings from Last 3 Encounters:  04/20/20 234 lb 3.2 oz (106.2 kg)  12/27/19 233 lb 12.8 oz (106.1 kg)  12/20/19 233 lb 9.6 oz (106 kg)  3. Immunizations/screenings/ancillary studies- discussed HCV screen- hes ok with this. Declines flu shot and shingrix for now . Declines covid vaccine.  Immunization History  Administered Date(s) Administered  . DTP 10/06/2006  . Influenza,inj,Quad PF,6+ Mos 02/02/2014, 12/18/2017  . Influenza-Unspecified 02/02/2014, 11/03/2014, 12/02/2017, 12/18/2017  . Td 03/05/1991, 10/06/2006  . Tdap 03/05/1991, 10/06/2006, 05/09/2012  4. Prostate cancer screening-  with testosterone gel we will trend PSA  Lab Results   Component Value Date   PSA 1.62 06/28/2019   PSA 2.65 12/28/2018   PSA 1.12 05/10/2013   5. Colon cancer screening - history of ulcerative colitis. 05/11/19 and was told 2 year repeat 6. Skin cancer screening- no dermatoligist. advised regular sunscreen use. Denies worrisome, changing, or new skin lesions.  7. Never smoker 8. STD screening - he declines- no concerns about infidelity   Status of chronic or acute concerns   #right foot pain- did much better with corn removal  #bx skin tag- benign  #ulcerative colitis- had done well on lialda in past and came off and still no issues (no cramping, blood in stool)- repeat in 2 year colonoscopy planned. He prefers to remain off and follow u p only if issues.   #Hypercoagulable state/chronic PE S: Patient remains on chronic Xarelto 20 mg.  Left leg DVT 02/01/2014 and also with pulmonary embolism and then later had superficial clot in the left GSV but with potential to extend to distal system noted  01/06/2015 so we have opted for lifelong therapy. -no cp/sob/leg swelling A/P: doing well- continue current medicines  # Low testosterone S:remains on testosterone gel - plans to call back to schedule with Dr. Buddy Duty A/P: update testosterone with labs   # Depression S: Medication: Zoloft 25Mg. Some mild winter blues Depression screen [facility] 2/9 04/20/2020 04/20/2020 12/20/2019  Decreased Interest 0 0 0  Down, Depressed, Hopeless 0 0 0  PHQ - 2 Score 0 0 0  Altered sleeping 0 - 0  Tired, decreased energy 0 - 0  Change in appetite 0 - 0  Feeling bad or failure about yourself  0 - 0  Trouble concentrating 0 - 0  Moving slowly or fidgety/restless 0 - 0  Suicidal thoughts 0 - 0  PHQ-9 Score 0 - 0  Difficult doing work/chores Not difficult at all - -  Some recent data might be hidden  A/P: full remission- continue to monitor.   #hyperlipidemia S: Medication:none  Lab Results  Component Value Date   CHOL 202 (H) 12/28/2018   HDL 29.70 (L)  12/28/2018   LDLCALC 151 (H) 05/10/2013   LDLDIRECT 120.0 12/28/2018   TRIG 428.0 (H) 12/28/2018   CHOLHDL 7 12/28/2018   A/P: hopefully improved- update lipid panel when he comes back for fasting labs. 10 year ascvd risk of 8.1%- discussed statin, lifestyle, coronary calcium- he wants to focus on lifestyle most likely.   #hypertension S: medication: none- after prior weight loss has been able to stay off meds BP Readings from Last 3 Encounters:  04/20/20 138/86  12/27/19 124/74  12/20/19 126/80  A/P: Stable. Continue current medications.  - checks home pressures   Recommended follow up: Return in about 1 year (around 04/20/2021) for physical or sooner if needed. will monitor home BP  Lab/Order associations: NOT fasting- will come back fasting   ICD-10-CM   1. Preventative health care  Z00.00   2. Hypertension, essential  I10   3. Hyperlipidemia, unspecified hyperlipidemia type  E78.5   4. Depression, major, single episode, in partial remission ([place])  F32.4   5. Encounter for hepatitis C screening test for low risk patient  Z11.59   6. Screening for prostate cancer  Z12.5   7. Hypogonadism in male  E29.1     Return precautions advised.  Garret Reddish, MD

## 2020-04-20 NOTE — Patient Instructions (Addendum)
Schedule a lab visit at the check out desk within 2 weeks. Return for future fasting labs meaning nothing but water after midnight please. Ok to take your medications with water.   No changes today unless labs lead [place] to make changes  Recommended follow up: Return in about 1 year (around 04/20/2021) for physical or sooner if needed. If you stretch out to a year lets have you make sure to keep eye on blood pressure and make sure <135/85 on average

## 2020-04-24 DIAGNOSIS — G4733 Obstructive sleep apnea (adult) (pediatric): Secondary | ICD-10-CM | POA: Diagnosis not present

## 2020-05-04 ENCOUNTER — Encounter: Payer: Self-pay | Admitting: Family Medicine

## 2020-05-04 ENCOUNTER — Other Ambulatory Visit: Payer: Self-pay

## 2020-05-04 ENCOUNTER — Other Ambulatory Visit: Payer: Self-pay | Admitting: Family Medicine

## 2020-05-04 ENCOUNTER — Other Ambulatory Visit (INDEPENDENT_AMBULATORY_CARE_PROVIDER_SITE_OTHER): Payer: BC Managed Care – PPO

## 2020-05-04 DIAGNOSIS — I10 Essential (primary) hypertension: Secondary | ICD-10-CM

## 2020-05-04 DIAGNOSIS — Z0000 Encounter for general adult medical examination without abnormal findings: Secondary | ICD-10-CM

## 2020-05-04 DIAGNOSIS — E291 Testicular hypofunction: Secondary | ICD-10-CM | POA: Diagnosis not present

## 2020-05-04 DIAGNOSIS — E785 Hyperlipidemia, unspecified: Secondary | ICD-10-CM | POA: Diagnosis not present

## 2020-05-04 DIAGNOSIS — Z125 Encounter for screening for malignant neoplasm of prostate: Secondary | ICD-10-CM | POA: Diagnosis not present

## 2020-05-04 DIAGNOSIS — Z1159 Encounter for screening for other viral diseases: Secondary | ICD-10-CM | POA: Diagnosis not present

## 2020-05-04 LAB — CBC WITH DIFFERENTIAL/PLATELET
Basophils Absolute: 0 10*3/uL (ref 0.0–0.1)
Basophils Relative: 0.5 % (ref 0.0–3.0)
Eosinophils Absolute: 0.1 10*3/uL (ref 0.0–0.7)
Eosinophils Relative: 2 % (ref 0.0–5.0)
HCT: 48.7 % (ref 39.0–52.0)
Hemoglobin: 16.6 g/dL (ref 13.0–17.0)
Lymphocytes Relative: 43.2 % (ref 12.0–46.0)
Lymphs Abs: 2.1 10*3/uL (ref 0.7–4.0)
MCHC: 34.1 g/dL (ref 30.0–36.0)
MCV: 88.8 fl (ref 78.0–100.0)
Monocytes Absolute: 0.5 10*3/uL (ref 0.1–1.0)
Monocytes Relative: 10 % (ref 3.0–12.0)
Neutro Abs: 2.1 10*3/uL (ref 1.4–7.7)
Neutrophils Relative %: 44.3 % (ref 43.0–77.0)
Platelets: 189 10*3/uL (ref 150.0–400.0)
RBC: 5.48 Mil/uL (ref 4.22–5.81)
RDW: 14.8 % (ref 11.5–15.5)
WBC: 4.8 10*3/uL (ref 4.0–10.5)

## 2020-05-04 LAB — TESTOSTERONE: Testosterone: 258.61 ng/dL — ABNORMAL LOW (ref 300.00–890.00)

## 2020-05-04 LAB — COMPREHENSIVE METABOLIC PANEL
ALT: 53 U/L (ref 0–53)
AST: 26 U/L (ref 0–37)
Albumin: 4.3 g/dL (ref 3.5–5.2)
Alkaline Phosphatase: 61 U/L (ref 39–117)
BUN: 19 mg/dL (ref 6–23)
CO2: 28 mEq/L (ref 19–32)
Calcium: 9.8 mg/dL (ref 8.4–10.5)
Chloride: 102 mEq/L (ref 96–112)
Creatinine, Ser: 1 mg/dL (ref 0.40–1.50)
GFR: 86.67 mL/min (ref >60.00)
Glucose, Bld: 118 mg/dL — ABNORMAL HIGH (ref 70–99)
Potassium: 3.9 mEq/L (ref 3.5–5.1)
Sodium: 138 mEq/L (ref 135–145)
Total Bilirubin: 1.2 mg/dL (ref 0.2–1.2)
Total Protein: 7.4 g/dL (ref 6.0–8.3)

## 2020-05-04 LAB — PSA: PSA: 1.46 ng/mL (ref 0.10–4.00)

## 2020-05-04 LAB — LIPID PANEL
Cholesterol: 192 mg/dL (ref 0–200)
HDL: 28.3 mg/dL — ABNORMAL LOW (ref >39.00)
Total CHOL/HDL Ratio: 7
Triglycerides: 468 mg/dL — ABNORMAL HIGH (ref 0.0–149.0)

## 2020-05-04 LAB — LDL CHOLESTEROL, DIRECT: Direct LDL: 97 mg/dL

## 2020-05-05 ENCOUNTER — Encounter: Payer: Self-pay | Admitting: Family Medicine

## 2020-05-05 LAB — HEPATITIS C ANTIBODY
Hepatitis C Ab: NONREACTIVE
SIGNAL TO CUT-OFF: 0.02 (ref <1.00)

## 2020-05-20 DIAGNOSIS — G4733 Obstructive sleep apnea (adult) (pediatric): Secondary | ICD-10-CM | POA: Diagnosis not present

## 2020-06-21 ENCOUNTER — Encounter: Payer: Self-pay | Admitting: Family Medicine

## 2020-06-21 ENCOUNTER — Ambulatory Visit (INDEPENDENT_AMBULATORY_CARE_PROVIDER_SITE_OTHER): Payer: BC Managed Care – PPO | Admitting: Family Medicine

## 2020-06-21 ENCOUNTER — Other Ambulatory Visit: Payer: Self-pay

## 2020-06-21 VITALS — BP 124/72 | HR 67 | Temp 97.9°F | Wt 236.1 lb

## 2020-06-21 DIAGNOSIS — R609 Edema, unspecified: Secondary | ICD-10-CM

## 2020-06-21 NOTE — Progress Notes (Signed)
Established Patient Office Visit  Subjective:  Patient ID: [name], male    DOB: 1965/09/04  Age: 55 y.o. MRN: [id number]  CC:  Chief Complaint  Patient presents with  . neck swelling    Neck swells after eating, bilateral, no pain, slight discomfort, x 1 day,     HPI [name] presents for onset yesterday of swelling right submandibular region.  He ate a [employer] bar at work around 7:30 AM and within minutes noticed some swelling right submandibular region greater than left.  After several minutes this started going back down.  He is noted with eating since then consistently more swelling after eating.  No redness.  No warmth.  Went to company health care provider yesterday and had rapid strep test which is negative.  He denies any sore throat.  No fevers or chills.  No history of similar problem.  No recent appetite or weight changes.  Past Medical History:  Diagnosis Date  . Anal fissure   . Anxiety   . Depression   . DVT (deep venous thrombosis) ([place])   . Hypertension, essential 12/05/2017  . Hypogonadism male   . Multinodular goiter   . Pulmonary embolism ([place])   . Sleep apnea with use of continuous positive airway pressure (CPAP)   . Tubular adenoma of colon   . Ulcerative colitis, unspecified     Past Surgical History:  Procedure Laterality Date  . MANDIBULAR HARDWARE REMOVAL Bilateral 07/10/2012   Procedure: BILATERAL MANDIBULAR HARDWARE REMOVAL;  Surgeon: Jerrell Belfast, MD;  Location: [place] [facility];  Service: ENT;  Laterality: Bilateral;  . ORIF MANDIBULAR FRACTURE Left 05/09/2012   Procedure: OPEN REDUCTION INTERNAL FIXATION (ORIF) RIGHT ANTERIOR MANDIBULAR FRACTURE; Open Reduction Internal Fixation of Midface fracture; Reduction of septal fracture; arch bars; Repair of lacerations;  Surgeon: Jerrell Belfast, MD;  Location: [facility];  Service: ENT;  Laterality: Left;  . TRACHEOSTOMY TUBE PLACEMENT N/A 05/09/2012   Procedure: TRACHEOSTOMY;  Surgeon: Jerrell Belfast,  MD;  Location: [facility] OR;  Service: ENT;  Laterality: N/A;    Family History  Problem Relation Age of Onset  . COPD Father   . Arthritis Mother   . Irritable bowel syndrome Mother   . Healthy Brother   . Healthy Brother   . Lung cancer Paternal Grandmother   . Colon cancer Neg Hx   . Esophageal cancer Neg Hx   . Rectal cancer Neg Hx     Social History   Socioeconomic History  . Marital status: Divorced    Spouse name: Not on file  . Number of children: 1  . Years of education: Not on file  . Highest education level: Not on file  Occupational History  . Occupation: Estate agent)  Tobacco Use  . Smoking status: Never Smoker  . Smokeless tobacco: Never Used  Vaping Use  . Vaping Use: Never used  Substance and Sexual Activity  . Alcohol use: Yes    Comment: rarely  . Drug use: No  . Sexual activity: Yes  Other Topics Concern  . 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    Social Determinants of Health   Financial Resource Strain: Not on file  Food Insecurity: Not on file  Transportation Needs: Not on file  Physical Activity: Not on file  Stress: Not on file  Social Connections: Not  on file  Intimate Partner Violence: Not on file    Outpatient Medications Prior to Visit  Medication Sig Dispense Refill  . diclofenac sodium (VOLTAREN) 1 % GEL Apply 2 g topically 4 (four) times daily. (Patient taking differently: Apply 2 g topically 2 (two) times daily as needed (PAIN).) 100 g 1  . sertraline (ZOLOFT) 25 MG tablet TAKE 1 TABLET BY MOUTH EVERY DAY 90 tablet 3  . tadalafil (CIALIS) 20 MG tablet TAKE 1/2 TO 1 TABLET BY MOUTH EVERY OTHER DAY AS NEEDED FOR ERECTILE DYSFUNCTION 6 tablet 19  . testosterone (ANDROGEL) 50 MG/5GM (1%) GEL APPLY 1-1/2 PACKETS ONCE DAILY on shoulder area 300 g 2  . XARELTO 20 MG TABS tablet TAKE 1 TABLET BY  MOUTH EVERY DAY 90 tablet 3   No facility-administered medications prior to visit.    Allergies  Allergen Reactions  . Flagyl [Metronidazole] Other (See Comments)    REACTION: Fatigue    ROS Review of Systems  Constitutional: Negative for chills and fever.  HENT: Negative for sore throat and trouble swallowing.   Respiratory: Negative for cough and shortness of breath.   Hematological: Negative for adenopathy.      Objective:    Physical Exam Vitals reviewed.  Constitutional:      Appearance: Normal appearance.  HENT:     Mouth/Throat:     Mouth: Mucous membranes are moist.     Pharynx: Oropharynx is clear. No oropharyngeal exudate or posterior oropharyngeal erythema.  Neck:     Comments: Mild swelling of the right submandibular gland.  No overlying redness or warmth.  Minimally tender to palpation.  No neck adenopathy noted Cardiovascular:     Rate and Rhythm: Normal rate and regular rhythm.  Musculoskeletal:     Cervical back: Neck supple.  Lymphadenopathy:     Cervical: No cervical adenopathy.  Neurological:     Mental Status: He is alert.     BP 124/72 (BP Location: Left Arm, Patient Position: Sitting, Cuff Size: Normal)   Pulse 67   Temp 97.9 F (36.6 C) (Oral)   Wt 236 lb 1.6 oz (107.1 kg)   SpO2 97%   BMI 35.90 kg/m  Wt Readings from Last 3 Encounters:  06/21/20 236 lb 1.6 oz (107.1 kg)  04/20/20 234 lb 3.2 oz (106.2 kg)  12/27/19 233 lb 12.8 oz (106.1 kg)     There are no preventive care reminders to display for this patient.  There are no preventive care reminders to display for this patient.  Lab Results  Component Value Date   TSH 1.07 12/28/2018   Lab Results  Component Value Date   WBC 4.8 05/04/2020   HGB 16.6 05/04/2020   HCT 48.7 05/04/2020   MCV 88.8 05/04/2020   PLT 189.0 05/04/2020   Lab Results  Component Value Date   NA 138 05/04/2020   K 3.9 05/04/2020   CHLORIDE 104 04/04/2015   CO2 28 05/04/2020   GLUCOSE 118 (H)  05/04/2020   BUN 19 05/04/2020   CREATININE 1.00 05/04/2020   BILITOT 1.2 05/04/2020   ALKPHOS 61 05/04/2020   AST 26 05/04/2020   ALT 53 05/04/2020   PROT 7.4 05/04/2020   ALBUMIN 4.3 05/04/2020   CALCIUM 9.8 05/04/2020   ANIONGAP 9 09/10/2018   EGFR >90 04/04/2015   GFR 86.67 05/04/2020   Lab Results  Component Value Date   CHOL 192 05/04/2020   Lab Results  Component Value Date   HDL 28.30 (L) 05/04/2020  Lab Results  Component Value Date   LDLCALC 151 (H) 05/10/2013   Lab Results  Component Value Date   TRIG (H) 05/04/2020    468.0 Triglyceride is over 400; calculations on Lipids are invalid.   Lab Results  Component Value Date   CHOLHDL 7 05/04/2020   No results found for: HGBA1C    Assessment & Plan:   Problem List Items Addressed This Visit   None   Visit Diagnoses    Submandibular gland swelling    -  Primary    Acute swelling right submandibular gland with symptoms worse after eating.  Suspect either some sludge or possibly stone right submandibular duct.  No signs of infection currently  -Recommend stay well-hydrated -Consider warm compresses -Consider sialagogues such as sour candies to stimulate saliva production -Gentle massage of gland -Watch for any signs of secondary infection such as redness or warmth -Follow-up with primary if symptoms not resolving over the next couple weeks  No orders of the defined types were placed in this encounter.   Follow-up: No follow-ups on file.    Carolann Littler, MD

## 2020-06-21 NOTE — Patient Instructions (Signed)
Salivary Stone  A salivary stone is a mineral deposit that builds up in the ducts that drain your salivary glands. Most salivary stones are made of calcium. When a stone forms, saliva can back up into the gland and cause painful swelling. Your salivary glands are the glands that produce saliva. You have six major salivary glands. Each gland has a duct that carries saliva into your mouth. Saliva keeps your mouth moist and breaks down the food that you eat. It also helps prevent tooth decay. Two salivary glands are located just in front of your ears (parotid). The ducts for these glands open up inside your cheeks, near your back teeth. You also have two glands under your tongue (sublingual) and two glands under your jaw (submandibular). The ducts for these glands open under your tongue. A stone can form in any salivary gland. The most common place for a salivary stone to develop is in a submandibular salivary gland. What are the causes? Salivary stones may be caused by any condition that reduces the flow of saliva. It is not known why some people form stones. What increases the risk? You are more likely to develop this condition if:  You are male.  You do not drink enough water.  You smoke.  You have any of the following: ? High blood pressure. ? Gout. ? Diabetes. What are the signs or symptoms? The main sign of a salivary gland stone is sudden swelling of a salivary gland when eating. This usually happens under the jaw on one side. Other signs and symptoms may include:  Swelling of the cheek or under the tongue when eating.  Pain in the swollen area.  Trouble chewing or swallowing.  Swelling that goes down after eating. How is this diagnosed? This condition may be diagnosed based on:  Your signs and symptoms.  A physical exam. In many cases, your health care provider will be able to feel the stone in a duct inside your mouth.  Imaging studies, such  as: ? X-rays. ? Ultrasound. ? CT scan. ? MRI. You may need to see an ear, nose, and throat specialist (ENT or otolaryngologist) for diagnosis and treatment. How is this treated? Treatment for this condition depends on the size of the stone.  A small stone that is not causing symptoms may be treated with home care.  For a stone that is large enough to cause symptoms, the treatment options may include: ? Probing and widening of the duct to allow the stone to pass. ? Inserting a thin, flexible scope (endoscope) into the duct to locate and remove the stone. ? Breaking up the stone with sound waves. ? Removing the entire salivary gland. Follow these instructions at home: To relieve discomfort  Follow these instructions every few hours: ? Suck on a lemon candy to stimulate the flow of saliva. ? Put a warm compress over the gland. ? Gently massage the gland. General instructions  Drink enough fluid to keep your urine pale yellow.  Do not use any products that contain nicotine or tobacco, such as cigarettes and e-cigarettes. If you need help quitting, ask your health care provider.  Keep all follow-up visits as told by your health care provider. This is important.   Contact a health care provider if:  You have pain and swelling in your face, jaw, or mouth after eating.  You have persistent swelling in any of these places: ? In front of your ear. ? Under your jaw. ? Inside your mouth.  Get help right away if:  You have pain and swelling in your face, jaw, or mouth, and this is getting worse.  Your pain and swelling make it hard to swallow or breathe. Summary  A salivary stone is a mineral deposit that builds up in the ducts that drain your salivary glands.  When a stone forms, saliva can back up into the gland and cause painful swelling.  Salivary stones may be caused by any condition that reduces the flow of saliva.  Treatment for this condition depends on the size of the  stone. This information is not intended to replace advice given to you by your health care provider. Make sure you discuss any questions you have with your health care provider. Document Revised: 03/31/2017 Document Reviewed: 03/31/2017 Elsevier Patient Education  2021 [employer].

## 2020-07-25 DIAGNOSIS — G4733 Obstructive sleep apnea (adult) (pediatric): Secondary | ICD-10-CM | POA: Diagnosis not present

## 2020-08-03 ENCOUNTER — Encounter: Payer: Self-pay | Admitting: Podiatry

## 2020-08-03 ENCOUNTER — Ambulatory Visit (INDEPENDENT_AMBULATORY_CARE_PROVIDER_SITE_OTHER): Payer: BC Managed Care – PPO | Admitting: Podiatry

## 2020-08-03 ENCOUNTER — Other Ambulatory Visit: Payer: Self-pay

## 2020-08-03 DIAGNOSIS — L603 Nail dystrophy: Secondary | ICD-10-CM

## 2020-08-03 DIAGNOSIS — E230 Hypopituitarism: Secondary | ICD-10-CM | POA: Insufficient documentation

## 2020-08-03 DIAGNOSIS — Z86711 Personal history of pulmonary embolism: Secondary | ICD-10-CM | POA: Insufficient documentation

## 2020-08-05 NOTE — Progress Notes (Signed)
He presents today concerned that his orthotics may be causing him to develop ingrown toenails.  States that he has had them off and on since he has been wearing his orthotics.  He is wondering if any adjustments need to be made to the insoles.  He states that the orthotics themselves feel very good  Objective: Vital signs are stable he is alert oriented x3.  Pulses are palpable.  His nail plates are flat and he does have crowding of his toes.  He has a rectus foot type and his nail plates do demonstrate some sharp margins with mild erythema of the soft tissue.  No purulence no malodor noted.  I looked at the orthotics in the shoes that he is wearing in the orthotics are straight but the shoes he is wearing a curved orthotic actually rolls up on the lateral side resulting in more crowding of his toes.  I evaluated the sole of the shoe and compared it with his rectus foot type.  The shoe that he is currently wearing his orthotics in is a curve last shoe.  This would result in crowding even more so the lateral aspect of his foot pressing his toes together.  Assessment: Mild paronychia is associated with the orthotics.  Also associated with his shoe gear.  Plan: Discussed appropriate shoe gear today.  Expressed to him that if my recommendations did not accomplish the reduction in symptoms that he is looking for please let me know and I would be more than happy to perform matrixectomy's.

## 2020-08-06 ENCOUNTER — Other Ambulatory Visit: Payer: Self-pay | Admitting: Family Medicine

## 2020-10-21 ENCOUNTER — Other Ambulatory Visit: Payer: Self-pay | Admitting: Family Medicine

## 2020-10-24 DIAGNOSIS — G4733 Obstructive sleep apnea (adult) (pediatric): Secondary | ICD-10-CM | POA: Diagnosis not present

## 2020-11-02 DIAGNOSIS — Z125 Encounter for screening for malignant neoplasm of prostate: Secondary | ICD-10-CM | POA: Diagnosis not present

## 2020-11-02 DIAGNOSIS — G4730 Sleep apnea, unspecified: Secondary | ICD-10-CM | POA: Diagnosis not present

## 2020-11-02 DIAGNOSIS — E230 Hypopituitarism: Secondary | ICD-10-CM | POA: Diagnosis not present

## 2020-11-02 DIAGNOSIS — Z86711 Personal history of pulmonary embolism: Secondary | ICD-10-CM | POA: Diagnosis not present

## 2020-11-02 LAB — HEPATIC FUNCTION PANEL
ALT: 34 (ref 10–40)
AST: 19 (ref 14–40)
Bilirubin, Direct: 0.1 (ref 0.01–0.4)

## 2020-11-02 LAB — CBC AND DIFFERENTIAL
HCT: 49 (ref 41–53)
Hemoglobin: 16.7 (ref 13.5–17.5)
Platelets: 219 (ref 150–399)
WBC: 5.6

## 2020-11-02 LAB — TESTOSTERONE: Testosterone: 484.4

## 2020-11-02 LAB — CBC: RBC: 5.5 — AB (ref 3.87–5.11)

## 2020-11-02 LAB — COMPREHENSIVE METABOLIC PANEL: Albumin: 4.5 (ref 3.5–5.0)

## 2020-11-16 DIAGNOSIS — G4733 Obstructive sleep apnea (adult) (pediatric): Secondary | ICD-10-CM | POA: Diagnosis not present

## 2021-01-22 DIAGNOSIS — G4733 Obstructive sleep apnea (adult) (pediatric): Secondary | ICD-10-CM | POA: Diagnosis not present

## 2021-01-24 ENCOUNTER — Other Ambulatory Visit: Payer: Self-pay | Admitting: Family Medicine

## 2021-04-16 NOTE — Progress Notes (Signed)
Phone: [phone]   Subjective:  Patient presents today for their annual physical. Chief complaint-noted.   See problem oriented charting- ROS- full  review of systems was completed and negative  except for: some fatigue and gets perhaps mildly winded with activity but reports not exercising at all  The following were reviewed and entered/updated in epic: Past Medical History:  Diagnosis Date   Anal fissure    Anxiety    Depression    DVT (deep venous thrombosis) ([place])    Hypertension, essential 12/05/2017   Hypogonadism male    Multinodular goiter    Pulmonary embolism ([place])    Sleep apnea with use of continuous positive airway pressure (CPAP)    Tubular adenoma of colon    Ulcerative colitis, unspecified    Patient Active Problem List   Diagnosis Date Noted   Memory difficulties 03/10/2017    Priority: High   Hypercoagulable state ([place]) 04/04/2015    Priority: High   Thyroid nodule 02/01/2014    Priority: High   Chronic pulmonary embolism ([place])- see discharge summary 01/2014. ? related to testosterone therapy 01/31/2014    Priority: High   Ulcerative colitis ([place]) 03/04/1992    Priority: High   Hypertension, essential 12/05/2017    Priority: Medium    Obstructive sleep apnea 07/03/2016    Priority: Medium    History of DVT (deep vein thrombosis) 02/10/2014    Priority: Medium    Hypogonadism in male 01/15/2007    Priority: Medium    Major depression in full remission ([place]) 01/15/2007    Priority: Medium    Hyperlipemia 01/09/2007    Priority: Medium    Nasal septal deformity 05/09/2012    Priority: Low    Class: Acute   NEOP, MALIGNANT, SKIN, TRUNK 10/06/2006    Priority: Low   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   Personal history of pulmonary embolism 08/03/2020   Depression, major, single episode, in partial remission ([place]) 01/15/2007   Past Surgical History:  Procedure Laterality Date   MANDIBULAR HARDWARE REMOVAL Bilateral 07/10/2012    Procedure: BILATERAL MANDIBULAR HARDWARE REMOVAL;  Surgeon: Jerrell Belfast, MD;  Location: [facility] OR;  Service: ENT;  Laterality: Bilateral;   ORIF MANDIBULAR FRACTURE Left 05/09/2012   Procedure: OPEN REDUCTION INTERNAL FIXATION (ORIF) RIGHT ANTERIOR MANDIBULAR FRACTURE; Open Reduction Internal Fixation of Midface fracture; Reduction of septal fracture; arch bars; Repair of lacerations;  Surgeon: Jerrell Belfast, MD;  Location: [facility] OR;  Service: ENT;  Laterality: Left;   TRACHEOSTOMY TUBE PLACEMENT N/A 05/09/2012   Procedure: TRACHEOSTOMY;  Surgeon: Jerrell Belfast, MD;  Location: [facility] OR;  Service: ENT;  Laterality: N/A;    Family History  Problem Relation Age of Onset   Arthritis Mother    Irritable bowel syndrome Mother    COPD Father        lost dad in 2019   Healthy Brother    Healthy Brother    Lung cancer Paternal Grandmother    Colon cancer Neg Hx    Esophageal cancer Neg Hx    Rectal cancer Neg Hx     Medications- reviewed and updated Current Outpatient Medications  Medication Sig Dispense Refill   sertraline (ZOLOFT) 25 MG tablet TAKE 1 TABLET BY MOUTH EVERY DAY 90 tablet 3   tadalafil (CIALIS) 20 MG tablet TAKE 1/2 TO 1 TABLET BY MOUTH EVERY OTHER DAY AS NEEDED FOR ERECTILE DYSFUNCTION 6 tablet 5   testosterone (ANDROGEL) 50 MG/5GM (1%) GEL APPLY 1-1/2 PACKETS ONCE DAILY on shoulder area 300  g 2   rivaroxaban (XARELTO) 20 MG TABS tablet Take 1 tablet (20 mg total) by mouth daily. 90 tablet 3   No current facility-administered medications for this visit.    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    Objective  Objective:  BP 124/82    Pulse 62    Temp 97.9 F (36.6 C)    Ht 5' 8"  (1.727 m)    Wt 232 lb 9.6 oz  (105.5 kg)    SpO2 97%    BMI 35.37 kg/m  Gen: NAD, resting comfortably HEENT: Mucous membranes are moist. Oropharynx normal Neck: no thyromegaly CV: RRR no murmurs rubs or gallops Lungs: CTAB no crackles, wheeze, rhonchi Abdomen: soft/nontender/nondistended/normal bowel sounds. No rebound or guarding.  Ext: no edema Skin: warm, dry Neuro: grossly normal, moves all extremities, PERRLA Msk: pain over bilateral CMC joints- suspect arthritis    Assessment and Plan  56 y.o. male presenting for annual physical.  Health Maintenance counseling: 1. Anticipatory guidance: Patient counseled regarding regular dental exams -q6 months, eye exams -yearly,  avoiding smoking and second hand smoke , limiting alcohol to 2 beverages per day-rare to never in last few years.  No illicit drugs. - 2. Risk factor reduction:  Advised patient of need for regular exercise and diet rich and fruits and vegetables to reduce risk of heart attack and stroke.  Exercise-walks dogs some- wanted to get back to [facility] as of last year but has not made it- strongly encouraged to restart exercise. Has been really busy with moving and has been hard for him- goal is 150 mins a week- build up to it Diet/weight management--weight down 2 pounds from last year - states actually lost 15 lbs before regaining some- cut down on  carbs- has slipped up on this but plans to restart Wt Readings from Last 3 Encounters:  04/26/21 232 lb 9.6 oz (105.5 kg)  06/21/20 236 lb 1.6 oz (107.1 kg)  04/20/20 234 lb 3.2 oz (106.2 kg)  3. Immunizations/screenings/ancillary studies DISCUSSED:  -Shingrix vaccine #1- wants to hold off for now -Flu vaccine (last one 12/2017) - declines for year as well as COVID shot Immunization History  Administered Date(s) Administered   DTP 10/06/2006   Influenza,inj,Quad PF,6+ Mos 02/02/2014, 12/18/2017   Influenza-Unspecified 02/02/2014, 11/03/2014, 12/02/2017, 12/18/2017   Td 03/05/1991, 10/06/2006   Tdap  03/05/1991, 10/06/2006, 05/09/2012   4. Prostate cancer screening- with testosterone gel we will trend PSA  - Low risk on last check Lab Results  Component Value Date   PSA 1.46 05/04/2020   PSA 1.62 06/28/2019   PSA 2.65 12/28/2018   5. Colon cancer screening - 05/21/2019 with 2 year repeat planned due to 2 tubular adenomatous and mildly active colitis- about to be due 6. Skin cancer screening-- no dermatologist. advised regular sunscreen use. Denies worrisome, changing, or new skin lesions.  7. Smoking associated screening (lung cancer screening, AAA screen 65-75, UA)- never smoker 8. STD screening - he declines- no concerns about infidelity    Status of chronic or acute concerns   #social update- getting married this summer! Moved in with fiancee. 2 dogs doing well  # Ulcerative colitis-follows with Dr. Fuller Plan S: Last colonoscopy March 2021  with plan for 2-year repeat -Patient has opted to come off Lialda/mesalamine-recommendations per GI were to continue  - denies rectal bleeding or blood in the stool  A/P: appears stable without meds- continue to monitor   #Hypercoagulable state/chronic PE S: Medication: Chronic Xarelto 20 mg.   History: Left leg DVT 02/01/2014 and also with pulmonary embolism and then later had superficial clot in the left GSV but with potential to extend to distal system noted 01/06/2015 so we have opted for lifelong therapy.  Symptoms:no calf pain or swelling. Some mild SOB but has been sedentary  A/P: Overall stable without new symptoms-needs to continue current medication  # Low testosterone-patient managed by Dr. Buddy Duty of [facility] endocrinology S:remains on testosterone gel   A/P: Patient reports has been stable at discussed checking testosterone opts in-we do need to check a PSA   # Depression S: Medication: Zoloft 25Mg.  Depression screen [facility] 2/9 04/26/2021 04/20/2020 04/20/2020  Decreased Interest 0 0 0  Down, Depressed, Hopeless 0 0 0  PHQ - 2 Score 0 0 0   Altered sleeping 0 0 -  Tired, decreased energy 1 0 -  Change in appetite 0 0 -  Feeling bad or failure about yourself  0 0 -  Trouble concentrating 0 0 -  Moving slowly or fidgety/restless 0 0 -  Suicidal thoughts 0 0 -  PHQ-9 Score 1 0 -  Difficult doing work/chores Not difficult at all Not difficult at all -  Some recent data might be hidden  A/P: Full remission-continue current medication  #hyperlipidemia S: Medication:None -10-year ASCVD risk slightly above 7.5% in the past -He preferred to work on lifestyle. Had declined coronary calcium scoring Lab Results  Component Value Date   CHOL 192 05/04/2020   HDL 28.30 (L) 05/04/2020   LDLCALC 151 (H) 05/10/2013   LDLDIRECT 97.0 05/04/2020   TRIG (H) 05/04/2020    468.0 Triglyceride is over 400; calculations on Lipids are invalid.   CHOLHDL 7 05/04/2020   A/P: The 10-year ASCVD risk score (Arnett DK, et al., 2019) is: 7.1% based on current endpoints..  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 Tylenol helps though doesn't tend to help him in other areas- feels nsaids have not helped - plus should avoid on xarelto -he plans to file [facility] claim -considered doing x-rays but he is going to discuss with [facility]  #has reported memory issues for years- has not worsened- trying prevagen and feels slight improvement  #has felt more fatigued last several days- has not done home covid test. Has had more work stress and  thinks that is most likely cause  Recommended follow up: Return in about 1 year (around 04/26/2022) for physical or sooner if needed.  Lab/Order associations: fasting   ICD-10-CM   1. Preventative health care  Z00.00 Testosterone    CBC with Differential/Platelet    Comprehensive metabolic panel    Lipid panel    PSA    TSH  2. Hypertension, essential  I10     3. Depression, major, single episode, in partial remission ([place])  F32.4     4. Hypercoagulable state ([place])  D68.59     5. 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 Other chronic pulmonary embolism, unspecified whether acute cor pulmonale present (HCC)  I27.82     7. Hyperlipidemia, unspecified hyperlipidemia type  E78.5 CBC with Differential/Platelet    Comprehensive metabolic panel    Lipid panel    TSH    8. 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 Screening for prostate cancer  Z12.5 PSA      Meds ordered this encounter  Medications   rivaroxaban (XARELTO) 20 MG TABS tablet    Sig: Take 1 tablet (20 mg total) by mouth daily.    Dispense:  90 tablet    Refill:  3    I,Jada Bradford,acting as a scribe for Garret Reddish, MD.,have documented all relevant documentation on the behalf of Garret Reddish, MD,as directed by  Garret Reddish, MD while in the presence of Garret Reddish, MD.  I, Garret Reddish, MD, have reviewed all documentation for this visit. The documentation on 04/26/21 for the exam, diagnosis, procedures, and orders are all accurate and complete.  Return precautions advised.  Garret Reddish, MD

## 2021-04-23 DIAGNOSIS — G4733 Obstructive sleep apnea (adult) (pediatric): Secondary | ICD-10-CM | POA: Diagnosis not present

## 2021-04-26 ENCOUNTER — Encounter: Payer: Self-pay | Admitting: Family Medicine

## 2021-04-26 ENCOUNTER — Ambulatory Visit (INDEPENDENT_AMBULATORY_CARE_PROVIDER_SITE_OTHER): Payer: BC Managed Care – PPO | Admitting: Family Medicine

## 2021-04-26 ENCOUNTER — Other Ambulatory Visit: Payer: Self-pay

## 2021-04-26 VITALS — BP 124/82 | HR 62 | Temp 97.9°F | Ht 68.0 in | Wt 232.6 lb

## 2021-04-26 DIAGNOSIS — Z0000 Encounter for general adult medical examination without abnormal findings: Secondary | ICD-10-CM

## 2021-04-26 DIAGNOSIS — F324 Major depressive disorder, single episode, in partial remission: Secondary | ICD-10-CM

## 2021-04-26 DIAGNOSIS — I10 Essential (primary) hypertension: Secondary | ICD-10-CM

## 2021-04-26 DIAGNOSIS — E785 Hyperlipidemia, unspecified: Secondary | ICD-10-CM | POA: Diagnosis not present

## 2021-04-26 DIAGNOSIS — D6859 Other primary thrombophilia: Secondary | ICD-10-CM

## 2021-04-26 DIAGNOSIS — E230 Hypopituitarism: Secondary | ICD-10-CM | POA: Diagnosis not present

## 2021-04-26 DIAGNOSIS — K51311 Ulcerative (chronic) rectosigmoiditis with rectal bleeding: Secondary | ICD-10-CM

## 2021-04-26 DIAGNOSIS — Z125 Encounter for screening for malignant neoplasm of prostate: Secondary | ICD-10-CM | POA: Diagnosis not present

## 2021-04-26 DIAGNOSIS — I2782 Chronic pulmonary embolism: Secondary | ICD-10-CM

## 2021-04-26 LAB — COMPREHENSIVE METABOLIC PANEL
ALT: 36 U/L (ref 0–53)
AST: 25 U/L (ref 0–37)
Albumin: 4.4 g/dL (ref 3.5–5.2)
Alkaline Phosphatase: 57 U/L (ref 39–117)
BUN: 16 mg/dL (ref 6–23)
CO2: 31 mEq/L (ref 19–32)
Calcium: 9.5 mg/dL (ref 8.4–10.5)
Chloride: 100 mEq/L (ref 96–112)
Creatinine, Ser: 1.08 mg/dL (ref 0.40–1.50)
GFR: 78.48 mL/min (ref >60.00)
Glucose, Bld: 91 mg/dL (ref 70–99)
Potassium: 4.3 mEq/L (ref 3.5–5.1)
Sodium: 138 mEq/L (ref 135–145)
Total Bilirubin: 1.1 mg/dL (ref 0.2–1.2)
Total Protein: 7.2 g/dL (ref 6.0–8.3)

## 2021-04-26 LAB — CBC WITH DIFFERENTIAL/PLATELET
Basophils Absolute: 0 10*3/uL (ref 0.0–0.1)
Basophils Relative: 0.7 % (ref 0.0–3.0)
Eosinophils Absolute: 0.1 10*3/uL (ref 0.0–0.7)
Eosinophils Relative: 1.9 % (ref 0.0–5.0)
HCT: 46.8 % (ref 39.0–52.0)
Hemoglobin: 15.6 g/dL (ref 13.0–17.0)
Lymphocytes Relative: 42.4 % (ref 12.0–46.0)
Lymphs Abs: 2.3 10*3/uL (ref 0.7–4.0)
MCHC: 33.3 g/dL (ref 30.0–36.0)
MCV: 89.4 fl (ref 78.0–100.0)
Monocytes Absolute: 0.7 10*3/uL (ref 0.1–1.0)
Monocytes Relative: 12.7 % — ABNORMAL HIGH (ref 3.0–12.0)
Neutro Abs: 2.3 10*3/uL (ref 1.4–7.7)
Neutrophils Relative %: 42.3 % — ABNORMAL LOW (ref 43.0–77.0)
Platelets: 200 10*3/uL (ref 150.0–400.0)
RBC: 5.23 Mil/uL (ref 4.22–5.81)
RDW: 14.4 % (ref 11.5–15.5)
WBC: 5.4 10*3/uL (ref 4.0–10.5)

## 2021-04-26 LAB — LIPID PANEL
Cholesterol: 199 mg/dL (ref 0–200)
HDL: 30.6 mg/dL — ABNORMAL LOW (ref >39.00)
NonHDL: 168.61
Total CHOL/HDL Ratio: 7
Triglycerides: 318 mg/dL — ABNORMAL HIGH (ref 0.0–149.0)
VLDL: 63.6 mg/dL — ABNORMAL HIGH (ref 0.0–40.0)

## 2021-04-26 LAB — LDL CHOLESTEROL, DIRECT: Direct LDL: 122 mg/dL

## 2021-04-26 LAB — PSA: PSA: 2.47 ng/mL (ref 0.10–4.00)

## 2021-04-26 LAB — TSH: TSH: 1.58 u[IU]/mL (ref 0.35–5.50)

## 2021-04-26 LAB — TESTOSTERONE: Testosterone: 442.6 ng/dL (ref 300.00–890.00)

## 2021-04-26 MED ORDER — RIVAROXABAN 20 MG PO TABS: 20.0000 mg | ORAL_TABLET | Freq: Every day | ORAL | 3 refills | Status: DC

## 2021-04-26 NOTE — Patient Instructions (Addendum)
Please stop by lab before you go If you have mychart- we will send your results within 3 business days of [place] receiving them.  If you do not have mychart- we will call you about results within 5 business days of [place] receiving them.  *please also note that you will see labs on mychart as soon as they post. I will later go in and write notes on them- will say "notes from Dr. Yong Channel"   Team under fatigue due rapid covid test- we are also checking labs. Doubt covid.   [facility] contact Please call to schedule next colonoscopy for march 2023 Address: [address], [place], [place] [address] Phone: [phone]   Recommended follow up: Return in about 1 year (around 04/26/2022) for physical or sooner if needed.

## 2021-05-02 ENCOUNTER — Other Ambulatory Visit: Payer: Self-pay

## 2021-05-02 DIAGNOSIS — R7989 Other specified abnormal findings of blood chemistry: Secondary | ICD-10-CM

## 2021-05-15 DIAGNOSIS — G4733 Obstructive sleep apnea (adult) (pediatric): Secondary | ICD-10-CM | POA: Diagnosis not present

## 2021-05-22 IMAGING — MR MRI HEAD WITHOUT CONTRAST
10 of 11 series · 43 of 48 positions shown · non-contrast
Comparison: Head CT same day. MRI 03/30/2018.

CLINICAL DATA: Double vision beginning yesterday and persisting
today.

EXAM:
MRI HEAD WITHOUT CONTRAST
TECHNIQUE: Multiplanar, multiecho pulse sequences of the brain and surrounding
structures were obtained without intravenous contrast.

[Series 5: DWI · axial · 3.0mm · 0.92mm/px · z∈[-42,+97]mm · 10 of 96 slices shown (1 of 4)]
[im 1/96]
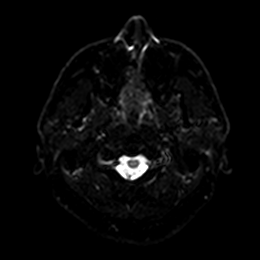
[im 11/96]
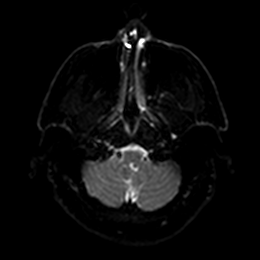
[im 22/96]
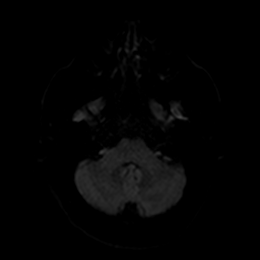
[im 32/96]
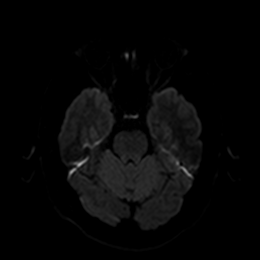
[im 43/96]
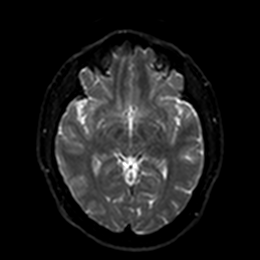
[im 53/96]
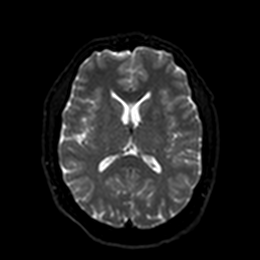
[im 64/96]
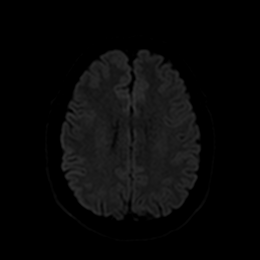
[im 74/96]
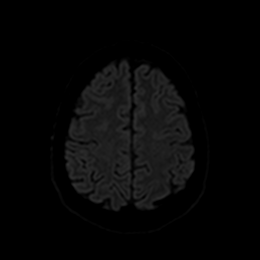
[im 85/96]
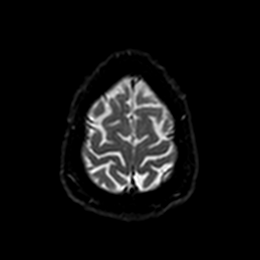
[im 96/96]
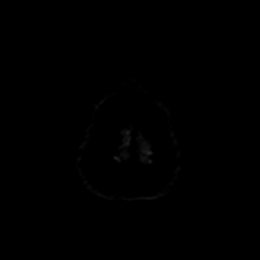

[Series 6: DWI · axial · 3.0mm · 0.92mm/px · z∈[-42,+97]mm · 4 of 47 slices shown (2 of 4)]
[im 1/47]
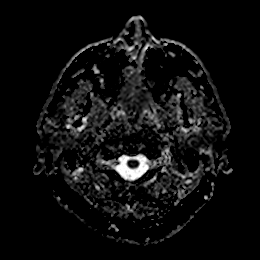
[im 16/47]
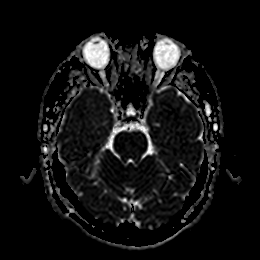
[im 31/47]
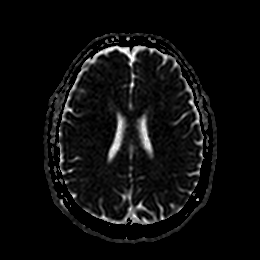
[im 47/47]
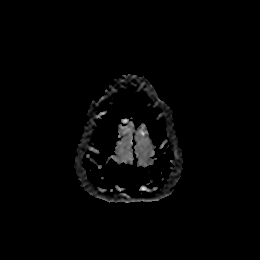

[Series 7: DWI · coronal · 4.0mm · 0.88mm/px · 7 of 72 slices shown (3 of 4)]
[im 1/72]
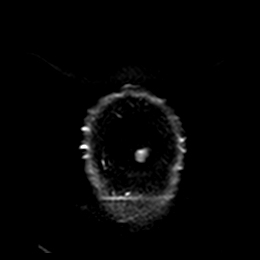
[im 12/72]
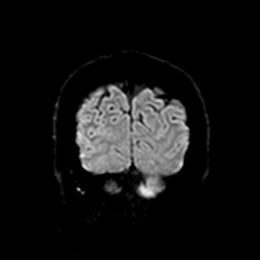
[im 24/72]
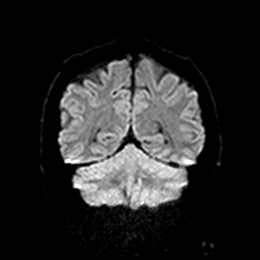
[im 36/72]
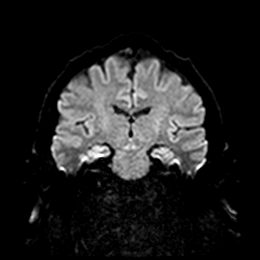
[im 48/72]
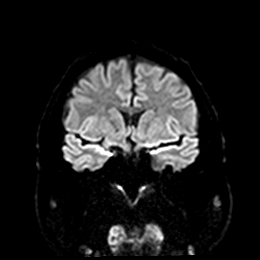
[im 60/72]
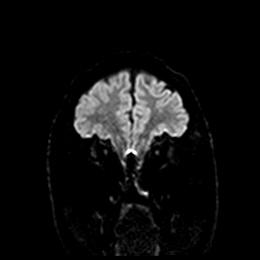
[im 72/72]
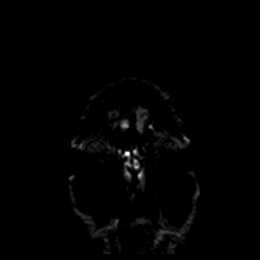

[Series 8: DWI · coronal · 4.0mm · 0.88mm/px · 3 of 36 slices shown (4 of 4)]
[im 1/36]
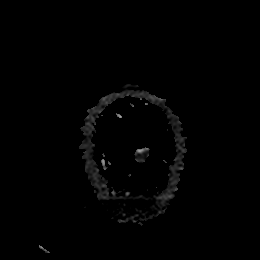
[im 18/36]
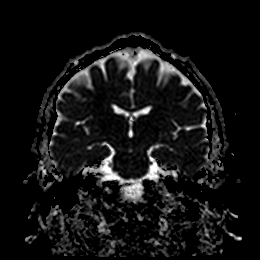
[im 36/36]
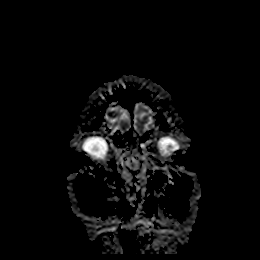

[Series 9: T1 · sagittal · 5.0mm · 0.75mm/px · 2 of 23 slices shown]
[im 1/23]
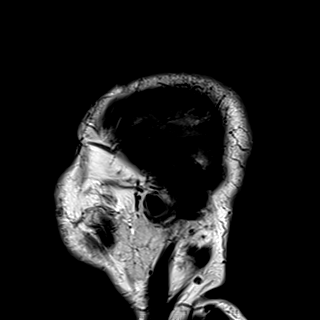
[im 23/23]
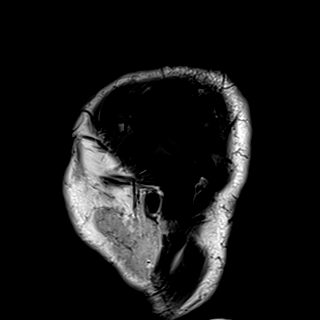

[Series 10: T2 · axial · 5.0mm · 0.72mm/px · z∈[-45,+97]mm · 2 of 25 slices shown (1 of 2)]
[im 1/25]
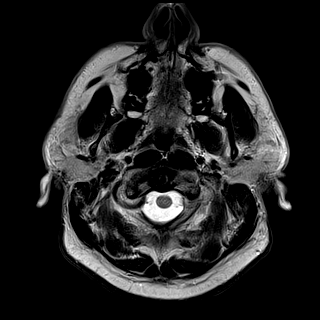
[im 25/25]
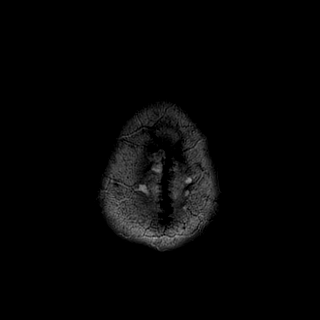

[Series 11: FLAIR · axial · 5.0mm · 0.45mm/px · z∈[-45,+97]mm · 2 of 25 slices shown]
[im 1/25]
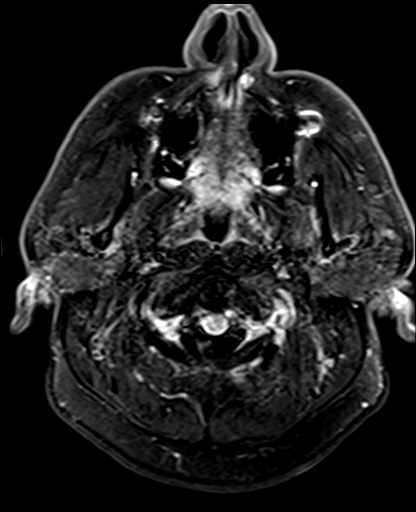
[im 25/25]
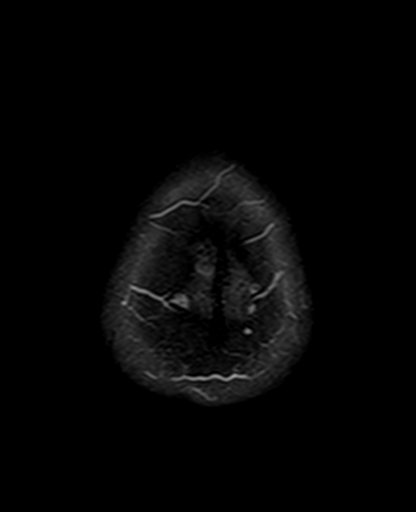

[Series 13: pha_images · axial · 3.0mm · 0.90mm/px · z∈[-56,+107]mm · 5 of 56 slices shown]
[im 1/56]
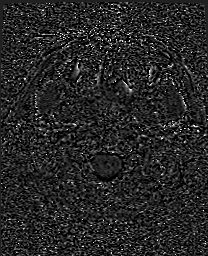
[im 14/56]
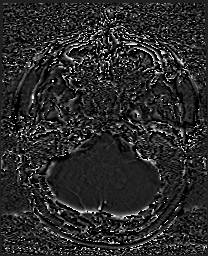
[im 28/56]
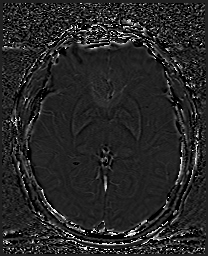
[im 42/56]
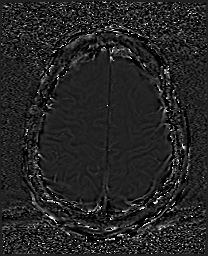
[im 56/56]
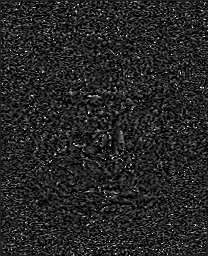

[Series 14: swi_images · axial · 3.0mm · 0.90mm/px · z∈[-56,+107]mm · 5 of 56 slices shown]
[im 1/56]
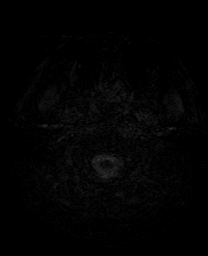
[im 14/56]
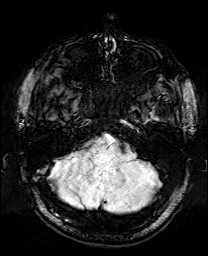
[im 28/56]
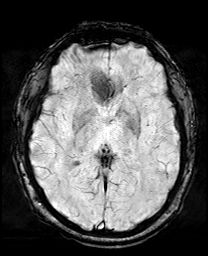
[im 42/56]
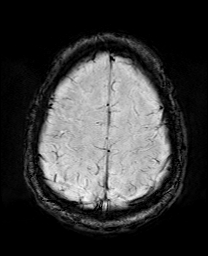
[im 56/56]
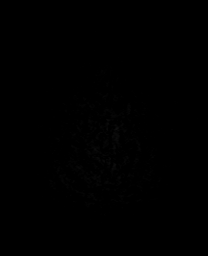

[Series 17: T2 · coronal · 5.0mm · 0.34mm/px · 3 of 29 slices shown (2 of 2)]
[im 1/29]
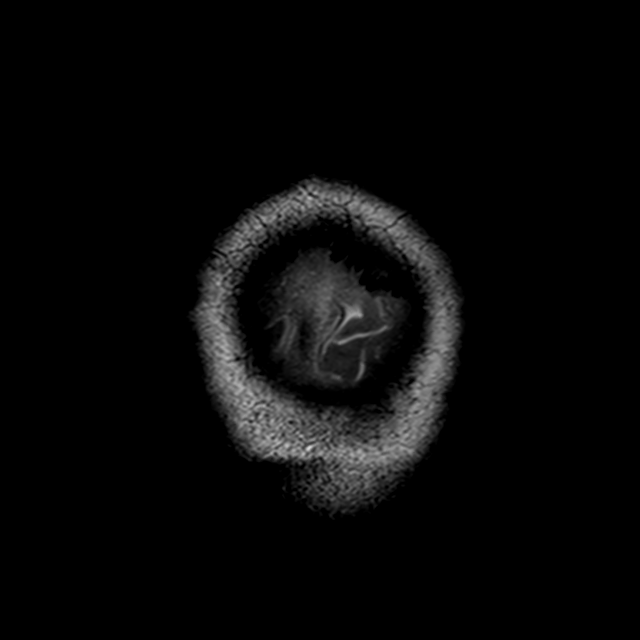
[im 15/29]
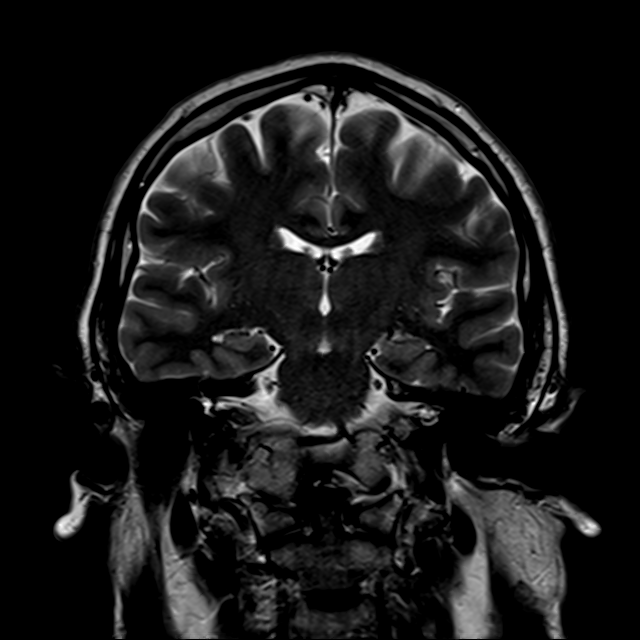
[im 29/29]
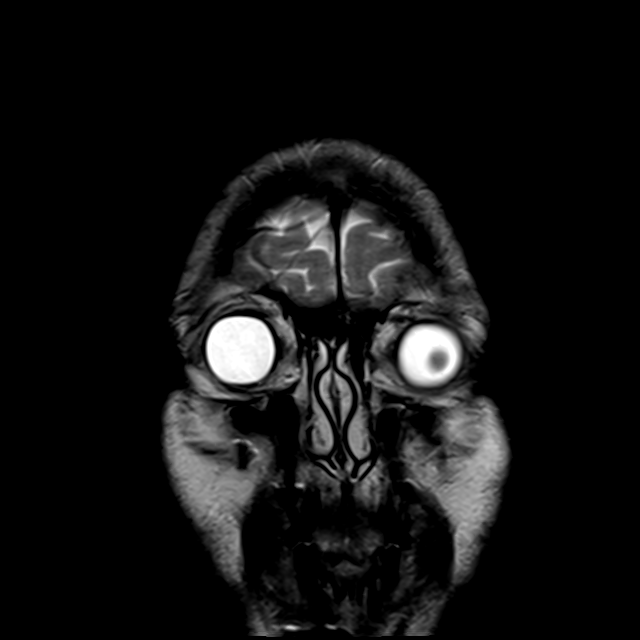

[43 of 48 positions shown; findings below may reference images not displayed]

FINDINGS: Brain: Diffusion imaging does not show any acute or subacute
infarction. The brainstem and cerebellum are normal. Cerebral
hemispheres are normal. No small or large vessel insult. No mass
lesion, hemorrhage, hydrocephalus or extra-axial collection. No
pituitary mass. Cavernous sinus regions appear normal.

Vascular: Major vessels at the base of the brain show flow.

Skull and upper cervical spine: Negative

Sinuses/Orbits: Clear/normal

Other: None
IMPRESSION: Normal examination.  No abnormality seen to explain double vision.

## 2021-07-23 DIAGNOSIS — G4733 Obstructive sleep apnea (adult) (pediatric): Secondary | ICD-10-CM | POA: Diagnosis not present

## 2021-08-09 ENCOUNTER — Encounter: Payer: Self-pay | Admitting: Family Medicine

## 2021-08-14 ENCOUNTER — Encounter: Payer: Self-pay | Admitting: Family Medicine

## 2021-08-14 ENCOUNTER — Ambulatory Visit (INDEPENDENT_AMBULATORY_CARE_PROVIDER_SITE_OTHER): Payer: BC Managed Care – PPO | Admitting: Family Medicine

## 2021-08-14 VITALS — BP 124/70 | HR 88 | Temp 98.3°F | Ht 68.0 in | Wt 227.8 lb

## 2021-08-14 DIAGNOSIS — R1013 Epigastric pain: Secondary | ICD-10-CM

## 2021-08-14 DIAGNOSIS — R5383 Other fatigue: Secondary | ICD-10-CM | POA: Diagnosis not present

## 2021-08-14 DIAGNOSIS — K51311 Ulcerative (chronic) rectosigmoiditis with rectal bleeding: Secondary | ICD-10-CM | POA: Diagnosis not present

## 2021-08-14 DIAGNOSIS — D6859 Other primary thrombophilia: Secondary | ICD-10-CM

## 2021-08-14 DIAGNOSIS — F3342 Major depressive disorder, recurrent, in full remission: Secondary | ICD-10-CM

## 2021-08-14 DIAGNOSIS — I2782 Chronic pulmonary embolism: Secondary | ICD-10-CM

## 2021-08-14 NOTE — Progress Notes (Signed)
Phone [phone] In person visit   Subjective:   [name] is a 56 y.o. year old very pleasant male patient who presents for/with See problem oriented charting Chief Complaint  Patient presents with   fatigue and muscle aches    Pt c/o fatigue and muscle aches x3 weeks.   light color stool    Pt c/o light color stool and gas and c/o epigastric tweaking with mild diarrhea.   Past Medical History-  Patient Active Problem List   Diagnosis Date Noted   Memory difficulties 03/10/2017    Priority: High   Hypercoagulable state ([place]) 04/04/2015    Priority: High   Thyroid nodule 02/01/2014    Priority: High   Chronic pulmonary embolism ([place])- see discharge summary 01/2014. ? related to testosterone therapy 01/31/2014    Priority: High   Ulcerative colitis ([place]) 03/04/1992    Priority: High   Hypertension, essential 12/05/2017    Priority: Medium    Obstructive sleep apnea 07/03/2016    Priority: Medium    History of DVT (deep vein thrombosis) 02/10/2014    Priority: Medium    Hypogonadism in male 01/15/2007    Priority: Medium    Major depression in full remission ([place]) 01/15/2007    Priority: Medium    Hyperlipemia 01/09/2007    Priority: Medium    Nasal septal deformity 05/09/2012    Priority: Low    Class: Acute   NEOP, MALIGNANT, SKIN, TRUNK 10/06/2006    Priority: Low   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 90 tablet 3   sertraline (ZOLOFT) 25 MG tablet TAKE 1 TABLET BY MOUTH EVERY DAY 90 tablet 3   tadalafil (CIALIS) 20 MG tablet TAKE 1/2 TO 1 TABLET BY MOUTH EVERY OTHER DAY AS NEEDED FOR ERECTILE DYSFUNCTION 6 tablet 5   testosterone  (ANDROGEL) 50 MG/5GM (1%) GEL APPLY 1-1/2 PACKETS ONCE DAILY on shoulder area 300 g 2   No current facility-administered medications for this visit.     Objective:  BP 124/70   Pulse 88   Temp 98.3 F (36.8 C)   Ht 5' 8"  (1.727 m)   Wt 227 lb 12.8 oz (103.3 kg)   SpO2 96%   BMI 34.64 kg/m  Gen: NAD, resting comfortably CV: RRR no murmurs rubs or gallops Lungs: CTAB no crackles, wheeze, rhonchi Abdomen: soft/nontender/nondistended/normal bowel sounds. No rebound or guarding.  Ext: trace edema bilaterally  Skin: warm, dry     Assessment and Plan    #Fatigue/muscle aches/Stomach changes S: Has noted issues for about 3 weeks  He is also noted lighter colored stool  (started to get better) as well as increased gas (slightly improving) and some intermittent twinges epigastric discomfort with mild diarrhea.  No blood in stool reported.  No melena/dark black stool. Has noted some thoracic back discomfort- more achy/tired and feels like he needs to stretch.  A/P: Multiple GI changes and also some thoracic back discomfort all started around the same time as well as epigastric intermittent tenderness. Exam benignWe opted to update labs. With looser stools check on pancreas as well as with epigastric pain. For fatigue check TSH. See below also needs to see GI at this time    %  Ulcerative colitis-follows with Dr. Fuller Plan- but wants to establish in [place] (where he lives) S: Last colonoscopy March 2021 with plan for 2-year repeat -Patient has opted to come off Lialda/mesalamine-recommendations per GI were to continue  A/P: has done well without meds- needs to reestablish with GI and wants to see locally in [place] where he lived- new referral placed today   #Hypercoagulable state/chronic PE S: Medication: Chronic Xarelto 20 mg.   History: Left leg DVT 02/01/2014 and also with pulmonary embolism and then later had superficial clot in the left GSV but with potential to extend to distal system  noted 01/06/2015 so we have opted for lifelong therapy.  Symptoms: no increased edema or shortness of breath  A/P: Chronic PE/DVT history - controlled/well maintained with Xarelto 20 mg.  Chronic hypercoagulable state with recurrent symptoms we will remain on medicine long-term.  % Low testosterone-patient managed by Dr. Buddy Duty of [facility] endocrinology S:remains on testosterone gel   Lab Results  Component Value Date   TESTOSTERONE 442.60 04/26/2021  A/P: has been stable- continue current meds  # Depression S: Medication: Zoloft 25Mg.     08/14/2021    3:32 PM 04/26/2021    8:00 AM 04/20/2020    3:58 PM  Depression screen PHQ 2/9  Decreased Interest 0 0 0  Down, Depressed, Hopeless 0 0 0  PHQ - 2 Score 0 0 0  Altered sleeping 0 0 0  Tired, decreased energy 0 1 0  Change in appetite 0 0 0  Feeling bad or failure about yourself  0 0 0  Trouble concentrating 0 0 0  Moving slowly or fidgety/restless 0 0 0  Suicidal thoughts 0 0 0  PHQ-9 Score 0 1 0  Difficult doing work/chores Not difficult at all Not difficult at all Not difficult at all  A/P: full remission- continue current meds   #down another 5 lbs- eating healthier  Recommended follow up: Return in about 9 months (around 05/15/2022) for physical or sooner if needed.Schedule b4 you leave.  Lab/Order associations:   ICD-10-CM   1. Fatigue, unspecified type  R53.83 TSH    2. 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 Epigastric abdominal pain  R10.13 Lipase    Amylase    4. Hypercoagulable state ([place])  D68.59     5. Other chronic pulmonary embolism, unspecified whether acute cor pulmonale present (HCC)  I27.82     6. Recurrent major depressive disorder, in full remission ([place])  F33.42       No orders of the defined types were placed in this encounter.   Return  precautions advised.  Garret Reddish, MD

## 2021-08-14 NOTE — Patient Instructions (Addendum)
Health Maintenance Due  Topic Date Due   COLONOSCOPY (Pts [phone] Insurance coverage will need to be confirmed)  05/20/2021  We will call you within two weeks about your referral to [facility] [place] If you do not hear within 2 weeks, give [place] call.   If worsening symptoms please let [place]now  Please stop by lab before you go If you have mychart- we will send your results within 3 business days of [place] them.  If you do not have mychart- we will call you about results within 5 business days of [place] them.  *please also note that you will see labs on mychart as soon as they post. I will later go in and write notes on them- will say "notes from Dr. HYong Channel   Recommended follow up: Return in about 9 months (around 05/15/2022) for physical or sooner if needed.Schedule b4 you leave.

## 2021-08-15 LAB — CBC WITH DIFFERENTIAL/PLATELET
Basophils Absolute: 0 10*3/uL (ref 0.0–0.1)
Basophils Relative: 0.4 % (ref 0.0–3.0)
Eosinophils Absolute: 0.1 10*3/uL (ref 0.0–0.7)
Eosinophils Relative: 1.4 % (ref 0.0–5.0)
HCT: 47.1 % (ref 39.0–52.0)
Hemoglobin: 16 g/dL (ref 13.0–17.0)
Lymphocytes Relative: 37.4 % (ref 12.0–46.0)
Lymphs Abs: 2.7 10*3/uL (ref 0.7–4.0)
MCHC: 34.1 g/dL (ref 30.0–36.0)
MCV: 89.1 fl (ref 78.0–100.0)
Monocytes Absolute: 0.9 10*3/uL (ref 0.1–1.0)
Monocytes Relative: 12.7 % — ABNORMAL HIGH (ref 3.0–12.0)
Neutro Abs: 3.4 10*3/uL (ref 1.4–7.7)
Neutrophils Relative %: 48.1 % (ref 43.0–77.0)
Platelets: 207 10*3/uL (ref 150.0–400.0)
RBC: 5.29 Mil/uL (ref 4.22–5.81)
RDW: 13.5 % (ref 11.5–15.5)
WBC: 7.1 10*3/uL (ref 4.0–10.5)

## 2021-08-15 LAB — COMPREHENSIVE METABOLIC PANEL
ALT: 27 U/L (ref 0–53)
AST: 22 U/L (ref 0–37)
Albumin: 4.5 g/dL (ref 3.5–5.2)
Alkaline Phosphatase: 58 U/L (ref 39–117)
BUN: 16 mg/dL (ref 6–23)
CO2: 28 mEq/L (ref 19–32)
Calcium: 9.8 mg/dL (ref 8.4–10.5)
Chloride: 101 mEq/L (ref 96–112)
Creatinine, Ser: 1.06 mg/dL (ref 0.40–1.50)
GFR: 80.09 mL/min (ref >60.00)
Glucose, Bld: 83 mg/dL (ref 70–99)
Potassium: 4.1 mEq/L (ref 3.5–5.1)
Sodium: 138 mEq/L (ref 135–145)
Total Bilirubin: 0.9 mg/dL (ref 0.2–1.2)
Total Protein: 7.7 g/dL (ref 6.0–8.3)

## 2021-08-15 LAB — LIPASE: Lipase: 29 U/L (ref 11.0–59.0)

## 2021-08-15 LAB — SEDIMENTATION RATE: Sed Rate: 26 mm/hr — ABNORMAL HIGH (ref 0–20)

## 2021-08-15 LAB — AMYLASE: Amylase: 38 U/L (ref 27–131)

## 2021-08-15 LAB — TSH: TSH: 1.38 u[IU]/mL (ref 0.35–5.50)

## 2021-08-26 ENCOUNTER — Other Ambulatory Visit: Payer: Self-pay | Admitting: Family Medicine

## 2021-10-22 DIAGNOSIS — G4733 Obstructive sleep apnea (adult) (pediatric): Secondary | ICD-10-CM | POA: Diagnosis not present

## 2021-10-26 ENCOUNTER — Encounter: Payer: Self-pay | Admitting: Family Medicine

## 2021-10-26 ENCOUNTER — Ambulatory Visit (INDEPENDENT_AMBULATORY_CARE_PROVIDER_SITE_OTHER): Payer: BC Managed Care – PPO | Admitting: Family Medicine

## 2021-10-26 VITALS — BP 110/78 | HR 69 | Temp 98.3°F | Ht 68.0 in | Wt 230.6 lb

## 2021-10-26 DIAGNOSIS — R41840 Attention and concentration deficit: Secondary | ICD-10-CM

## 2021-10-26 DIAGNOSIS — R413 Other amnesia: Secondary | ICD-10-CM

## 2021-10-26 NOTE — Progress Notes (Signed)
Phone [phone] In person visit   Subjective:   [name] is a 56 y.o. year old very pleasant male patient who presents for/with See problem oriented charting Chief Complaint  Patient presents with   Depression    Pt would like to discuss sertraline  he thinks it is causing focus/memory/thinking problems. He feels like his brain is "sleepy" this has been going on for a while and now he feels like it is affecting his job.   Past Medical History-  Patient Active Problem List   Diagnosis Date Noted   Memory difficulties 03/10/2017    Priority: High   Hypercoagulable state ([place]) 04/04/2015    Priority: High   Thyroid nodule 02/01/2014    Priority: High   Chronic pulmonary embolism ([place])- see discharge summary 01/2014. ? related to testosterone therapy 01/31/2014    Priority: High   Ulcerative colitis ([place]) 03/04/1992    Priority: High   Hypertension, essential 12/05/2017    Priority: Medium    Obstructive sleep apnea 07/03/2016    Priority: Medium    History of DVT (deep vein thrombosis) 02/10/2014    Priority: Medium    Hypogonadism in male 01/15/2007    Priority: Medium    Major depression in full remission ([place]) 01/15/2007    Priority: Medium    Hyperlipemia 01/09/2007    Priority: Medium    Nasal septal deformity 05/09/2012    Priority: Low    Class: Acute   NEOP, MALIGNANT, SKIN, TRUNK 10/06/2006    Priority: Low   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 90 tablet 3   sertraline (ZOLOFT) 25 MG tablet TAKE 1 TABLET BY MOUTH EVERY DAY 90 tablet 3   tadalafil (CIALIS) 20 MG tablet TAKE 1/2 TO 1 TABLET BY MOUTH EVERY OTHER DAY AS NEEDED FOR  ERECTILE DYSFUNCTION 6 tablet 5   testosterone (ANDROGEL) 50 MG/5GM (1%) GEL APPLY 1-1/2 PACKETS ONCE DAILY on shoulder area 300 g 2   No current facility-administered medications for this visit.     Objective:  BP 110/78   Pulse 69   Temp 98.3 F (36.8 C)   Ht 5' 8"  (1.727 m)   Wt 230 lb 9.6 oz (104.6 kg)   SpO2 97%   BMI 35.06 kg/m  Gen: NAD, resting comfortably CV: RRR no murmurs rubs or gallops Lungs: CTAB no crackles, wheeze, rhonchi Ext: trace edema Skin: warm, dry     Assessment and Plan   # Depression #memory loss vs focus issues S: Medication: sertraline 25 mg has been controlling symptoms -memory/focus concentration issues for a long term. Feels it has gotten worse in last few months though reports chronic baseline issues.  Can forget what he has been told by boss a few seconds later- counters that by taking a lot of notes. At home doesn't lose keys/phone/etc because uses consistent system.  - does have challenging stressful job  - almost like all day is like he just woke up in the morning -groggy/less focus feeling but has good days and bad days -has tried prevagen without relief. Later tried mind lab pro more recently  In 2019 ordered reversible cause of dementia workup including labs such as b12 and tsh (normal but  looking back b12 low normal)- had MR brain 03/30/2018 with Dr. Buddy Duty and with Dr. Zenia Resides 09/10/2018 without cause. Has taken b12 in the past - willing to retake  Also wonders about attention  Admits to some baseline anxiety -in past states did not feel like wellbutrin fit him well in 2013 Lab Results  Component Value Date   VITAMINB12 368 03/10/2017      08/14/2021    3:32 PM 04/26/2021    8:00 AM 04/20/2020    3:58 PM  Depression screen PHQ 2/9  Decreased Interest 0 0 0  Down, Depressed, Hopeless 0 0 0  PHQ - 2 Score 0 0 0  Altered sleeping 0 0 0  Tired, decreased energy 0 1 0  Change in appetite 0 0 0  Feeling bad or failure about yourself  0  0 0  Trouble concentrating 0 0 0  Moving slowly or fidgety/restless 0 0 0  Suicidal thoughts 0 0 0  PHQ-9 Score 0 1 0  Difficult doing work/chores Not difficult at all Not difficult at all Not difficult at all   A/P: Patient's initial comments were that he was worried sertraline could be affecting his memory-looking back he has had issues at least since 2011 noted with Dr. Arnoldo Morale a 02/2010-this was prior to sertraline being started originally.  Reversible causes of dementia work-up has been reassuring including MRI of the brain as well as other labs (we did see B12 under 400 and recommended taking 1000 mcg/day just to be on the safe side and target over 400) - This is clearly very distressing for patient and offered neuropsychological evaluation to further delve into memory concerns-he is open to this - Also discussed possible referral to [place] attention specialist for their opinion on possible adult ADD contributing depending on neuropsychological evaluation ADD   Recommended follow up: Return for next already scheduled visit or sooner if needed. Future Appointments  Date Time Provider [place]  05/30/2022  8:00 AM Marin Olp, MD [facility]    Lab/Order associations:   ICD-10-CM   1. Memory loss  R41.3 Ambulatory referral to Neurology    2. Concentration deficit  R41.840 Ambulatory referral to Neurology      Time Spent: 26 minutes of total time (11:59 AM- 12: 25 PM) was spent on the date of the encounter performing the following actions: chart review prior to seeing the patient and chart review during visit, obtaining history, performing a medically necessary exam, counseling on the work-up and treatment plan, placing orders, and documenting in our EHR.    Return precautions advised.  Garret Reddish, MD  Addendum on 11/01/2021-[facility] unable to do testing due to staffing and does not test memory under age 19-[facility] neurological does not provide  service-they informed us that Dr. Sima Matas at [facility] could potentially be evaluation-referral was placed -Garret Reddish

## 2021-10-26 NOTE — Patient Instructions (Addendum)
[facility] contact Please call to schedule visit and/or procedure Address: [address], [place], [place] [address] Phone: [phone]   Try b12 1000 mcg daily over the counter  We will call you within two weeks about your referral to neurology for neuropsychological testing. If you do not hear within 2 weeks, give [facility] a call directly Phone: [phone]  Recommended follow up: Return for next already scheduled visit or sooner if needed.

## 2021-11-01 ENCOUNTER — Encounter: Payer: Self-pay | Admitting: Psychology

## 2021-11-01 NOTE — Addendum Note (Signed)
Addended by: Marin Olp on: 11/01/2021 12:45 PM   Modules accepted: Orders

## 2021-11-02 ENCOUNTER — Other Ambulatory Visit: Payer: Self-pay | Admitting: Family Medicine

## 2021-12-04 ENCOUNTER — Other Ambulatory Visit: Payer: Self-pay | Admitting: Internal Medicine

## 2021-12-04 DIAGNOSIS — G4730 Sleep apnea, unspecified: Secondary | ICD-10-CM | POA: Diagnosis not present

## 2021-12-04 DIAGNOSIS — E042 Nontoxic multinodular goiter: Secondary | ICD-10-CM

## 2021-12-04 DIAGNOSIS — Z125 Encounter for screening for malignant neoplasm of prostate: Secondary | ICD-10-CM | POA: Diagnosis not present

## 2021-12-04 DIAGNOSIS — E230 Hypopituitarism: Secondary | ICD-10-CM | POA: Diagnosis not present

## 2021-12-04 DIAGNOSIS — Z86711 Personal history of pulmonary embolism: Secondary | ICD-10-CM | POA: Diagnosis not present

## 2021-12-06 ENCOUNTER — Ambulatory Visit
Admission: RE | Admit: 2021-12-06 | Discharge: 2021-12-06 | Disposition: A | Discharge status: Home / Self Care | Payer: BC Managed Care – PPO | Source: Ambulatory Visit | Attending: Internal Medicine | Admitting: Internal Medicine

## 2021-12-06 DIAGNOSIS — E042 Nontoxic multinodular goiter: Secondary | ICD-10-CM | POA: Diagnosis not present

## 2022-04-14 ENCOUNTER — Other Ambulatory Visit: Payer: Self-pay | Admitting: Family Medicine

## 2022-05-07 ENCOUNTER — Encounter: Payer: BC Managed Care – PPO | Attending: Psychology | Admitting: Psychology

## 2022-05-07 ENCOUNTER — Encounter: Payer: Self-pay | Admitting: Psychology

## 2022-05-07 DIAGNOSIS — H9313 Tinnitus, bilateral: Secondary | ICD-10-CM | POA: Diagnosis not present

## 2022-05-07 DIAGNOSIS — G4733 Obstructive sleep apnea (adult) (pediatric): Secondary | ICD-10-CM | POA: Insufficient documentation

## 2022-05-07 DIAGNOSIS — F411 Generalized anxiety disorder: Secondary | ICD-10-CM | POA: Diagnosis not present

## 2022-05-07 DIAGNOSIS — R41840 Attention and concentration deficit: Secondary | ICD-10-CM | POA: Insufficient documentation

## 2022-05-07 DIAGNOSIS — F3342 Major depressive disorder, recurrent, in full remission: Secondary | ICD-10-CM | POA: Diagnosis not present

## 2022-05-07 DIAGNOSIS — R413 Other amnesia: Secondary | ICD-10-CM | POA: Diagnosis not present

## 2022-05-07 NOTE — Progress Notes (Signed)
Neuropsychological Consultation   Patient:   [name]   DOB:   02-Dec-1965  MR Number:  [id number]  Location:  [facility] [place], [place] [id number] [place] [address] Dept: [phone]           Date of Service:   05/07/2022  Location of Service and Individuals present: Today's clinical interview was performed in my outpatient clinic office with the patient myself present.  1 hour and 15 minutes was spent in face-to-face clinical interview and the other 45 minutes was spent with records review, report writing and setting up testing protocols.  Start Time:   8 AM End Time:   10 AM  Patient Consent and Confidentiality: Patient was verbally informed of consent and confidentiality issues and limitations to confidentiality with availability of records in EMR to facilitate communication with his PCP/referral provider and availability for other health professionals.  Consent for Evaluation and Treatment:  Signed:  Yes Explanation of Privacy Policies:  Signed:  Yes Discussion of Confidentiality Limits:  Yes  Provider/Observer:  Ilean Skill, Psy.D.       Clinical Neuropsychologist       Billing Code/Service: [phone]  Chief Complaint:     Chief Complaint  Patient presents with   Memory Loss   Tinnitus   Other    Changes in attention and concentration    Reason for Service:    [name] is a 57 year old male referred for neuropsychological evaluation by the patient's primary care provider Garret Reddish, MD due to reports of increasing and ongoing memory difficulties (both short-term and long-term memory), difficulties with attention and concentration and staying on task and significant intrusive tinnitus.  The patient describes difficulties with memory and attention/concentration that are persistent for a long time.  The patient reports that he for started  noticing difficulties that were minor initially after returning from his [employer] where he served in [employer].  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  Patient denies any tremors or tic type symptoms.  Patient reports that these difficulties have become progressively worse and have particularly become worse in the past 2 to 5 years.  Blood work/laboratory studies have been within normal limits with the exception of reduced B12 which she is now supplementing with.  The patient is also taking niacin as well.  He felt that supplementing the B12 was somewhat helpful.  The patient has had a workup in 2019 for reversible type causes of dementia including B12 and B vitamin workup, MRI brain.  Patient describes difficulties with attention and baseline levels of anxiety and a history of depressive symptoms that have a waxing and waning nature to them.  Patient has been treated psychotropic [facility]ward for his depression/anxiety symptoms in the past and attempted Wellbutrin previously without significant improvement.  Patient currently takes sertraline and is taking it for some time and feels like it is somewhat helpful but is concerned that it may be covering up some symptoms rather than directly treating them.  Patient denies history of significant concussive events with loss of consciousness or other neurological symptoms outside of attention, memory and executive functioning issues.  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 ? 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  Patient reports that he did not see active combat directly but was in theater with constant threat of Missile attack and missiles being intercepted by Patriot battery is near him.  Progression of Symptoms: Patient reports that he feels like the symptoms have been progressing and has shown particular progression over the past 2 to 5 years.  Triggering Factors: Symptoms first noted shortly after returning from [facility].  Associated Symptoms (e.g., cognitive, emotional, behavioral): Patient describes difficulties with learning new information and attending to information provided at work, difficulties maintaining attention and concentration and worsening of symptoms similar to anxiety and depressive type symptoms.  Additional Tests and Measures from other records:  Neuroimaging Results: Patient had an CT head and MRI brain conducted in July 2020 and a MRI also conducted earlier in January 2020.  The studies were all normal with no indications of acute process and normal cortical, subcortical vascular findings.  They were initially initiated because of double vision that came on acutely at that time and concern for his cognitive status.  Laboratory Tests: Patient has essentially had normal blood work over the recent past with the exception of low end of normal regarding B12 and has been supplementing B nutrients and B vitamins since.  Current Typical Mood State:  Depressed and Anxious  Sleep: Patient has been diagnosed previously with formal sleep study of struct of sleep apnea but faithfully and compliantly uses his CPAP machine every night.  The patient reports that he feels like he sleeps fairly well but wakes up multiple times every night but is able to go back to sleep.  Diet Pattern: Patient describes a good appetite and describes a relatively good diet although increases in highly nutritious foods such as vegetables and whole grains/nuts/seeds etc. would also  be warranted.  Behavioral Observation/Mental Status:   [name]  presents as a 57 y.o.-year-old Right handed Caucasian Male who appeared his stated age. his dress was Appropriate and he was Well Groomed and his manners were Appropriate to the situation.  his participation was indicative of Appropriate behaviors.  There were not physical disabilities noted.  he displayed an appropriate level of cooperation and motivation.    Interactions:     Active Appropriate  Attention:   abnormal and attention span appeared shorter than expected for age  Memory:   abnormal; remote memory intact, recent memory impaired  Visuo-spatial:   not examined  Speech (Volume):  normal  Speech:   normal; normal  Thought Process:  Coherent and Relevant  Coherent, Directed, and Organized  Though Content:  WNL; not suicidal and not homicidal  Orientation:   person, place, time/date, and situation  Judgment:   Good  Planning:   Fair  Affect:    Anxious  Mood:    Dysphoric  Insight:   Good  Intelligence:   normal  Marital Status/Living:  Patient was born in [place] along with 2 siblings.  Patient is married and has been married for the past year and a half and was married previously for 9 years in duration.  Patient has one 73 year old son who has Hashimoto's disease.  He continues to live with his current wife.  Educational and Occupational History:     Highest Level of Education:   Patient completed high school, attended [employer] college as well as extensive training and education in the [employer].  Educational and Career Achievements: Patient also had extensive further education as part of his [place] service in the [employer] and also attended [employer].  Current Occupation:    Patient currently works in Production assistant, radio as a Librarian, academic.  Work History:   The patient served in the [employer] between August 1987 and August 1993.  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  The patient was honorably discharged.  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  Hobbies and Interests: Motorcycles/hiking  Impact of Symptoms on Work or School:  Patient is having significant difficulty due to his attention and memory issues at work and having difficulty keeping up  with what is being told for him to do at work and keeping up with work duties.  Impact of Symptoms on Social Functioning and Interpersonal Relationships: Patient does not describe significant changes in social functioning and interpersonal functioning.   Psychiatric History:    Abuse/Trauma History: The patient did serve in [place] during active combat/war declaration status.  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  Previous Diagnoses: No prior psychiatric diagnoses beyond episodes of depression and anxiety.  History of Substance Use or Abuse:  No concerns of substance abuse are reported.  Patient denies any substance use.  Mental Health Hospitalizations:  No   Family Med/Psych History:  Family History  Problem Relation Age of Onset   Arthritis Mother    Irritable bowel syndrome Mother    COPD Father        lost dad in 2019  Healthy Brother    Healthy Brother    Lung cancer Paternal Grandmother    Colon cancer Neg Hx    Esophageal cancer Neg Hx    Rectal cancer Neg Hx     Risk of Suicide/Violence: virtually non-existent patient denies any suicidal or homicidal ideation.  Impression/DX:   JARRAD KILCULLEN is a 57 year old male referred for neuropsychological evaluation by the patient's primary care provider Garret Reddish, MD due to reports of increasing and ongoing memory difficulties (both short-term and long-term memory), difficulties with attention and concentration and staying on task and significant intrusive tinnitus.  The patient describes difficulties with memory and attention/concentration that are persistent for a long time.  The patient reports that he for started noticing difficulties that were minor initially after returning from his [employer]  where he served in [employer].  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  Patient denies any tremors or tic type symptoms.  Disposition/Plan:  We have set the patient up for formal neuropsychological testing.  Because of questions around attentional issues as well as memory and other cognitive issues the patient will be administered the comprehensive attention battery and the CAB CPT measures as well as the Wechsler Adult Intelligence Scale's and Wechsler Memory Scale's.  We will stick to traditional measures as they will be more applicable and interpreted by veterans administration which the patient continues to be eligible to receive.  Diagnosis:    Memory difficulties  Attention and concentration deficit  Obstructive sleep apnea        Note: This document was prepared using Dragon voice recognition software and may include unintentional dictation errors.   Electronically Signed   _______________________ Ilean Skill, Psy.D. Clinical Neuropsychologist

## 2022-05-28 DIAGNOSIS — R413 Other amnesia: Secondary | ICD-10-CM | POA: Diagnosis not present

## 2022-05-28 DIAGNOSIS — F3342 Major depressive disorder, recurrent, in full remission: Secondary | ICD-10-CM | POA: Diagnosis not present

## 2022-05-28 DIAGNOSIS — R41840 Attention and concentration deficit: Secondary | ICD-10-CM

## 2022-05-28 DIAGNOSIS — F411 Generalized anxiety disorder: Secondary | ICD-10-CM | POA: Diagnosis not present

## 2022-05-28 DIAGNOSIS — H9313 Tinnitus, bilateral: Secondary | ICD-10-CM | POA: Diagnosis not present

## 2022-05-28 DIAGNOSIS — G4733 Obstructive sleep apnea (adult) (pediatric): Secondary | ICD-10-CM

## 2022-05-28 NOTE — Progress Notes (Signed)
Behavioral Observations: The patient appeared well-groomed and appropriately dressed. His manners were polite and appropriate to the situation. The patient had moderate difficulty understanding instructions on the Spatial addition task and became frustrated at times throughout the memory test. His overall attitude towards testing was positive and his effort was good.   Neuropsychology Note  MELBURN LEIBFRIED completed 140 minutes of neuropsychological testing with technician, Wandra Scot, BA, under the supervision of Ilean Skill, PsyD., Clinical Neuropsychologist. The patient did not appear overtly distressed by the testing session, per behavioral observation or via self-report to the technician. Rest breaks were offered.   Clinical Decision Making: In considering the patient's current level of functioning, level of presumed impairment, nature of symptoms, emotional and behavioral responses during clinical interview, level of literacy, and observed level of motivation/effort, a battery of tests was selected by Dr. Sima Matas during initial consultation on 05/07/2022. This was communicated to the technician. Communication between the neuropsychologist and technician was ongoing throughout the testing session and changes were made as deemed necessary based on patient performance on testing, technician observations and additional pertinent factors such as those listed above.  Tests Administered: Wechsler Adult Intelligence Scale, 4th Edition (WAIS-IV) Wechsler Memory Scale, 4th Edition (WMS-IV); Adult Battery  Results: WAIS-IV:   Composite Score Summary  Scale Sum of Scaled Scores Composite Score Percentile Rank 95% Conf. Interval Qualitative Description  Verbal Comprehension 36 VCI 110 75 104-115 High Average  Perceptual Reasoning 41 PRI 121 92 114-126 Superior  Working Memory 25 WMI 114 82 106-120 High Average  Processing Speed 23 PSI 108 70 99-116 Average  Full Scale 125 FSIQ 117  87 113-121 High Average  General Ability 77 GAI 118 88 113-122 High Average   Verbal Comprehension Subtests Summary  Subtest Raw Score Scaled Score Percentile Rank Reference Group Scaled Score SEM  Similarities 30 12 75 13 1.04  Vocabulary 45 12 75 13 0.73  Information 18 12 75 13 0.73   Perceptual Reasoning Subtests Summary  Subtest Raw Score Scaled Score Percentile Rank Reference Group Scaled Score SEM  Block Design 57 14 91 13 0.95  Matrix Reasoning 23 15 95 14 0.95  Visual Puzzles 16 12 75 10 0.85   Working Doctor, general practice Raw Score Scaled Score Percentile Rank Reference Group Scaled Score SEM  Digit Span 30 11 63 11 0.73  Arithmetic 19 14 91 14 0.90   Processing Speed Subtests Summary  Subtest Raw Score Scaled Score Percentile Rank Reference Group Scaled Score SEM  Symbol Search 36 12 75 11 1.56  Coding 72 11 63 10 1.20   WMS-IV:  Index Score Summary  Index Sum of Scaled Scores Index Score Percentile Rank 95% Confidence Interval Qualitative Descriptor  Auditory Memory (AMI) 41 101 53 95-107 Average  Visual Memory (VMI) 34 90 25 85-96 Average  Visual Working Memory (VWMI) 12 77 6 71-86 Borderline  Immediate Memory (IMI) 37 94 34 88-101 Average  Delayed Memory (DMI) 38 96 39 89-103 Average    Primary Subtest Scaled Score Summary  Subtest Domain Raw Score Scaled Score Percentile Rank  Logical Memory I AM 23 9 37  Logical Memory II AM 13 7 16   Verbal Paired Associates I AM 35 11 63  Verbal Paired Associates II AM 13 14 91  Designs I VM 55 7 16  Designs II VM 49 9 37  Visual Reproduction I VM 35 10 50  Visual Reproduction II VM 17 8 25   Spatial Addition VWM 1  2 0.4  Symbol Span VWM 22 10 50   Auditory Memory Process Score Summary  Process Score Raw Score Scaled Score Percentile Rank Cumulative Percentage (Base Rate)  LM II Recognition 25 - - 51-75%  VPA II Recognition 40 - - >75%    Visual Memory Process Score Summary  Process Score Raw Score  Scaled Score Percentile Rank Cumulative Percentage (Base Rate)  DE I Content 40 13 84 -  DE I Spatial 15 9 37 -  DE II Content 42 15 95 -  DE II Spatial 7 6 9  -  DE II Recognition 17 - - >75%  VR II Recognition 6 - - 51-75%  VR II Copy 43 - - >75%   ABILITY-MEMORY ANALYSIS  Ability Score:  VCI: 110 Date of Testing:  WAIS-IV; WMS-IV 2022/05/28  Predicted Difference Method   Index Predicted WMS-IV Index Score Actual WMS-IV Index Score Difference Critical Value  Significant Difference Y/N Base Rate  Auditory Memory 105 101 4 9.43 N   Visual Memory 105 90 15 9.19 Y 10-15%  Visual Working Memory 106 77 29 11.15 Y 1-2%  Immediate Memory 106 94 12 10.27 Y 15-20%  Delayed Memory 105 96 9 10.03 N   Statistical significance (critical value) at the .01 level.    Feedback to Patient: TRAVONE STROLE will return on 01/29/2023 for an interactive feedback session with Dr. Sima Matas at which time his test performances, clinical impressions and treatment recommendations will be reviewed in detail. The patient understands he can contact our office should he require our assistance before this time.  140 minutes spent face-to-face with patient administering standardized tests, 30 minutes spent scoring Environmental education officer). [CPT [id number], [id number]  Full report to follow.

## 2022-05-30 ENCOUNTER — Encounter: Payer: BC Managed Care – PPO | Admitting: Family Medicine

## 2022-06-04 ENCOUNTER — Encounter: Payer: BC Managed Care – PPO | Attending: Psychology

## 2022-06-04 DIAGNOSIS — R41840 Attention and concentration deficit: Secondary | ICD-10-CM

## 2022-06-04 DIAGNOSIS — G4733 Obstructive sleep apnea (adult) (pediatric): Secondary | ICD-10-CM | POA: Diagnosis not present

## 2022-06-04 DIAGNOSIS — R413 Other amnesia: Secondary | ICD-10-CM | POA: Diagnosis not present

## 2022-06-04 DIAGNOSIS — F411 Generalized anxiety disorder: Secondary | ICD-10-CM | POA: Diagnosis not present

## 2022-06-04 DIAGNOSIS — H9313 Tinnitus, bilateral: Secondary | ICD-10-CM | POA: Insufficient documentation

## 2022-06-04 DIAGNOSIS — F331 Major depressive disorder, recurrent, moderate: Secondary | ICD-10-CM | POA: Insufficient documentation

## 2022-06-04 NOTE — Progress Notes (Signed)
   Behavioral Observations: The patient appeared well-groomed and appropriately dressed. His manners were polite and appropriate to the situation. His attitude towards testing was positive and his effort was good.   Neuropsychology Note  [name] completed 75 minutes of neuropsychological testing with technician, Wandra Scot, BA, under the supervision of Ilean Skill, PsyD., Clinical Neuropsychologist. The patient did not appear overtly distressed by the testing session, per behavioral observation or via self-report to the technician. Rest breaks were offered.   Clinical Decision Making: In considering the patient's current level of functioning, level of presumed impairment, nature of symptoms, emotional and behavioral responses during clinical interview, level of literacy, and observed level of motivation/effort, a battery of tests was selected by Dr. Sima Matas during initial consultation on 05/07/2022. This was communicated to the technician. Communication between the neuropsychologist and technician was ongoing throughout the testing session and changes were made as deemed necessary based on patient performance on testing, technician observations and additional pertinent factors such as those listed above.  Tests Administered: Comprehensive Attention Battery (CAB) Continuous Performance Test (CPT)  Results: Will be included in final report   Feedback to Patient: [name] will return on 07/02/2022 for an interactive feedback session with Dr. Sima Matas at which time his test performances, clinical impressions and treatment recommendations will be reviewed in detail. The patient understands he can contact our office should he require our assistance before this time.  75 minutes spent face-to-face with patient administering standardized tests, 30 minutes spent scoring Environmental education officer). [CPT [id number], [id number]  Full report to follow.

## 2022-06-06 ENCOUNTER — Encounter: Payer: Self-pay | Admitting: Psychology

## 2022-06-06 ENCOUNTER — Encounter (HOSPITAL_BASED_OUTPATIENT_CLINIC_OR_DEPARTMENT_OTHER): Payer: BC Managed Care – PPO | Admitting: Psychology

## 2022-06-06 DIAGNOSIS — R413 Other amnesia: Secondary | ICD-10-CM

## 2022-06-06 DIAGNOSIS — H9313 Tinnitus, bilateral: Secondary | ICD-10-CM | POA: Diagnosis not present

## 2022-06-06 DIAGNOSIS — R41840 Attention and concentration deficit: Secondary | ICD-10-CM | POA: Diagnosis not present

## 2022-06-06 DIAGNOSIS — G4733 Obstructive sleep apnea (adult) (pediatric): Secondary | ICD-10-CM | POA: Diagnosis not present

## 2022-06-06 DIAGNOSIS — F331 Major depressive disorder, recurrent, moderate: Secondary | ICD-10-CM | POA: Diagnosis not present

## 2022-06-06 DIAGNOSIS — F411 Generalized anxiety disorder: Secondary | ICD-10-CM | POA: Diagnosis not present

## 2022-06-06 NOTE — Progress Notes (Signed)
Neuropsychological Evaluation   Patient:  [name]   DOB: [birth year]  MR Number: [id number]  Location: [facility] [place], [place] [id number] [place] [address] Dept: [phone]  Start: 9 AM End: 10 AM  Provider/Observer:     Edgardo Roys PsyD  Chief Complaint:      Chief Complaint  Patient presents with   Memory Loss   Tinnitus   Other    Attention and concentration difficulties    Reason For Service:      [name] is a 57 year old male referred for neuropsychological evaluation by the patient's primary care provider Ian Reddish, MD due to reports of increasing and ongoing memory difficulties (both short-term and long-term memory), difficulties with attention and concentration and staying on task and significant intrusive tinnitus.  The patient describes difficulties with memory and attention/concentration that are persistent for a long time.  The patient reports that he first started noticing difficulties, that were minor initially, after returning from his [employer] where he served in [employer].  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  Patient denies any tremors or tic type symptoms.   Patient reports that these difficulties have become progressively worse and have particularly become worse in the past 2 to 5 years.  Blood work/laboratory studies have been within normal limits with the exception of reduced B12, which he is now supplementing with.  The patient is also taking niacin as well.  He felt that supplementing the B12 was somewhat helpful.  The patient has had a workup in 2019 for reversible type  causes of cognitive issues including B12 and B vitamin workup, MRI brain.  Patient describes difficulties with attention and baseline levels of anxiety and a history of depressive symptoms that have a waxing and waning nature to them.  Patient has been treated with psychotropics for his depression/anxiety symptoms in the past and attempted Wellbutrin previously without significant improvement.  Patient currently takes sertraline and has taken it for some time and feels like it is somewhat helpful but is concerned that it may be covering up some symptoms rather than directly treating them.   Patient denies history of significant concussive events with loss of consciousness or other neurological symptoms outside of attention, memory and executive functioning issues.   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 ? 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  Patient reports that he did not see active combat directly but was in theater with constant threat of Missile attack and missiles being intercepted by [employer] battery near him.   Progression of Symptoms: Patient reports that he feels like the symptoms have been progressing and has shown particular progression over the past 2 to 5 years.   Triggering Factors: Symptoms first noted shortly after returning from [place].   Associated Symptoms (e.g., cognitive, emotional, behavioral): Patient describes difficulties with learning new information and attending to information provided at work, difficulties maintaining attention and concentration and worsening of symptoms similar to anxiety and depressive type symptoms.   Additional Tests and Measures from other records:   Neuroimaging Results: Patient had an CT head and MRI brain conducted in July 2020 and a MRI also conducted earlier in January 2020.  The studies were all normal with no indications of acute process and normal cortical, subcortical vascular findings.  They were initially initiated because of double vision that came on acutely at that time and concern for his cognitive status.   Laboratory Tests: Patient has essentially had normal  blood work over the recent past with the exception of low end of normal regarding B12 and has been supplementing B nutrients and B vitamins since.   Current Typical Mood State:  Depressed and Anxious   Sleep: Patient has been diagnosed previously with formal sleep study for obstructive sleep apnea but faithfully and compliantly uses his CPAP machine every night.  The patient reports that he feels like he sleeps fairly well but wakes up multiple times every night but is able to go back to sleep.   Diet Pattern: Patient describes a good appetite and describes a relatively good diet, although increases in highly nutritious foods such as vegetables and whole grains/nuts/seeds etc. would also be warranted.  Tests Administered: Wechsler Adult Intelligence Scale, 4th Edition (WAIS-IV) Wechsler Memory Scale, 4th Edition (WMS-IV); Adult Battery Comprehensive Attention Battery (CAB) Continuous Performance Test (CPT)  Participation Level:   Active  Participation Quality:  Appropriate      Behavioral Observation:  The patient appeared well-groomed and appropriately dressed. His manners were polite and appropriate to the situation. The patient had  moderate difficulty understanding instructions on the Spatial addition task and became frustrated at times throughout the memory test. His overall attitude towards testing was positive and his effort was good.    Well Groomed, Alert, and Appropriate.   Test Results:   Initially, an estimation was made as to the patient's premorbid intellectual and cognitive functioning using various psychosocial and educational factors.  The patient completed high school and attended [employer] and had extensive training and education in the [employer].  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  It is estimated conservatively that the patient likely has performed in the high  average range or higher and global intellectual capacity and premorbid cognitive functioning with some areas likely to open even higher given his career type achievements.  We will utilize an estimation of around 1 standard deviation above normative population for comparison purposes.  Estimation was also made as to the patient's effort level and validity of current assessment.  The patient appeared to try his hardest throughout the assessment although there were some individual subtest that created some frustration during memory testing.  The patient produced measures within normative expectations on pure reaction times and embedded validity scales and measures were also within normal limits.  This does appear to be a fair and valid assessment of his current cognitive functioning.   The patient was administered the comprehensive attention battery and the CAB CPT measures to provide a thorough assessment of multiple domains of attention and concentration and executive functioning in both visual and auditory domains.  Measure administered was the auditory/visual pure reaction time measures.  These are "Go test" where the patient is instructed to respond to a consistent stimuli that is either visual or auditory in nature.  Little to no decision is made on these measures but given estimate of global arousal levels and primary information processing speed.  On the pure visual reaction time measure the patient correctly responded to 50 of 50 targets with no errors of omission and no multiple responses.  His average response time was 366 ms which is all within normal limits.  On the pure auditory reaction time the patient correctly responded to 48 of 50 targets with 2 errors of omission.  Average response time was 307 ms.  Accuracy and response x 4 within normative expectations.  Patient was then administered the discriminate reaction time measures.  These measures are viewed as a "Go No go test" in which the  patient has to make a sudden/rapid decision about whether to respond or not respond to a particular target stimuli with nontarget stimuli presented as well.  On the visual discriminate reaction time test the patient correctly identified 35 of 35 targets with no errors of omission and no errors of cognition.  This is within normative expectations.  The patient produced an average response time of 388 ms which is equal to or slightly better than normative predictions.  On the auditory discriminate reaction time measure the patient correctly identified 35 of 35 targets with no errors of omission and no errors of commission.  Average response time was 517 ms.  All of these are within normative expectations.  On the mixed/shift discriminate reaction time measure which requires the patient to shift between changing target stimuli the patient had a little bit more difficulty.  He correctly identified 29 of 30 targets but had 15 errors of commission.  This suggest that the patient had difficulty rapidly shifting between target stimuli and often made errors responding to an auditory target with a similar visual nontarget response.  Patient is average response time was 559 ms which is within normative expectations.  The patient was then administered the auditory/visual scan reaction time measure.  For the visual, auditory and mixed trials the patient showed excellent performance for accuracy scores correctly responding to 40 of 40 targets on each of the 3 individual subtest.  There were no errors of omission and no errors of commission identified.  Average response time was 599 ms for the visual scan reaction time measure, 1004 ms for the auditory scan reaction time measure and 851 ms for the mixed scan reaction time measure.  All accuracy scores and response times were within normative expectations for age and gender comparison groups.  The patient was then administered the auditory/visual encoding test.  On the auditory  encoding test, the patient performed very well with performances between 1-1/2 and nearly 2 standard deviations better than normative expectations.  He did very well on both auditory forwards and backwards encoding measures.  The patient did well on the visual encoding measures as well performing within normative expectations to nearly 1 standard deviation above normative expectations for the visual encoding backwards measures.  These patterns clearly show that the patient is doing well for both auditory and visual encoding capacity.  The patient was then administered the Stroop interference cancellation test.  Initially, this measure is a simple cancellation task that assesses focus execute ability with the patient needing to rapidly identify target items where the word and color match on a grid of 36 stimuli 18 of which are colored word matches and 18 of which are mismatches.  On the 9 interference trials the patient performed at the lower end of the average range for identification of target stimuli.  He correctly responded to between 7 and 9 target stimuli in the allotted 15 seconds for each trial.  In the second half of this measure the patient, who was instructed ahead of time that this measure will automatically transition into a targeted distractor challenge and the patient was to continue with his same performance even though a targeted auditory distractor is presented, the patient did very well and remained free from external distractor.  His performance actually was greater in the targeted distractor components/trials and he was in the 9 interference trials.  The patient correctly identified between 9 and 17 of the targets with performance consistently improving across time.  The patient showed no initial difficulty adjusting to the the targeted interference trials and he had his highest performance on the last 3 interference trials getting between 16 and 17 of the 18 targets identified.  The patient  had no errors of commission on either the 9 interference or interference trials.  Finally, the patient completed the visual monitor CPT measure of the comprehensive attention battery.  This is a visual discriminate reaction time measure that is 15 minutes in duration and requires the patient to make a rapid decision between responding or nonresponding to either target or not on target stimuli and the patient pain.  His 15-minute task is broken down into five 3-minute blocks of time for analysis.  On the first 3 minutes of this measure the patient correctly identified 30 of 30 targets with no errors of commission and no errors of omission.  This is excellent accuracy performance was continued throughout the entire 15 minutes of this  measure and in total he had only 1 error of omission and no errors of commission during the entire 15 minutes of the task.  On the first 3 minutes of this continuous performance measure the patient average 337 ms for his response time and this response time performance remained exceptionally stable over the entire 15 minutes of the test and in fact by the last 3 minutes 12-15-minute mark his average response time was 359 ms.  There was very little variability between any of the 3-minute blocks of time and the patient's performance did not show any clinically significant slowing as a function of time across the entire 15-minute task.  Overall, the patient did very well on measures of sustaining attention and concentration.  Across the board, the patient showed very strong attention and concentration capacity.  With the exception of some difficulty shifting attention on 1 individual measure all other measures of his battery were generally equal to or better than normative expectations.  The patient did have some individual items where it took him a brief period of time to acclimate himself but once he had adjusted his performance was quite good.  There were no indications of patterns of  attentional deficits, impulse control issues, distractibility or deficits with sustaining attention.  The patient did well on both auditory and visual encoding measures, focus execute abilities, freedom from distractibility, sustaining attention and these performances were true for both auditory and visual attentional domains.  WAIS-IV:              Composite Score Summary          Scale Sum of Scaled Scores Composite Score Percentile Rank 95% Conf. Interval Qualitative Description  Verbal Comprehension 36 VCI 110 75 104-115 High Average  Perceptual Reasoning 41 PRI 121 92 114-126 Superior  Working Memory 25 WMI 114 82 106-120 High Average  Processing Speed 23 PSI 108 70 99-116 Average  Full Scale 125 FSIQ 117 87 113-121 High Average  General Ability 77 GAI 118 88 113-122 High Average    The patient was administered the Wechsler Adult Intelligence Scale-IV to provide a thorough assessment of a wide range of cognitive domains and a highly standardized well normed battery of tests.  This assessment produces a couple of global/composite scores for analysis.  As the patient is describing changes in cognitive function and memory the score should not be taken specifically as descriptive of his lifelong premorbid functioning but reflexive of his current status.  The patient produced a full-scale IQ score of 117 which falls at the [place] percentile and is in the high average range.  This is generally consistent with predicted levels of premorbid functioning and do not suggest widespread cognitive deficits in multiple domains.  We also calculated the patient's general abilities index which places less emphasis on auditory encoding/attention and information processing speed as these variables are often the most sensitive to acute change.          Verbal Comprehension Subtests Summary        Subtest Raw Score Scaled Score Percentile Rank Reference Group Scaled Score SEM  Similarities 30 12 75 13 1.04   Vocabulary 45 12 75 13 0.73  Information 18 12 75 13 0.73    The patient produced a verbal comprehension index score of 110 which falls at the 75th percentile and is in the high average range relative to normative population.  This is only slightly below predicted levels and does not indicate any significant change from premorbid  functioning.  The patient showed consistent scores in subtest performance and performance consistent with premorbid estimations on measures including verbal reasoning and problem-solving, vocabulary knowledge and his general fund of information.          Perceptual Reasoning Subtests Summary        Subtest Raw Score Scaled Score Percentile Rank Reference Group Scaled Score SEM  Block Design 57 14 91 13 0.95  Matrix Reasoning 23 15 95 14 0.95  Visual Puzzles 16 12 75 10 0.85    The patient produced a perceptual reasoning index score of 121 which falls at the 92nd percentile and is in the superior range.  This performance is quite deficient and not indicative of significant deficits in this area and consistent with vocational and occupational history and likely always been a strength for the patient.  The patient did well on his capacity to analyze geometric patterns and measures of visual analysis and organization.  Patient also did well on measures of nonverbal reasoning and perceptual organizational skills as well as attention to detail and fluid reasoning skills.          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  The patient showed performances consistent with those seen on similar measures from the comprehensive attention battery and he did particularly well on measures of  processing information in his active auditory Register.  There were no indications of auditory encoding deficits or capacity to process information in his auditory Register.          Processing Speed Subtests Summary        Subtest Raw Score Scaled Score Percentile Rank Reference Group Scaled Score SEM  Symbol Search 36 12 75 11 1.56  Coding 72 11 63 10 1.20    The patient produced a processing speed index score of 108 which falls at the [facility] and is in the average range relative to a normative population.  Consistency between subtest measures were noted performing between the 63rd and 75th percentile.  The patient did well on measures of visual scanning, visual searching and overall speed of mental operations.  Combining these performances with those of similar type tasks on the comprehensive attention battery would suggest no deficits with regard to focus execute abilities and information processing speed relative to both normative populations but also against predicted levels of premorbid functioning.  WMS-IV:          Index Score Summary        Index Sum of Scaled Scores Index Score Percentile Rank 95% Confidence Interval Qualitative Descriptor  Auditory Memory (AMI) 41 101 53 95-107 Average  Visual Memory (VMI) 34 90 25 85-96 Average  Visual Working Memory (VWMI) 12 77 6 71-86 Borderline  Immediate Memory (IMI) 37 94 34 88-101 Average  Delayed Memory (DMI) 38 96 39 89-103 Average    The patient was then administered the Wechsler Memory Scale-IV to provide an objective thorough assessment of a wide range of memory and learning domains.  On the Wechsler Adult Intelligence Scale and the comprehensive attention battery the patient did well on measures of pure auditory encoding capacity to process information in  his immediate auditory Register.  On the patient's visual working memory from the [employer] the patient showed much more difficulty producing a visual working  memory index score of 77 which fell at the 6 percentile in the borderline range relative to a normative population.  This performances in contrast to good performance on visual encoding from the comprehensive attention battery.  Looking at individual subtest that make up this composite the patient showed significant difficulty on the spatial addition task where he produced a scaled score of 2 on that 1 individual subtest.  The patient performed in the average range on symbol span subtest.  Noted in behavioral observations from psychometrician where that the patient had particular difficulty understanding the instructions on the subtest and became frustrated in general on memory task but particularly so on this 1 individual test.  Given the other 3 or 4 measures assessing the same domain all being fine I suspect that this 1 individual subtest skewed the results of the composite score.  There does not appear to be consistent evidence of significant visual processing.  Breaking memory and learning down between auditory learning versus visual learning the patient produced an auditory memory index score of 101 which falls at the 53rd percentile and is in the average range.  This is a little bit below predicted levels of premorbid functioning but they were still in the average range relative to a normative population.  The patient did more poorly on visual memory tasks but given behavioral observations he had experienced increased frustration on these measures and I suspect that some of the reduction in visual memory efficiency was related to frustrations with these particular measures rather than a clear difference between auditory versus visual memory.  His less and efficient performance on visual memory task also brought down his immediate memory index and delayed memory index score.  However, they were both in the average range relative to a normative population but roughly 1 standard deviation or so below  predictions of premorbid functioning levels.  However, looking deeper into individual subtest performance etc. it does appear that if the information is initially stored it is available for recall after period of delay.  The patient also did very well on measures of recognition/cued recall of visual and auditory information and the significant improvement in visual recall would suggest that the patient is actually learning and remembering more efficiently than he was able to provide under free recall type formats.  There does not appear to be a clear indication of significant memory deficits although they may be somewhat below predicted levels of premorbid functioning.          Primary Subtest Scaled Score Summary       Subtest Domain Raw Score Scaled Score Percentile Rank  Logical Memory I AM 23 9 37  Logical Memory II AM 13 7 16   Verbal Paired Associates I AM 35 11 63  Verbal Paired Associates II AM 13 14 91  Designs I VM 55 7 16  Designs II VM 49 9 37  Visual Reproduction I VM 35 10 50  Visual Reproduction II VM 17 8 25   Spatial Addition VWM 1 2 0.4  Symbol Span VWM 22 10 50          Auditory Memory Process Score Summary      Process Score Raw Score Scaled Score Percentile Rank Cumulative Percentage (Base Rate)  LM II Recognition 25 - - 51-75%  VPA II Recognition 40 - - >75%  Visual Memory Process Score Summary      Process Score Raw Score Scaled Score Percentile Rank Cumulative Percentage (Base Rate)  DE I Content 40 13 84 -  DE I Spatial 15 9 37 -  DE II Content 42 15 95 -  DE II Spatial 7 6 9  -  DE II Recognition 17 - - >75%  VR II Recognition 6 - - 51-75%  VR II Copy 43 - - >75%     Summary of Results:   Overall, the results of the current neuropsychological evaluation do suggest that globally, the patient is doing well and there does not appear to be any global or widespread cognitive change from premorbid functioning.  The patient did very well on measures of  visual-spatial and visual constructional capacity, visual reasoning and problem-solving, visual attention including encoding, shifting visual attention, sustaining visual attention and remaining free from visual distractors.  The patient showed good performance on focus execute capacity and information processing speed when visual scanning and visual searching and overall speed of mental operations are expected.  The patient also did well on measures of visual reasoning and problem-solving, receptive and expressive language capacity, retention of general fund of information, social judgment and comprehension capacity as well as auditory attention capacity.  The patient did well on auditory information processing speed, auditory attention including shifting attention, sustaining attention, remaining free from distractibility and divided auditory attention.  There was only 1 primary area of weaknesses relative to predicted levels of premorbid functioning and that had to do with visual learning and memory and 1 individual subtest within the memory test regarding visual encoding.  However, I suspect that frustration led to errors that then heightened his concern and had a specific functional deleterious effect on his visual learning.  His auditory learning and memory appear to be within normal limits.  The patient did well on measures related to retention of both auditory and visual information after initial learning trials.  What was initially encoded, stored and organized was available for later recall.  Also, under recognition/cued recall challenge the patient did very well and clearly cueing helped with performance.  This pattern would suggest that he is actually able to learn and retain new information generally consistent with his premorbid functioning levels but frustration, vigilance and concern over making errors have a particular deleterious impact on real world day-to-day  performances.  Impression/Diagnosis:   Overall, the results of the current neuropsychological evaluation are very encouraging.  There were no indications of objective cognitive deficits outside of a very few isolated subtest and components.  In fact, the primary areas that he had difficulty with were associated with increased frustration and likely hypervigilance over the possibility of making mistakes.  The patient tried very hard and when he was successfully doing a challenge maintaining very good motivation and effort.  The memory difficulties did not appear to be a primary inability to store, organize or retrieve new information but impacts upon cognitive efficiency when emotional distress and frustration are initiated having a particularly negative impact on the efficiency.  The patient did well under recognition/cued recall for memory and it does appear that he is adequately able to store, organize and retrieve new information.  The patient did very well on measures of attention and concentration both visually and auditorily, verbal reasoning and auditory reasoning and problem-solving/executive functioning, information processing speed and overall speed of mental operations, and capacity to sustain attention over time.  There were no indications of impulse control  issues or deficits with regard to executive functioning on objective measures.  The patient is not showing patterns of cognitive strengths and weaknesses that would be consistent with organic brain disorder such as solvent exposure or heavy metal/toxic exposures.  The patient does very well on measures of focus execute abilities and information processing speed etc.  The patient describes a progressive worsening of his symptoms over the past 2 to 5 years but at the same time he has also experienced ongoing symptoms of depression and anxiety and potential underlying PTSD type symptoms.  I suspect that these functional issues and organic changes  associated with PTSD are likely playing a causative role in his subjective reports and day-to-day experiences of reduced cognitive efficiency and memory difficulties.  The patient does have previous diagnosis of obstructive sleep apnea and reports that he is compliantly and consistently using his CPAP machine which I would strongly encourage has poor CPAP use/compliance would worsen the symptoms.  The patient is currently taking sertraline without apparent side effects and some improvements in overall symptoms, which would indicate that we are on the right track for managing the anxiety and depressive symptoms.  When I regarding the results I will go into greater depth regarding possible PTSD type symptoms to get a better handle on those possible variables.  I will also sit down with the patient and discuss other possible recommendations for him specifically but at this point I would continue to focus on treating the anxiety and depression as they are the most likely causative factors at play here.  The patient does describe significant difficulties with tinnitus as well and this is very likely heightening the anxiety and tension and we will address how the interplay between anxiety depression and tinnitus can have a functional impact on attention and memory directly.  Diagnosis:    Major depressive disorder, recurrent episode, moderate  Generalized anxiety disorder  Memory difficulties  Obstructive sleep apnea  Tinnitus of both ears  Rule out possibility of posttraumatic stress disorder.   _____________________ Ilean Skill, Psy.D. Clinical Neuropsychologist

## 2022-07-02 ENCOUNTER — Encounter (HOSPITAL_BASED_OUTPATIENT_CLINIC_OR_DEPARTMENT_OTHER): Payer: BC Managed Care – PPO | Admitting: Psychology

## 2022-07-02 DIAGNOSIS — F331 Major depressive disorder, recurrent, moderate: Secondary | ICD-10-CM | POA: Diagnosis not present

## 2022-07-02 DIAGNOSIS — H9313 Tinnitus, bilateral: Secondary | ICD-10-CM | POA: Diagnosis not present

## 2022-07-02 DIAGNOSIS — R41840 Attention and concentration deficit: Secondary | ICD-10-CM | POA: Diagnosis not present

## 2022-07-02 DIAGNOSIS — G4733 Obstructive sleep apnea (adult) (pediatric): Secondary | ICD-10-CM | POA: Diagnosis not present

## 2022-07-02 DIAGNOSIS — R413 Other amnesia: Secondary | ICD-10-CM | POA: Diagnosis not present

## 2022-07-02 DIAGNOSIS — F411 Generalized anxiety disorder: Secondary | ICD-10-CM | POA: Diagnosis not present

## 2022-07-04 ENCOUNTER — Encounter: Payer: Self-pay | Admitting: Family Medicine

## 2022-07-04 ENCOUNTER — Ambulatory Visit (INDEPENDENT_AMBULATORY_CARE_PROVIDER_SITE_OTHER): Payer: BC Managed Care – PPO | Admitting: Family Medicine

## 2022-07-04 VITALS — BP 138/82 | HR 71 | Temp 97.6°F | Ht 68.0 in | Wt 234.6 lb

## 2022-07-04 DIAGNOSIS — K5130 Ulcerative (chronic) rectosigmoiditis without complications: Secondary | ICD-10-CM

## 2022-07-04 DIAGNOSIS — Z0000 Encounter for general adult medical examination without abnormal findings: Secondary | ICD-10-CM

## 2022-07-04 DIAGNOSIS — E041 Nontoxic single thyroid nodule: Secondary | ICD-10-CM

## 2022-07-04 DIAGNOSIS — E785 Hyperlipidemia, unspecified: Secondary | ICD-10-CM

## 2022-07-04 DIAGNOSIS — Z125 Encounter for screening for malignant neoplasm of prostate: Secondary | ICD-10-CM

## 2022-07-04 DIAGNOSIS — E230 Hypopituitarism: Secondary | ICD-10-CM | POA: Diagnosis not present

## 2022-07-04 DIAGNOSIS — E669 Obesity, unspecified: Secondary | ICD-10-CM

## 2022-07-04 DIAGNOSIS — D6859 Other primary thrombophilia: Secondary | ICD-10-CM

## 2022-07-04 DIAGNOSIS — I2782 Chronic pulmonary embolism: Secondary | ICD-10-CM | POA: Diagnosis not present

## 2022-07-04 DIAGNOSIS — Z131 Encounter for screening for diabetes mellitus: Secondary | ICD-10-CM

## 2022-07-04 NOTE — Progress Notes (Addendum)
Phone: [phone]   Subjective:  Patient presents today for their annual physical. Chief complaint-noted.   See problem oriented charting- ROS- full  review of systems was completed and negative  Per full ROS sheet completed by patient- other than baseline memory concerns  The following were reviewed and entered/updated in epic: Past Medical History:  Diagnosis Date   Anal fissure    Anxiety    Depression    DVT (deep venous thrombosis) (HCC)    Hypertension, essential 12/05/2017   Hypogonadism male    Multinodular goiter    Pulmonary embolism (HCC)    Sleep apnea with use of continuous positive airway pressure (CPAP)    Tubular adenoma of colon    Ulcerative colitis, unspecified    Patient Active Problem List   Diagnosis Date Noted   Memory difficulties 03/10/2017    Priority: High   Hypercoagulable state (HCC) 04/04/2015    Priority: High   Thyroid nodule 02/01/2014    Priority: High   Chronic pulmonary embolism (HCC)- see discharge summary 01/2014. ? 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    Priority: Low    Class: Acute   NEOP, MALIGNANT, SKIN, TRUNK 10/06/2006    Priority: Low   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   Personal history of pulmonary embolism 08/03/2020   Depression, major, single episode, in partial remission (HCC) 01/15/2007   Past Surgical History:  Procedure Laterality Date   MANDIBULAR HARDWARE REMOVAL Bilateral 07/10/2012   Procedure: BILATERAL  MANDIBULAR HARDWARE REMOVAL;  Surgeon: Osborn Coho, MD;  [facility] OR;  Service: ENT;  Laterality: Bilateral;   ORIF MANDIBULAR FRACTURE Left 05/09/2012   Procedure: OPEN REDUCTION INTERNAL FIXATION (ORIF) RIGHT ANTERIOR MANDIBULAR FRACTURE; Open Reduction Internal Fixation of Midface fracture; Reduction of septal fracture; arch bars; Repair of lacerations;  Surgeon: Osborn Coho, MD;  Location[facility] OR;  Service: ENT;  Laterality: Left;   TRACHEOSTOMY TUBE PLACEMENT N/A 05/09/2012   Procedure: TRACHEOSTOMY;  Surgeon: Osborn Coho, MD;  Location[facility] OR;  Service: ENT;  Laterality: N/A;    Family History  Problem Relation Age of Onset   Arthritis Mother    Irritable bowel syndrome Mother    COPD Father        lost dad in 2019   Healthy Brother    Healthy Brother    Lung cancer Paternal Grandmother    Colon cancer Neg Hx    Esophageal cancer Neg Hx    Rectal cancer Neg Hx     Medications- reviewed and updated Current Outpatient Medications  Medication Sig Dispense Refill   sertraline (ZOLOFT) 25 MG tablet TAKE 1 TABLET BY MOUTH EVERY DAY 90 tablet 3   tadalafil (CIALIS) 20 MG tablet TAKE 1/2 TO 1 TABLET BY MOUTH EVERY OTHER DAY AS NEEDED FOR ERECTILE DYSFUNCTION 6 tablet 5   testosterone (ANDROGEL) 50 MG/5GM (1%) GEL APPLY 1-1/2 PACKETS ONCE DAILY on shoulder area 300 g 2   XARELTO 20  MG TABS tablet TAKE 1 TABLET BY MOUTH EVERY DAY 90 tablet 3   No current facility-administered medications for this visit.    Allergies-reviewed and updated Allergies  Allergen Reactions   Flagyl [Metronidazole] Other (See Comments)    REACTION: Fatigue    Social History   Social History Narrative   Married 2nd time in 2023. 1 son in [place].       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    Objective  Objective:  BP 138/82   Pulse 71   Temp 97.6 F (36.4 C)   Ht 5\' 8"  (1.727 m)   Wt 234 lb 9.6 oz  (106.4 kg)   SpO2 96%   BMI 35.67 kg/m  Gen: NAD, resting comfortably HEENT: Mucous membranes are moist. Oropharynx normal Neck: thyromegaly noted- stable CV: RRR no murmurs rubs or gallops Lungs: CTAB no crackles, wheeze, rhonchi Abdomen: soft/nontender/nondistended/normal bowel sounds. No rebound or guarding.  Ext: minimal edema Skin: warm, dry Neuro: grossly normal, moves all extremities, PERRLA   Assessment and Plan  57 y.o. male presenting for annual physical.  Health Maintenance counseling: 1. Anticipatory guidance: Patient counseled regarding regular dental exams -q6 months, eye exams - yearly,  avoiding smoking and second hand smoke , limiting alcohol to 2 beverages per day - basically 0 on average- very sparing social, no illicit drugs .   2. Risk factor reduction:  Advised patient of need for regular exercise and diet rich and fruits and vegetables to reduce risk of heart attack and stroke.  Exercise- spotty- feels could improve consistency- has walking track in his neighborhood-is going to try to get to at least 3 days a week but not beat himself up if he misses.  Diet/weight management-weight up 2 pounds from last physical- has tried to reduce portions and eat healthy foods.  Wt Readings from Last 3 Encounters:  07/04/22 234 lb 9.6 oz (106.4 kg)  10/26/21 230 lb 9.6 oz (104.6 kg)  08/14/21 227 lb 12.8 oz (103.3 kg)  3. Immunizations/screenings/ancillary studies-declines Shingrix for now, discussed Tetanus, Diphtheria, and Pertussis (Tdap)- need Tetanus, Diphtheria, and Pertussis (Tdap) if gets cut or scrape  Immunization History  Administered Date(s) Administered   DTP 10/06/2006   Influenza,inj,Quad PF,6+ Mos 02/02/2014, 12/18/2017   Influenza-Unspecified 02/02/2014, 11/03/2014, 12/02/2017, 12/18/2017   Td 03/05/1991, 10/06/2006   Tdap 03/05/1991, 10/06/2006, 05/09/2012  4. Prostate cancer screening- PSA increased some last year- had planned on follow up PSA within a few  weeks but unable to connect after last Annual Preventive Medicine and Wellness Visit- we opted to recheck today with labs  Lab Results  Component Value Date   PSA 2.47 04/26/2021   PSA 1.46 05/04/2020   PSA 1.62 06/28/2019   5. Colon cancer screening - overdue for colonoscopy-we referred him to [facility] last year per his request but he has not yet scheduled visit- gave # to call back today 6. Skin cancer screening-no dermatologist- Recommend calling local dermatologist for full body screening for skin cancer. advised regular sunscreen use. Denies worrisome, changing, or new skin lesions.  7. Smoking associated screening (lung cancer screening, AAA screen 65-75, UA)- never  smoker 8. STD screening - declines-no concern for infidelity  Status of chronic or acute concerns   % Ulcerative colitis-followed with Dr. Russella Dar in past - but wants to establish in [place] (where he lives) S: Last colonoscopy March 2021 with plan for 2-year repeat -Patient has opted to  come off Lialda/mesalamine-recommendations per GI were to continue- no blood in stool recently thankfully or abdominal pain  A/P: ulcerative colitis appears stable but needs updated colonoscopy- gave info for him to call in- remain off medications for now   #Hypercoagulable state/chronic PE S: Medication: Chronic Xarelto 20 mg.   History: Left leg DVT 02/01/2014 and also with pulmonary embolism and then later had superficial clot in the left GSV but with potential to extend to distal system noted 01/06/2015 so we have opted for lifelong therapy.  Symptoms:occasional shin discomfort- no shortness of breath reported  A/P: chronic pulmonary embolism- stable- continue current medicines    % Low testosterone-patient managed by Dr. Sharl Ma of [facility] endocrinology S:remains on testosterone gel   A/P: testosterone managed by Dr. Sharl Ma (he checks labs)- discussed if PSA went up would let Dr. Sharl Ma know   # Depression S: Medication: Zoloft 25Mg .      07/04/2022    9:05 AM 08/14/2021    3:32 PM 04/26/2021    8:00 AM  Depression screen PHQ 2/9  Decreased Interest 0 0 0  Down, Depressed, Hopeless 0 0 0  PHQ - 2 Score 0 0 0  Altered sleeping 0 0 0  Tired, decreased energy 0 0 1  Change in appetite 0 0 0  Feeling bad or failure about yourself  0 0 0  Trouble concentrating 0 0 0  Moving slowly or fidgety/restless 0 0 0  Suicidal thoughts 0 0 0  PHQ-9 Score 0 0 1  Difficult doing work/chores Not difficult at all Not difficult at all Not difficult at all  A/P: full remission- continue current medications    #hyperlipidemia S: Medication:None -He prefers to work on lifestyle.  Has declined coronary calcium scoring The 10-year ASCVD risk score (Arnett DK, et al., 2019) is: 9% Lab Results  Component Value Date   CHOL 199 04/26/2021   HDL 30.60 (L) 04/26/2021   LDLCALC 151 (H) 05/10/2013   LDLDIRECT 122.0 04/26/2021   TRIG 318.0 (H) 04/26/2021   CHOLHDL 7 04/26/2021   A/P: update lipids with labs - wants to work on lifestyle nad hold off on CT calcium yet again  #hypertension S: medication: none- after prior weight loss has been able to stay off medications- 130s/80's but can be high 80's at home  A/P: high acceptable range- he wants to work on healthy eating and regular exercise - discuseed dash eating plan   #Memory concern since at least 2011 - 06/06/22 normal neuropsych evaluation  - tried prevagen in 2022- 2023- didnt help much  Recommended follow up: Return in about 6 months (around 01/04/2023) for followup or sooner if needed.Schedule b4 you leave.  Lab/Order associations:NOT  fasting- will return     ICD-10-CM   1. Preventative health care  Z00.00 Comprehensive metabolic panel    CBC with Differential/Platelet    Lipid panel    TSH    PSA    Hemoglobin A1c    2. Other chronic pulmonary embolism, unspecified whether acute cor pulmonale present (HCC) Chronic I27.82     3. 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 Hypercoagulable state (HCC) Chronic D68.59     6. Thyroid nodule  E04.1     7. Hyperlipidemia, unspecified hyperlipidemia type  E78.5 Comprehensive metabolic panel    CBC with Differential/Platelet    Lipid panel    TSH    8. Screening for  prostate cancer  Z12.5 PSA    9. Screening for diabetes mellitus  Z13.1 Hemoglobin A1c    10. Obesity (BMI 30-39.9)  E66.9 Hemoglobin A1c      No orders of the defined types were placed in this encounter.   Return precautions advised.  Tana Conch, MD

## 2022-07-04 NOTE — Patient Instructions (Signed)
Your referral should still be on file as under a year- please call as soon as possible  [facility] [place] [address], [place] [address] [phone]  Recommend calling local dermatologist for full body screening for skin cancer  Goal average blood pressure <135/85- lets work on healthy eating and regular exercise to try to bring this down- see dash eating plan attachment  I suggest myfitnesspal Use 0.5 pounds per week weight loss goal Set a reasonable goal such as 5-10 lbs and can reset goal once you reach it Do not connect your step counter to this- watch or phone Update me in 2-3 months with how you are doing  Schedule a lab visit at the check out desk within 2 weeks. Return for future fasting labs meaning nothing but water after midnight please. Ok to take your medications with water.

## 2022-07-11 ENCOUNTER — Other Ambulatory Visit (INDEPENDENT_AMBULATORY_CARE_PROVIDER_SITE_OTHER): Payer: BC Managed Care – PPO

## 2022-07-11 DIAGNOSIS — Z131 Encounter for screening for diabetes mellitus: Secondary | ICD-10-CM | POA: Diagnosis not present

## 2022-07-11 DIAGNOSIS — E785 Hyperlipidemia, unspecified: Secondary | ICD-10-CM

## 2022-07-11 DIAGNOSIS — Z125 Encounter for screening for malignant neoplasm of prostate: Secondary | ICD-10-CM | POA: Diagnosis not present

## 2022-07-11 DIAGNOSIS — E669 Obesity, unspecified: Secondary | ICD-10-CM

## 2022-07-11 DIAGNOSIS — Z0000 Encounter for general adult medical examination without abnormal findings: Secondary | ICD-10-CM

## 2022-07-11 LAB — COMPREHENSIVE METABOLIC PANEL
ALT: 35 U/L (ref 0–53)
AST: 22 U/L (ref 0–37)
Albumin: 4.2 g/dL (ref 3.5–5.2)
Alkaline Phosphatase: 59 U/L (ref 39–117)
BUN: 12 mg/dL (ref 6–23)
CO2: 27 mEq/L (ref 19–32)
Calcium: 9.4 mg/dL (ref 8.4–10.5)
Chloride: 99 mEq/L (ref 96–112)
Creatinine, Ser: 1.01 mg/dL (ref 0.40–1.50)
GFR: 84.34 mL/min (ref >60.00)
Glucose, Bld: 93 mg/dL (ref 70–99)
Potassium: 4.2 mEq/L (ref 3.5–5.1)
Sodium: 136 mEq/L (ref 135–145)
Total Bilirubin: 0.9 mg/dL (ref 0.2–1.2)
Total Protein: 7.1 g/dL (ref 6.0–8.3)

## 2022-07-11 LAB — CBC WITH DIFFERENTIAL/PLATELET
Basophils Absolute: 0 10*3/uL (ref 0.0–0.1)
Basophils Relative: 0.4 % (ref 0.0–3.0)
Eosinophils Absolute: 0.1 10*3/uL (ref 0.0–0.7)
Eosinophils Relative: 1.8 % (ref 0.0–5.0)
HCT: 47.1 % (ref 39.0–52.0)
Hemoglobin: 16 g/dL (ref 13.0–17.0)
Lymphocytes Relative: 45.3 % (ref 12.0–46.0)
Lymphs Abs: 2.7 10*3/uL (ref 0.7–4.0)
MCHC: 34 g/dL (ref 30.0–36.0)
MCV: 88.8 fl (ref 78.0–100.0)
Monocytes Absolute: 0.8 10*3/uL (ref 0.1–1.0)
Monocytes Relative: 13 % — ABNORMAL HIGH (ref 3.0–12.0)
Neutro Abs: 2.4 10*3/uL (ref 1.4–7.7)
Neutrophils Relative %: 39.5 % — ABNORMAL LOW (ref 43.0–77.0)
Platelets: 218 10*3/uL (ref 150.0–400.0)
RBC: 5.31 Mil/uL (ref 4.22–5.81)
RDW: 14.4 % (ref 11.5–15.5)
WBC: 6 10*3/uL (ref 4.0–10.5)

## 2022-07-11 LAB — LDL CHOLESTEROL, DIRECT: Direct LDL: 117 mg/dL

## 2022-07-11 LAB — HEMOGLOBIN A1C: Hgb A1c MFr Bld: 5.9 % (ref 4.6–6.5)

## 2022-07-11 LAB — TSH: TSH: 1.56 u[IU]/mL (ref 0.35–5.50)

## 2022-07-11 LAB — PSA: PSA: 1.78 ng/mL (ref 0.10–4.00)

## 2022-07-11 LAB — LIPID PANEL
Cholesterol: 208 mg/dL — ABNORMAL HIGH (ref 0–200)
HDL: 30.9 mg/dL — ABNORMAL LOW (ref >39.00)
Total CHOL/HDL Ratio: 7
Triglycerides: 406 mg/dL — ABNORMAL HIGH (ref 0.0–149.0)

## 2022-07-31 ENCOUNTER — Encounter: Payer: Self-pay | Admitting: Psychology

## 2022-07-31 NOTE — Progress Notes (Signed)
Neuropsychological Evaluation   Patient:  [name]   DOB: 1965/06/26  MR Number: [id number]  Location: [facility] MEDICINE & REHABILITATION [address] [place], [address] [id number] [facility] [place] [address] Dept: [phone]  Start: 11 AM End: 12 PM  Provider/Observer:     Hershal Coria PsyD  Chief Complaint:      Chief Complaint  Patient presents with   Memory Loss   Tinnitus   Other   07/02/2022 11 AM-12 PM: Today, I provided feedback regarding the results of the recent neuropsychological evaluation.  We reviewed the findings and worked on Dentist as far as Geneticist, molecular.  Below I have included a copy of the reason for service and summary from this evaluation.  The results can be found in their entirety in the patient's EMR dated 06/06/2022.    Reason For Service:      [name] is a 57 year old male referred for neuropsychological evaluation by the patient's primary care provider Tana Conch, MD due to reports of increasing and ongoing memory difficulties (both short-term and long-term memory), difficulties with attention and concentration and staying on task and significant intrusive tinnitus.  The patient describes difficulties with memory and attention/concentration that are persistent for a long time.  The patient reports that he first started noticing difficulties, that were minor initially, after returning from his [employer] where he served in [facility] [employer].  The patient was an Retail banker, stationed in theater and was potentially exposed to numerous toxic agents, including gases from burning oil fields, solvent exposure as part of his job duties within [employer] and there have been concerns of exposure to serin gas at times.  Patient denies any tremors or tic type symptoms.   Patient reports that these difficulties have become progressively worse and have  particularly become worse in the past 2 to 5 years.  Blood work/laboratory studies have been within normal limits with the exception of reduced B12, which he is now supplementing with.  The patient is also taking niacin as well.  He felt that supplementing the B12 was somewhat helpful.  The patient has had a workup in 2019 for reversible type causes of cognitive issues including B12 and B vitamin workup, MRI brain.  Patient describes difficulties with attention and baseline levels of anxiety and a history of depressive symptoms that have a waxing and waning nature to them.  Patient has been treated with psychotropics for his depression/anxiety symptoms in the past and attempted Wellbutrin previously without significant improvement.  Patient currently takes sertraline and has taken it for some time and feels like it is somewhat helpful but is concerned that it may be covering up some symptoms rather than directly treating them.   Patient denies history of significant concussive events with loss of consciousness or other neurological symptoms outside of attention, memory and executive functioning issues.   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 ? 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      Class: Acute   Hypogonadism in male 01/15/2007   Major depression in full remission (HCC) 01/15/2007   Depression, major, single episode, in partial remission (HCC) 01/15/2007   Hyperlipemia 01/09/2007   NEOP, MALIGNANT, SKIN, TRUNK 10/06/2006   Ulcerative colitis (HCC) 03/04/1992      Onset and Duration of Symptoms: Patient reports first noticing changes in cognition shortly after returning from active service duty where he served during [employer] with primary jobs due to being Retail banker with exposure to associated environmental toxin during his 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  Patient reports that he did not see active combat directly but was in theater with constant threat of Missile attack and missiles being intercepted by [employer] near him.   Progression of Symptoms: Patient reports that he feels like the symptoms have been progressing and has shown particular progression over the past 2 to 5 years.   Triggering Factors: Symptoms first noted shortly after returning from [facility].   Associated Symptoms (e.g., cognitive, emotional, behavioral): Patient describes difficulties with learning new information and attending to information provided at work, difficulties maintaining attention and concentration and worsening of symptoms similar to anxiety and depressive type symptoms.   Additional Tests and Measures from other records:   Neuroimaging Results: Patient had an CT head and MRI brain conducted in July 2020 and a MRI  also conducted earlier in January 2020.  The studies were all normal with no indications of acute process and normal cortical, subcortical vascular findings.  They were initially initiated because of double vision that came on acutely at that time and concern for his cognitive status.   Laboratory Tests: Patient has essentially had normal blood work over the recent past with the exception of low end of normal regarding B12 and has been supplementing B nutrients and B vitamins since.     Impression/Diagnosis:   Overall, the results of the current neuropsychological evaluation are very encouraging.  There were no indications of objective cognitive deficits outside of a very few isolated subtest and components.  In fact, the primary areas that he had difficulty with were associated with increased frustration and likely hypervigilance over the possibility of making mistakes.  The patient tried very hard and when he was successfully doing a challenge maintaining very good motivation and effort.  The memory difficulties did not appear  to be a primary inability to store, organize or retrieve new information but impacts upon cognitive efficiency when emotional distress and frustration are initiated having a particularly negative impact on the efficiency.  The patient did well under recognition/cued recall for memory and it does appear that he is adequately able to store, organize and retrieve new information.  The patient did very well on measures of attention and concentration both visually and auditorily, verbal reasoning and auditory reasoning and problem-solving/executive functioning, information processing speed and overall speed of mental operations, and capacity to sustain attention over time.  There were no indications of impulse control issues or deficits with regard to executive functioning on objective measures.  The patient is not showing patterns of cognitive strengths and weaknesses that would be  consistent with organic brain disorder such as solvent exposure or heavy metal/toxic exposures.  The patient does very well on measures of focus execute abilities and information processing speed etc.  The patient describes a progressive worsening of his symptoms over the past 2 to 5 years but at the same time he has also experienced ongoing symptoms of depression and anxiety and potential underlying PTSD type symptoms.  I suspect that these functional issues and organic changes associated with PTSD are likely playing a causative role in his subjective reports and day-to-day experiences of reduced cognitive efficiency and memory difficulties.  The patient does have previous diagnosis of obstructive sleep apnea and reports that he is compliantly and consistently using his CPAP machine which I would strongly encourage has poor CPAP use/compliance would worsen the symptoms.  The patient is currently taking sertraline without apparent side effects and some improvements in overall symptoms, which would indicate that we are on the right track for managing the anxiety and depressive symptoms.  When I regarding the results I will go into greater depth regarding possible PTSD type symptoms to get a better handle on those possible variables.  I will also sit down with the patient and discuss other possible recommendations for him specifically but at this point I would continue to focus on treating the anxiety and depression as they are the most likely causative factors at play here.  The patient does describe significant difficulties with tinnitus as well and this is very likely heightening the anxiety and tension and we will address how the interplay between anxiety depression and tinnitus can have a functional impact on attention and memory directly.  Diagnosis:    Major depressive disorder, recurrent episode, moderate (HCC)  Generalized anxiety disorder  Memory difficulties  Obstructive sleep apnea  Tinnitus of  both ears  Rule out possibility of posttraumatic stress disorder.   _____________________ Arley Phenix, Psy.D. Clinical Neuropsychologist

## 2022-09-15 ENCOUNTER — Other Ambulatory Visit: Payer: Self-pay | Admitting: Family Medicine

## 2022-09-17 ENCOUNTER — Other Ambulatory Visit: Payer: Self-pay | Admitting: Family Medicine

## 2022-12-02 ENCOUNTER — Encounter: Payer: BC Managed Care – PPO | Admitting: Psychology

## 2022-12-05 DIAGNOSIS — E230 Hypopituitarism: Secondary | ICD-10-CM | POA: Diagnosis not present

## 2022-12-05 DIAGNOSIS — Z125 Encounter for screening for malignant neoplasm of prostate: Secondary | ICD-10-CM | POA: Diagnosis not present

## 2022-12-05 DIAGNOSIS — G4730 Sleep apnea, unspecified: Secondary | ICD-10-CM | POA: Diagnosis not present

## 2023-01-06 ENCOUNTER — Encounter: Payer: Self-pay | Admitting: Family Medicine

## 2023-01-06 ENCOUNTER — Ambulatory Visit: Payer: BC Managed Care – PPO | Admitting: Family Medicine

## 2023-01-06 VITALS — BP 126/84 | HR 75 | Temp 97.3°F | Ht 68.0 in | Wt 223.8 lb

## 2023-01-06 DIAGNOSIS — E785 Hyperlipidemia, unspecified: Secondary | ICD-10-CM

## 2023-01-06 DIAGNOSIS — I10 Essential (primary) hypertension: Secondary | ICD-10-CM | POA: Diagnosis not present

## 2023-01-06 DIAGNOSIS — K51311 Ulcerative (chronic) rectosigmoiditis with rectal bleeding: Secondary | ICD-10-CM | POA: Diagnosis not present

## 2023-01-06 DIAGNOSIS — Q181 Preauricular sinus and cyst: Secondary | ICD-10-CM | POA: Diagnosis not present

## 2023-01-06 NOTE — Patient Instructions (Addendum)
We entered a new referral today- it will go more smoothly if you give them a call tomorrow- they should be able to see referral but if not tell them should be in within a few days [facility] [address] [place], [place] [address] [phone]    Recommended follow up: Return in about 6 months (around 07/06/2023) for physical or sooner if needed.Schedule b4 you leave.

## 2023-01-06 NOTE — Progress Notes (Signed)
Phone [phone] In person visit   Subjective:   [name] is a 57 y.o. year old very pleasant male patient who presents for/with See problem oriented charting Chief Complaint  Patient presents with   Medical Management of Chronic Issues   Depression    Past Medical History-  Patient Active Problem List   Diagnosis Date Noted   Memory difficulties 03/10/2017    Priority: High   Hypercoagulable state (HCC) 04/04/2015    Priority: High   Thyroid nodule 02/01/2014    Priority: High   Chronic pulmonary embolism (HCC)- see discharge summary 01/2014. ? 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    Priority: Low    Class: Acute   NEOP, MALIGNANT, SKIN, TRUNK 10/06/2006    Priority: Low   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   Personal history of pulmonary embolism 08/03/2020   Depression, major, single episode, in partial remission (HCC) 01/15/2007    Medications- reviewed and updated Current Outpatient Medications  Medication Sig Dispense Refill   sertraline (ZOLOFT) 25 MG tablet TAKE 1 TABLET BY MOUTH EVERY DAY 90 tablet 3   tadalafil (CIALIS) 20 MG tablet TAKE 1/2 TO 1 TABLET BY MOUTH EVERY OTHER DAY AS NEEDED FOR ERECTILE DYSFUNCTION 6 tablet 5   testosterone (ANDROGEL) 50 MG/5GM (1%) GEL APPLY 1-1/2 PACKETS ONCE DAILY on shoulder area 300 g 2   XARELTO 20 MG TABS tablet TAKE 1 TABLET BY MOUTH EVERY DAY 90 tablet 3   No current facility-administered medications for this visit.      Objective:  BP 126/84   Pulse 75   Temp (!) 97.3 F (36.3 C)   Ht 5\' 8"  (1.727 m)   Wt 223 lb 12.8 oz (101.5 kg)   SpO2 97%   BMI 34.03 kg/m  Gen: NAD, resting comfortably CV: RRR no murmurs rubs or gallops Lungs: CTAB no crackles, wheeze, rhonchi Ext: minimal edema Skin: warm, dry     Assessment and Plan   % Ulcerative colitis-followed with Dr. Russella Dar in past - but wants to establish in [place] (where he lives) S: Last colonoscopy March 2021 with plan for 2-year repeat -Patient has opted to come off Lialda/mesalamine-recommendations per GI were to continue. He has done well with no rectal bleeding or abdominal pain  A/P: doing well without medications but still needs close follow up- refer back to [facility] location for gastroenterology    #Hypercoagulable state/chronic PE S: Medication: Chronic Xarelto 20 mg.   History: Left leg DVT 02/01/2014 and also with pulmonary embolism and then later had superficial clot in the left GSV but with potential to extend to distal system noted 01/06/2015 so we have opted for lifelong therapy.  Symptoms:no chest pain or shortness of breath, no leg swelling  A/P: chronic PULMONARY EMBOLISM stable on xarelto- continue current medicines    % Low testosterone-patient managed by Dr. Sharl Ma of [facility] endocrinology- saw him last month and levels were looking good  #  Depression S: Medication: Zoloft 25Mg     01/06/2023    3:05 PM 07/04/2022    9:05 AM 08/14/2021    3:32 PM  Depression screen PHQ 2/9  Decreased Interest 0 0 0  Down, Depressed, Hopeless 0 0 0  PHQ - 2 Score 0 0 0  Altered sleeping 0 0 0  Tired, decreased energy 0 0 0  Change in appetite 0 0 0  Feeling bad or failure about yourself  0 0 0  Trouble concentrating 0 0 0  Moving slowly or fidgety/restless 0 0 0  Suicidal thoughts 0 0 0  PHQ-9 Score 0 0 0  Difficult doing work/chores Not difficult at all Not difficult at all Not difficult at all  A/P: full remission- continue  current medications    #hyperlipidemia S: Medication:None -10-year ASCVD risk slightly above 7.5% -He prefers to work on lifestyle.  Has declined coronary calcium scoring Lab Results  Component Value Date   CHOL 208 (H) 07/11/2022   HDL 30.90 (L) 07/11/2022   LDLCALC 151 (H) 05/10/2013   LDLDIRECT 117.0 07/11/2022   TRIG (H) 07/11/2022    406.0 Triglyceride is over 400; calculations on Lipids are invalid.   CHOLHDL 7 07/11/2022   A/P: doing very well with weight loss- we suspect triglyceride(s) improving- he wants to contiue to work on this and recheck 10 year arteriosclerotic cardiovascular disease risk and lipid levels at physical in 6 months   #hypertension S: medication: none- after prior weight loss has been able to stay off meds  -down 11 lbs from last visit- reports had lost even 20 lbs with a lot of walking in [place] plus he had started replacing lunch with herbalife meal replacement instead of eating out.  -plus walks 2.66 miles at lunch everyday and 3 miles on Saturdays BP Readings from Last 3 Encounters:  01/06/23 126/84  07/04/22 138/82  10/26/21 110/78  A/P: blood pressure well controlled continue without medicine.   #Prior piercing in left ear- in last 6 months has noted swelling a few times- the first it swole up and then eventually burst with manipulation with just blood. Has had several recurrence with drainage of blood - suspect this is a cyst  (presumed) maybe 2-3 x 2-3 mm- offered ENT referral and he opts in   Recommended follow up: Return in about 6 months (around 07/06/2023) for physical or sooner if needed.Schedule b4 you leave.  Lab/Order associations:   ICD-10-CM   1. Ulcerative rectosigmoiditis with rectal bleeding [facility])  K51.311 Ambulatory referral to Gastroenterology      No orders of the defined types were placed in this encounter.   Return precautions advised.  Tana Conch, MD

## 2023-01-07 ENCOUNTER — Encounter (INDEPENDENT_AMBULATORY_CARE_PROVIDER_SITE_OTHER): Payer: Self-pay | Admitting: Otolaryngology

## 2023-01-09 DIAGNOSIS — E230 Hypopituitarism: Secondary | ICD-10-CM | POA: Diagnosis not present

## 2023-01-27 ENCOUNTER — Encounter: Payer: Self-pay | Admitting: Family

## 2023-01-27 ENCOUNTER — Ambulatory Visit (INDEPENDENT_AMBULATORY_CARE_PROVIDER_SITE_OTHER): Payer: BC Managed Care – PPO | Admitting: Family

## 2023-01-27 VITALS — BP 136/81 | HR 69 | Temp 98.0°F | Ht 68.0 in | Wt 221.1 lb

## 2023-01-27 DIAGNOSIS — D229 Melanocytic nevi, unspecified: Secondary | ICD-10-CM

## 2023-01-27 NOTE — Progress Notes (Signed)
Patient ID: [name], male    DOB: Dec 30, 1965, 57 y.o.   MRN: [id number]  Chief Complaint  Patient presents with   Rash    Pt c/o rash on back of right shoulder, present for 1 week. Pt states rash is itchy.    Discussed the use of AI scribe software for clinical note transcription with the patient, who gave verbal consent to proceed.  History of Present Illness   The patient, with a history of sun exposure and potential sunburns, presents with a new, itchy skin mole on his upper right back that has been present for about two weeks. The irritated area started as a small patch and has developed a scab-like appearance & feel. The patient denies any previous awareness of a mole or skin lesion in this area. He has been applying testosterone gel topically and has been avoiding the area of the rash. The patient has not previously been established with dermatology.     Assessment & Plan:     Atypical mole - New pruritic lesion on the back, present for two weeks. Likely seborrheic keratosis, but given the patient's history of sun exposure and the recent change, a dermatology referral is warranted for further evaluation. -Refer to dermatology for evaluation and possible biopsy. -Apply a thin layer of OTC hydrocortisone cream for itching and cover with a hydrating cream such as Aquaphor, CeraVe, or Cetaphil for dryness.  -Pictures taken for chart & referral access.  Subjective:    Outpatient Medications Prior to Visit  Medication Sig Dispense Refill   sertraline (ZOLOFT) 25 MG tablet TAKE 1 TABLET BY MOUTH EVERY DAY 90 tablet 3   tadalafil (CIALIS) 20 MG tablet TAKE 1/2 TO 1 TABLET BY MOUTH EVERY OTHER DAY AS NEEDED FOR ERECTILE DYSFUNCTION 6 tablet 5   testosterone (ANDROGEL) 50 MG/5GM (1%) GEL APPLY 1-1/2 PACKETS ONCE DAILY on shoulder area 300 g 2   XARELTO 20 MG TABS tablet TAKE 1 TABLET BY MOUTH EVERY DAY 90 tablet 3   No facility-administered medications prior to visit.   Past  Medical History:  Diagnosis Date   Anal fissure    Anxiety    Depression    DVT (deep venous thrombosis) (HCC)    Hypertension, essential 12/05/2017   Hypogonadism male    Multinodular goiter    Pulmonary embolism (HCC)    Sleep apnea with use of continuous positive airway pressure (CPAP)    Tubular adenoma of colon    Ulcerative colitis, unspecified    Past Surgical History:  Procedure Laterality Date   MANDIBULAR HARDWARE REMOVAL Bilateral 07/10/2012   Procedure: BILATERAL MANDIBULAR HARDWARE REMOVAL;  Surgeon: Osborn Coho, MD;  Location: [facility] OR;  Service: ENT;  Laterality: Bilateral;   ORIF MANDIBULAR FRACTURE Left 05/09/2012   Procedure: OPEN REDUCTION INTERNAL FIXATION (ORIF) RIGHT ANTERIOR MANDIBULAR FRACTURE; Open Reduction Internal Fixation of Midface fracture; Reduction of septal fracture; arch bars; Repair of lacerations;  Surgeon: Osborn Coho, MD;  Location: [facility] OR;  Service: ENT;  Laterality: Left;   TRACHEOSTOMY TUBE PLACEMENT N/A 05/09/2012   Procedure: TRACHEOSTOMY;  Surgeon: Osborn Coho, MD;  Location: [facility] OR;  Service: ENT;  Laterality: N/A;   Allergies  Allergen Reactions   Flagyl [Metronidazole] Other (See Comments)    REACTION: Fatigue      Objective:    Physical Exam Vitals and nursing note reviewed.  Constitutional:      General: He is not in acute distress.    Appearance: Normal appearance.  HENT:  Head: Normocephalic.  Cardiovascular:     Rate and Rhythm: Normal rate and regular rhythm.  Pulmonary:     Effort: Pulmonary effort is normal.     Breath sounds: Normal breath sounds.  Musculoskeletal:        General: Normal range of motion.     Cervical back: Normal range of motion.  Skin:    General: Skin is warm and dry.     Findings: Lesion (tan, round mole, approx 1.5cm in diameter, with sandpaper surface, picture in EMR) present.     Comments: pt has a large amount of varying sized moles and lesions all up & down back, picture in EMR.   Neurological:     Mental Status: He is alert and oriented to person, place, and time.  Psychiatric:        Mood and Affect: Mood normal.    BP 136/81 (BP Location: Left Arm, Patient Position: Sitting, Cuff Size: Large)   Pulse 69   Temp 98 F (36.7 C) (Temporal)   Ht 5\' 8"  (1.727 m)   Wt 221 lb 2 oz (100.3 kg)   SpO2 98%   BMI 33.62 kg/m  Wt Readings from Last 3 Encounters:  01/27/23 221 lb 2 oz (100.3 kg)  01/06/23 223 lb 12.8 oz (101.5 kg)  07/04/22 234 lb 9.6 oz (106.4 kg)       Dulce Sellar, NP

## 2023-01-28 ENCOUNTER — Ambulatory Visit (INDEPENDENT_AMBULATORY_CARE_PROVIDER_SITE_OTHER): Payer: BC Managed Care – PPO | Admitting: Otolaryngology

## 2023-01-28 ENCOUNTER — Encounter (INDEPENDENT_AMBULATORY_CARE_PROVIDER_SITE_OTHER): Payer: Self-pay

## 2023-01-28 VITALS — Ht 68.0 in | Wt 214.0 lb

## 2023-01-28 DIAGNOSIS — Q181 Preauricular sinus and cyst: Secondary | ICD-10-CM

## 2023-01-28 DIAGNOSIS — H9313 Tinnitus, bilateral: Secondary | ICD-10-CM

## 2023-01-28 DIAGNOSIS — H918X3 Other specified hearing loss, bilateral: Secondary | ICD-10-CM

## 2023-01-28 NOTE — Progress Notes (Signed)
Dear Dr. Durene Cal, Here is my assessment for our mutual patient, [name]. Thank you for allowing me the opportunity to care for your patient. Please do not hesitate to contact me should you have any other questions. Sincerely, Dr. Jovita Kussmaul  Otolaryngology Clinic Note Referring provider: Dr. Durene Cal HPI:  [name] is a 57 y.o. male kindly referred by Dr. Durene Cal for evaluation of left ear lobe cyst.  Patient reports: that he has had a history of recurrent left ear lobe swelling for several months at least. No obvious antecedent event. He used to have ears pierced and wear earrings but not for past several years. He will have intermittent swelling in the area, and then express some serosanguinous fluid but never cheesy or purulent. It is not bothersome otherwise. No pain, redness, or problems with other ear or kidneys. He has never gotten antibiotics for this. It is currently stable in size after his last self-drainage/expression of the area.  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 He has history of significant noise exposure in [employer] technical [employer] prior.    H&N Surgery: denies Personal or FHx of bleeding dz or anesthesia difficulty: no   AP/AC: Xarelto Tobacco: denies  PMHx: UC, Hypercoagulable state/PE (on Xarelto), Depression, HLD, HTN  Independent Review of Additional Tests or Records:  [facility] 09/2018: mastoids, ME well aerated; no significant paranasal sinus disease; no significant large earlobe cysts MRI 09/2018: same as CTH; no obvious large retrocochlear lesions  PMH/Meds/All/SocHx/FamHx/ROS:   Past Medical History:  Diagnosis Date   Anal  fissure    Anxiety    Depression    DVT (deep venous thrombosis) (HCC)    Hypertension, essential 12/05/2017   Hypogonadism male    Multinodular goiter    Pulmonary embolism (HCC)    Sleep apnea with use of continuous positive airway pressure (CPAP)    Tubular adenoma of colon    Ulcerative colitis, unspecified      Past Surgical History:  Procedure Laterality Date   MANDIBULAR HARDWARE REMOVAL Bilateral 07/10/2012   Procedure: BILATERAL MANDIBULAR HARDWARE REMOVAL;  Surgeon: Osborn Coho, MD;  Location: [facility] OR;  Service: ENT;  Laterality: Bilateral;   ORIF MANDIBULAR FRACTURE Left 05/09/2012   Procedure: OPEN REDUCTION INTERNAL FIXATION (ORIF) RIGHT ANTERIOR MANDIBULAR FRACTURE; Open Reduction Internal Fixation of Midface fracture; Reduction of septal fracture; arch bars; Repair of lacerations;  Surgeon: Osborn Coho, MD;  Location: [facility] OR;  Service: ENT;  Laterality: Left;   TRACHEOSTOMY TUBE PLACEMENT N/A 05/09/2012   Procedure: TRACHEOSTOMY;  Surgeon: Osborn Coho, MD;  Location: [facility] OR;  Service: ENT;  Laterality: N/A;    Family History  Problem Relation Age of Onset   Arthritis Mother    Irritable bowel syndrome Mother    COPD Father        lost dad in 2019   Healthy Brother    Healthy Brother    Lung cancer Paternal Grandmother    Colon cancer Neg Hx    Esophageal cancer Neg Hx    Rectal cancer Neg Hx      Social Connections: Moderately Isolated (01/26/2023)   Social Connection and Isolation Panel [NHANES]    Frequency of Communication with Friends and Family: Once a week    Frequency  of Social Gatherings with Friends and Family: Once a week    Attends Religious Services: More than 4 times per year    Active Member of [employer] or Organizations: No    Attends Engineer, structural: Not on file    Marital Status: Married      Current Outpatient Medications:    sertraline (ZOLOFT) 25 MG tablet, TAKE 1 TABLET BY MOUTH EVERY DAY, Disp: 90 tablet, Rfl: 3    tadalafil (CIALIS) 20 MG tablet, TAKE 1/2 TO 1 TABLET BY MOUTH EVERY OTHER DAY AS NEEDED FOR ERECTILE DYSFUNCTION, Disp: 6 tablet, Rfl: 5   testosterone (ANDROGEL) 50 MG/5GM (1%) GEL, APPLY 1-1/2 PACKETS ONCE DAILY on shoulder area, Disp: 300 g, Rfl: 2   XARELTO 20 MG TABS tablet, TAKE 1 TABLET BY MOUTH EVERY DAY, Disp: 90 tablet, Rfl: 3   Physical Exam:   Ht 5\' 8"  (1.727 m)   Wt 214 lb (97.1 kg)   BMI 32.54 kg/m   Salient findings:  CN II-XII intact  Bilateral EAC clear and TM intact with well pneumatized middle ear spaces Weber 512: midline Rinne 512: AC > BC b/l  Left ear lobe with small 3x38mm nodularity, borders easily palpable; no overlying erythema, no tenderness. Old piercing overlying. No nodularity over right ear lobe Anterior rhinoscopy: Septum relatively midline; bilateral inferior turbinates without significant hypertrophy No lesions of oral cavity/oropharynx No obviously palpable neck masses/lymphadenopathy/thyromegaly No respiratory distress or stridor  Seprately Identifiable Procedures:  None  Impression & Plans:  [name] is a 57 y.o. male with: 1. Ear cysts   2. Other specified hearing loss of both ears   3. Tinnitus of both ears   Left earlobe cyst. Not painful. Query just trauma related from prior piercing. Quite small. Epidermoid v/s just a simple cyst. He has not had an episode of infection despite self-draining. Do not think this is related to a congenital issue or part of systemic issue given history.  We discussed options: 1. Observation 2. Removal with primary closure  Given lack of problems and infections, he wishes to observe for now. I advised that in case anything changes, I'm happy to see him back for removal  Tinnitus and HL: gets audios every year at work; discussed audio here and tinnitus strategies, he wished to forego further testing  See below regarding exact medications prescribed this encounter including dosages and route: No orders of  the defined types were placed in this encounter.     Thank you for allowing me the opportunity to care for your patient. Please do not hesitate to contact me should you have any other questions.  Sincerely, Jovita Kussmaul, MD Otolarynoglogist (ENT), [facility] Phone: [phone] Fax: [phone]  01/28/2023, 4:25 PM   MDM:  Level 3: [phone] Complexity/Problems addressed: low - chronic problem Data complexity: low - review of results and tests - Morbidity: low

## 2023-01-29 ENCOUNTER — Encounter: Payer: BC Managed Care – PPO | Admitting: Psychology

## 2023-05-10 ENCOUNTER — Other Ambulatory Visit: Payer: Self-pay | Admitting: Family Medicine

## 2023-05-14 DIAGNOSIS — E042 Nontoxic multinodular goiter: Secondary | ICD-10-CM | POA: Diagnosis not present

## 2023-05-14 DIAGNOSIS — G4730 Sleep apnea, unspecified: Secondary | ICD-10-CM | POA: Diagnosis not present

## 2023-05-14 DIAGNOSIS — Z86711 Personal history of pulmonary embolism: Secondary | ICD-10-CM | POA: Diagnosis not present

## 2023-05-14 DIAGNOSIS — Z125 Encounter for screening for malignant neoplasm of prostate: Secondary | ICD-10-CM | POA: Diagnosis not present

## 2023-05-14 DIAGNOSIS — R7303 Prediabetes: Secondary | ICD-10-CM | POA: Diagnosis not present

## 2023-05-14 DIAGNOSIS — E230 Hypopituitarism: Secondary | ICD-10-CM | POA: Diagnosis not present

## 2023-05-14 LAB — TESTOSTERONE: Testosterone: 385

## 2023-05-14 LAB — HEPATIC FUNCTION PANEL
ALT: 36 U/L (ref 10–40)
AST: 23 (ref 14–40)
Bilirubin, Direct: 0.1
Bilirubin, Total: 0.6

## 2023-05-14 LAB — TSH: TSH: 1.6 (ref 0.41–5.90)

## 2023-05-14 LAB — HEMOGLOBIN A1C: Hemoglobin A1C: 5.6

## 2023-05-14 LAB — CBC: RBC: 5.47 — AB (ref 3.87–5.11)

## 2023-05-14 LAB — COMPREHENSIVE METABOLIC PANEL: Albumin: 4.5 (ref 3.5–5.0)

## 2023-05-14 LAB — CBC AND DIFFERENTIAL
Hemoglobin: 16.8 (ref 13.5–17.5)
WBC: 7

## 2023-07-07 ENCOUNTER — Encounter: Payer: BC Managed Care – PPO | Admitting: Family Medicine

## 2023-09-07 ENCOUNTER — Other Ambulatory Visit: Payer: Self-pay | Admitting: Family Medicine

## 2023-09-08 ENCOUNTER — Ambulatory Visit: Payer: Self-pay

## 2023-09-08 ENCOUNTER — Ambulatory Visit: Admitting: Family

## 2023-09-08 DIAGNOSIS — R0781 Pleurodynia: Secondary | ICD-10-CM | POA: Diagnosis not present

## 2023-09-08 DIAGNOSIS — R5383 Other fatigue: Secondary | ICD-10-CM | POA: Diagnosis not present

## 2023-09-08 DIAGNOSIS — R079 Chest pain, unspecified: Secondary | ICD-10-CM | POA: Diagnosis not present

## 2023-09-08 DIAGNOSIS — R0602 Shortness of breath: Secondary | ICD-10-CM | POA: Diagnosis not present

## 2023-09-08 DIAGNOSIS — R001 Bradycardia, unspecified: Secondary | ICD-10-CM | POA: Diagnosis not present

## 2023-09-08 NOTE — Telephone Encounter (Signed)
 FYI Only or Action Required?: FYI only for provider.  Patient was last seen in primary care on 01/27/2023 by Lucius Krabbe, NP. Called Nurse Triage reporting Respiratory Distress. Symptoms began several weeks ago. Interventions attempted: Prescription medications: Xarelto . Symptoms are: gradually worsening.  Triage Disposition: Go to ED Now (Notify PCP)  Patient/caregiver understands and will follow disposition?: Yes                                    Katie from [facility] called NT to have this patient triaged for difficulty breathing and heart palpitations.  Reason for Disposition  History of prior blood clot in leg or lungs (i.e., deep vein thrombosis, pulmonary embolism)  Answer Assessment - Initial Assessment Questions 1. RESPIRATORY STATUS: Describe your breathing? (e.g., wheezing, shortness of breath, unable to speak, severe coughing)      I feel like every so often, I need to breathe a little deeper 2. ONSET: When did this breathing problem begin?      Ongoing for weeks, worsened this morning 3. PATTERN Does the difficult breathing come and go, or has it been constant since it started?      No necessarily constant- some days are more noticeable than others  4. SEVERITY: How bad is your breathing? (e.g., mild, moderate, severe)    - MILD: No SOB at rest, mild SOB with walking, speaks normally in sentences, can lie down, no retractions, pulse < 100.    - MODERATE: SOB at rest, SOB with minimal exertion and prefers to sit, cannot lie down flat, speaks in phrases, mild retractions, audible wheezing, pulse 100-120.    - SEVERE: Very SOB at rest, speaks in single words, struggling to breathe, sitting hunched forward, retractions, pulse > 120      States she has not noticed correlation between SOB and exertion, laying flat, at rest, etc. 5. RECURRENT SYMPTOM: Have you had difficulty breathing before? If Yes, ask: When was the  last time? and What happened that time?      Yes, states he presented with similar (more severe) symptoms in 2014 due to blood clots 6. CARDIAC HISTORY: Do you have any history of heart disease? (e.g., heart attack, angina, bypass surgery, angioplasty)      Denies 7. LUNG HISTORY: Do you have any history of lung disease?  (e.g., pulmonary embolus, asthma, emphysema)     History of blood clots following surgery  8. CAUSE: What do you think is causing the breathing problem?      Unsure 9. OTHER SYMPTOMS: Do you have any other symptoms? (e.g., dizziness, runny nose, cough, chest pain, fever)     Denies swelling, denies redness, denies chest pain, every so often it feels like my heart skips a beat  10. O2 SATURATION MONITOR:  Do you use an oxygen saturation monitor (pulse oximeter) at home? If Yes, ask: What is your reading (oxygen level) today? What is your usual oxygen saturation reading? (e.g., 95%)     Denies, does not have a means to check  History of blood clots following surgery in 2014- currently taking Xarelto   Protocols used: Breathing Difficulty-A-AH

## 2023-09-08 NOTE — Telephone Encounter (Signed)
 Noted

## 2023-09-09 DIAGNOSIS — R001 Bradycardia, unspecified: Secondary | ICD-10-CM | POA: Diagnosis not present

## 2023-09-09 DIAGNOSIS — R079 Chest pain, unspecified: Secondary | ICD-10-CM | POA: Diagnosis not present

## 2023-11-12 DIAGNOSIS — Z86711 Personal history of pulmonary embolism: Secondary | ICD-10-CM | POA: Diagnosis not present

## 2023-11-12 DIAGNOSIS — Z125 Encounter for screening for malignant neoplasm of prostate: Secondary | ICD-10-CM | POA: Diagnosis not present

## 2023-11-12 DIAGNOSIS — G4730 Sleep apnea, unspecified: Secondary | ICD-10-CM | POA: Diagnosis not present

## 2023-11-12 DIAGNOSIS — E230 Hypopituitarism: Secondary | ICD-10-CM | POA: Diagnosis not present

## 2023-12-07 ENCOUNTER — Other Ambulatory Visit: Payer: Self-pay | Admitting: Family Medicine

## 2024-01-02 ENCOUNTER — Telehealth (INDEPENDENT_AMBULATORY_CARE_PROVIDER_SITE_OTHER): Payer: Self-pay

## 2024-01-02 NOTE — Telephone Encounter (Signed)
Attempted to reach patient for PEC telephone interview.  Patient/family provided with NPO & parking instructions, day of surgery contact phone number, and instructions to call surgeon's office with any changes to medical condition.  In addition, informed patient/family that there should be a responsible adult to escort patient home after surgery/procedure.

## 2024-01-06 ENCOUNTER — Encounter: Payer: Self-pay | Admitting: Podiatrist

## 2024-01-06 NOTE — Anesthesia Preprocedure Evaluation (Addendum)
 Anesthesia Evaluation    AIRWAY    Mallampati: II    TM distance: >3 FB  Neck ROM: full  Mouth Opening:full   CARDIOVASCULAR    cardiovascular exam normal, regular and normal       DENTAL    no notable dental hx               PULMONARY    pulmonary exam normal and clear to auscultation     OTHER FINDINGS                                                    Anesthesia Plan    ASA 3     general                     Detailed anesthesia plan: general LMA        Post op pain management: per surgeon and PNB single shot    informed consent obtained    Plan discussed with CRNA.                     Signed by: Rome JULIANNA Santos, MD PhD 01/06/24 2:28 PM

## 2024-01-07 ENCOUNTER — Ambulatory Visit: Admitting: Anesthesiology

## 2024-01-07 ENCOUNTER — Encounter: Admission: RE | Disposition: A | Payer: Self-pay | Source: Ambulatory Visit | Attending: Podiatrist

## 2024-01-07 ENCOUNTER — Other Ambulatory Visit: Payer: Self-pay

## 2024-01-07 ENCOUNTER — Ambulatory Visit

## 2024-01-07 ENCOUNTER — Ambulatory Visit
Admission: RE | Admit: 2024-01-07 | Discharge: 2024-01-07 | Disposition: A | Discharge status: Home / Self Care | Source: Ambulatory Visit | Attending: Podiatrist | Admitting: Podiatrist

## 2024-01-07 ENCOUNTER — Encounter: Payer: Self-pay | Admitting: Podiatrist

## 2024-01-07 DIAGNOSIS — M25771 Osteophyte, right ankle: Secondary | ICD-10-CM | POA: Insufficient documentation

## 2024-01-07 DIAGNOSIS — F17210 Nicotine dependence, cigarettes, uncomplicated: Secondary | ICD-10-CM | POA: Insufficient documentation

## 2024-01-07 DIAGNOSIS — M19071 Primary osteoarthritis, right ankle and foot: Secondary | ICD-10-CM | POA: Insufficient documentation

## 2024-01-07 DIAGNOSIS — Y838 Other surgical procedures as the cause of abnormal reaction of the patient, or of later complication, without mention of misadventure at the time of the procedure: Secondary | ICD-10-CM | POA: Insufficient documentation

## 2024-01-07 DIAGNOSIS — T8484XA Pain due to internal orthopedic prosthetic devices, implants and grafts, initial encounter: Secondary | ICD-10-CM | POA: Insufficient documentation

## 2024-01-07 DIAGNOSIS — M25871 Other specified joint disorders, right ankle and foot: Secondary | ICD-10-CM | POA: Insufficient documentation

## 2024-01-07 DIAGNOSIS — M21961 Unspecified acquired deformity of right lower leg: Secondary | ICD-10-CM | POA: Insufficient documentation

## 2024-01-07 HISTORY — PX: ARTHROTOMY, ANKLE, WITH SYNOVECTOMY: SHX50779

## 2024-01-07 HISTORY — DX: Sleep apnea, unspecified: G47.30

## 2024-01-07 HISTORY — PX: REPAIR, ANKLE LIGAMENT: SHX5193

## 2024-01-07 HISTORY — PX: EXCISION, OSTEOPHYTES (BONE SPUR): SHX4002

## 2024-01-07 HISTORY — DX: Gastro-esophageal reflux disease without esophagitis: K21.9

## 2024-01-07 SURGERY — REPAIR, ANKLE LIGAMENT
Anesthesia: Anesthesia General | Laterality: Right | Wound class: Clean

## 2024-01-07 MED ORDER — ONDANSETRON HCL 4 MG/2ML IJ SOLN
INTRAMUSCULAR | Status: AC
Start: 2024-01-07 — End: 2024-01-07
  Filled 2024-01-07: qty 2

## 2024-01-07 MED ORDER — MIDAZOLAM HCL 1 MG/ML IJ SOLN (WRAP)
2.0000 mg | Freq: Once | INTRAMUSCULAR | Status: AC
Start: 2024-01-07 — End: 2024-01-07
  Administered 2024-01-07: 2 mg via INTRAVENOUS

## 2024-01-07 MED ORDER — SODIUM CHLORIDE (PF) 0.9 % IJ SOLN
INTRAMUSCULAR | Status: AC
Start: 2024-01-07 — End: 2024-01-07
  Filled 2024-01-07: qty 10

## 2024-01-07 MED ORDER — KETOROLAC TROMETHAMINE 60 MG/2ML IM SOLN
INTRAMUSCULAR | Status: AC
Start: 2024-01-07 — End: 2024-01-07
  Filled 2024-01-07: qty 2

## 2024-01-07 MED ORDER — OXYCODONE HCL 5 MG PO TABS
5.0000 mg | ORAL_TABLET | Freq: Once | ORAL | Status: DC | PRN
Start: 2024-01-07 — End: 2024-01-07

## 2024-01-07 MED ORDER — PHENYLEPHRINE 100 MCG/ML IV SOSY (WRAP)
PREFILLED_SYRINGE | INTRAVENOUS | Status: AC
Start: 2024-01-07 — End: 2024-01-07
  Filled 2024-01-07: qty 10

## 2024-01-07 MED ORDER — LIDOCAINE HCL (PF) 1 % IJ SOLN
INTRAMUSCULAR | Status: AC
Start: 2024-01-07 — End: 2024-01-07
  Filled 2024-01-07: qty 5

## 2024-01-07 MED ORDER — FENTANYL CITRATE (PF) 50 MCG/ML IJ SOLN (WRAP)
INTRAMUSCULAR | Status: AC
Start: 2024-01-07 — End: 2024-01-07
  Filled 2024-01-07: qty 2

## 2024-01-07 MED ORDER — LIDOCAINE HCL 2 % IJ SOLN
INTRAMUSCULAR | Status: DC | PRN
Start: 2024-01-07 — End: 2024-01-07
  Administered 2024-01-07: 100 mg

## 2024-01-07 MED ORDER — LIDOCAINE HCL (PF) 2 % IJ SOLN
INTRAMUSCULAR | Status: AC
Start: 2024-01-07 — End: 2024-01-07
  Filled 2024-01-07: qty 5

## 2024-01-07 MED ORDER — LIDOCAINE HCL (PF) 1 % IJ SOLN
5.0000 mL | Freq: Once | INTRAMUSCULAR | Status: AC
Start: 2024-01-07 — End: 2024-01-07
  Administered 2024-01-07 (×2): 5 mL

## 2024-01-07 MED ORDER — FAMOTIDINE 10 MG/ML IV SOLN (WRAP)
20.0000 mg | Freq: Once | INTRAVENOUS | Status: AC
Start: 2024-01-07 — End: 2024-01-07

## 2024-01-07 MED ORDER — MIDAZOLAM HCL 1 MG/ML IJ SOLN (WRAP)
INTRAMUSCULAR | Status: AC
Start: 2024-01-07 — End: 2024-01-07
  Filled 2024-01-07: qty 2

## 2024-01-07 MED ORDER — HYDROMORPHONE HCL 1 MG/ML IJ SOLN
0.5000 mg | INTRAMUSCULAR | Status: DC | PRN
Start: 2024-01-07 — End: 2024-01-07

## 2024-01-07 MED ORDER — METOCLOPRAMIDE HCL 5 MG/ML IJ SOLN
10.0000 mg | Freq: Once | INTRAMUSCULAR | Status: DC | PRN
Start: 2024-01-07 — End: 2024-01-07

## 2024-01-07 MED ORDER — CEFAZOLIN SODIUM 2 G IJ SOLR
INTRAMUSCULAR | Status: DC
Start: 2024-01-07 — End: 2024-01-07
  Filled 2024-01-07: qty 2000

## 2024-01-07 MED ORDER — PROPOFOL 10 MG/ML IV EMUL (WRAP)
INTRAVENOUS | Status: DC | PRN
Start: 2024-01-07 — End: 2024-01-07
  Administered 2024-01-07: 200 mg via INTRAVENOUS

## 2024-01-07 MED ORDER — PROPOFOL 10 MG/ML IV EMUL (WRAP)
INTRAVENOUS | Status: AC
Start: 2024-01-07 — End: 2024-01-07
  Filled 2024-01-07: qty 20

## 2024-01-07 MED ORDER — DEXAMETHASONE SODIUM PHOSPHATE 20 MG/5ML IJ SOLN
INTRAMUSCULAR | Status: AC
Start: 2024-01-07 — End: 2024-01-07
  Filled 2024-01-07: qty 5

## 2024-01-07 MED ORDER — CALCIUM CARBONATE ANTACID 500 MG PO CHEW
500.0000 mg | CHEWABLE_TABLET | Freq: Once | ORAL | Status: DC | PRN
Start: 2024-01-07 — End: 2024-01-07

## 2024-01-07 MED ORDER — ROPIVACAINE HCL 5 MG/ML IJ SOLN
INTRAMUSCULAR | Status: AC
Start: 2024-01-07 — End: 2024-01-07
  Filled 2024-01-07: qty 30

## 2024-01-07 MED ORDER — ROPIVACAINE HCL 5 MG/ML IJ SOLN
Freq: Once | INTRAMUSCULAR | Status: AC | PRN
Start: 2024-01-07 — End: 2024-01-07
  Administered 2024-01-07: 30 mL via PERINEURAL

## 2024-01-07 MED ORDER — BENZOCAINE-MENTHOL MT LOZG (WRAP)
1.0000 | LOZENGE | OROMUCOSAL | Status: DC | PRN
Start: 2024-01-07 — End: 2024-01-07

## 2024-01-07 MED ORDER — EPHEDRINE SULFATE 50 MG/ML IJ/IV SOLN (WRAP)
Status: AC
Start: 2024-01-07 — End: 2024-01-07
  Filled 2024-01-07: qty 1

## 2024-01-07 MED ORDER — FAMOTIDINE 10 MG/ML IV SOLN (WRAP)
INTRAVENOUS | Status: AC
Start: 2024-01-07 — End: 2024-01-07
  Administered 2024-01-07: 20 mg via INTRAVENOUS
  Filled 2024-01-07: qty 2

## 2024-01-07 MED ORDER — ONDANSETRON HCL 4 MG/2ML IJ SOLN
4.0000 mg | Freq: Once | INTRAMUSCULAR | Status: DC | PRN
Start: 2024-01-07 — End: 2024-01-07

## 2024-01-07 MED ORDER — ONDANSETRON HCL 4 MG/2ML IJ SOLN
INTRAMUSCULAR | Status: DC | PRN
Start: 2024-01-07 — End: 2024-01-07
  Administered 2024-01-07: 4 mg via INTRAVENOUS

## 2024-01-07 MED ORDER — KETOROLAC TROMETHAMINE 30 MG/ML IJ SOLN
INTRAMUSCULAR | Status: DC | PRN
Start: 2024-01-07 — End: 2024-01-07
  Administered 2024-01-07: 30 mg via INTRAVENOUS

## 2024-01-07 MED ORDER — FENTANYL CITRATE (PF) 50 MCG/ML IJ SOLN (WRAP)
25.0000 ug | INTRAMUSCULAR | Status: DC | PRN
Start: 2024-01-07 — End: 2024-01-07

## 2024-01-07 MED ORDER — ACETAMINOPHEN 500 MG PO TABS
ORAL_TABLET | ORAL | Status: AC
Start: 2024-01-07 — End: 2024-01-07
  Administered 2024-01-07: 1000 mg via ORAL
  Filled 2024-01-07: qty 2

## 2024-01-07 MED ORDER — FENTANYL CITRATE (PF) 50 MCG/ML IJ SOLN (WRAP)
INTRAMUSCULAR | Status: DC | PRN
Start: 2024-01-07 — End: 2024-01-07
  Administered 2024-01-07 (×2): 50 ug via INTRAVENOUS

## 2024-01-07 MED ORDER — DEXAMETHASONE SODIUM PHOSPHATE 4 MG/ML IJ SOLN (WRAP)
INTRAMUSCULAR | Status: DC | PRN
Start: 2024-01-07 — End: 2024-01-07
  Administered 2024-01-07: 8 mg via INTRAVENOUS

## 2024-01-07 MED ORDER — LACTATED RINGERS IV SOLN
INTRAVENOUS | Status: DC | PRN
Start: 2024-01-07 — End: 2024-01-07

## 2024-01-07 MED ORDER — ACETAMINOPHEN 500 MG PO TABS
1000.0000 mg | ORAL_TABLET | Freq: Once | ORAL | Status: AC
Start: 2024-01-07 — End: 2024-01-07

## 2024-01-07 MED ORDER — ROPIVACAINE HCL 5 MG/ML IJ SOLN
Freq: Once | INTRAMUSCULAR | Status: AC | PRN
Start: 2024-01-07 — End: 2024-01-07
  Administered 2024-01-07: 25 mL via PERINEURAL

## 2024-01-07 MED ORDER — FENTANYL CITRATE (PF) 50 MCG/ML IJ SOLN (WRAP)
50.0000 ug | Freq: Once | INTRAMUSCULAR | Status: AC
Start: 2024-01-07 — End: 2024-01-07
  Administered 2024-01-07: 50 ug via INTRAVENOUS

## 2024-01-07 MED ORDER — STERILE WATER FOR INJECTION IJ/IV SOLN (WRAP)
2.0000 g | Freq: Once | Status: AC
Start: 2024-01-07 — End: 2024-01-07
  Administered 2024-01-07: 2 g via INTRAVENOUS

## 2024-01-07 SURGICAL SUPPLY — 36 items
ADHESIVE LIQUID WATERPROOF VIAL PREP NONSTAIN MASTISOL STYRAX GUM (Skin Closure) IMPLANT
ANCHOR L26.2 MM .9 MM 2 LOAD TAPER POINT NEEDLE SUTURE BLUE WHITE (Anchor) IMPLANT
APPLICATOR CHLORAPREP 26 ML 70% ISOPROPYL ALCOHOL 2% CHLORHEXIDINE (Applicator) ×2 IMPLANT
BANDAGE ESMARK COMPRESSION L9 FT X W4 IN ELASTIC BLUE (Procedure Accessories) ×1 IMPLANT
BANDAGE GAUZE L3.6 YD X W3.4 IN 6 PLY ABSORBENT STRETCH TIGHT FINISH (Bandage) IMPLANT
BANDAGE PROCARE COMP L5 YD X W4 IN 2 CLIP FASTENER SLF CLSR PLSTR CTTN (Procedure Accessories) ×1 IMPLANT
BLADE 15 CLASSIC CARBON STEEL TISSUE SURGICAL (Blade) IMPLANT
BLADE SAW THK.38 MM MEDIUM THIN OSCILLATE SAGITTAL AGGRESSIVE OFFSET (Blade) IMPLANT
COVER FLEXIBLE LIGHT HANDLE PLASTIC GREEN (Procedure Accessories) ×2 IMPLANT
CUFF TOURNIQUET CYLINDRICAL L18 IN X W4 IN 2 PORT 1 BLADDER SLEEVE ATS (Cuff) ×1 IMPLANT
DRAPE 84X54IN MINI C ARM OEC 6800 DISPOSABLE (Drape) ×1 IMPLANT
DRAPE SURGICAL ADHESIVE L51 IN X W47 IN STERI-DRAPE CLEAR (Drape) IMPLANT
DRAPE SURGICAL ADHESIVE STRIP SMALL TOWEL MATTE FINISH L17 IN X W11 IN (Drape) IMPLANT
DRAPE SURGICAL SHEET FANFOLD L98 IN X W77 IN TIBURON XL PINK (Drape) ×1 IMPLANT
DRESSING PETROLATUM L9 IN X W5 IN NON ADHERENT OCCLUSIVE CURAD BISMUTH (Procedure Accessories) IMPLANT
GLOVE SURGICAL 7 1/2 BIOGEL PI MICRO INDICATOR POWDER FREE SMOOTH BEAD (Glove) ×1 IMPLANT
GLOVE SURGICAL 7.5 BIOGEL PI MICRO POWDER FREE ROUGH BEAD CUFF (Glove) ×2 IMPLANT
KIT ENDOSCOPIC INSTRUMENT DRILL GUIDE GUIDEWIRE OD1.35-1.6 MM FIBERTAK (Kits) IMPLANT
PAD ABDOMINAL L9 IN X W5 IN ABSORBENT NONWOVEN HYDROPHOBIC BACK SEAL (Procedure Accessories) IMPLANT
RASP L11 MM X W5 MM SMALL TPS STAINLESS STEEL SURGICAL TEAR CROSS CUT (Blade) IMPLANT
SOLUTION ANTISEPTIC RUB BOTTLE MEDLINE (Scrub Supplies) ×1 IMPLANT
SOLUTION PREP ANTISEPTIC SCRW BTTL LQD MDLN 10% PVP IODNE 4OZ DRK BRWN (Scrub Supplies) IMPLANT
SPONGE GAUZE COTTON L4 IN X W4 IN 12 PLY WOVEN FOLD EDGE STERILE LATEX FREE (Dressing) ×2 IMPLANT
SPONGE GAUZE L4 IN X W4 IN 12 PLY WOVEN FOLD EDGE XRAY DETECT COTTON (Dressing) ×2 IMPLANT
STAPLER SKIN L4.1 MM X W6.5 MM 35 WIDE STAPLE CARTRIDGE APPOSE ULC (Staplers) IMPLANT
STRIP SKIN CLOSURE L4 IN X W1/2 IN REINFORCE STERI-STRIP POLYESTER (Dressing) IMPLANT
SUTURE COATED VICRYL PLUS 3-0 PS-2 L27 IN BRAID ANTIBACTERIAL COATED (Suture) IMPLANT
SUTURE PROLENE BLUE 3-0 PS-2 L18 IN MONOFILAMENT NONABSORBABLE (Suture) IMPLANT
SYRINGE 10 ML GRADUATE NONPYROGENIC DEHP FREE PVC FREE LOK MEDICAL (Syringes, Needles) ×1 IMPLANT
SYRINGE 20 ML BD LUER-LOK MEDICAL (Syringes, Needles) IMPLANT
SYSTEM WOUND IRRIGATION DEBRIDEMENT CLEANSING IRRISEPT 0.05% (Solution) IMPLANT
TOWEL L27 IN X W17 IN COTTON PREWASH DELINT HIGH ABSORBENT BLUE (Other) ×1 IMPLANT
TRAY SKIN SCRUB L8 IN 6 WING 6 SPONGE STICK 2 TIP APPLICATOR DRY VINYL (Prep) IMPLANT
TRAY SURGICAL EXTREMITY ~~LOC~~ (Pack) ×1 IMPLANT
WIRE FIXATION OD.062 IN L6 IN KIRSCHNER STAINLESS STEEL 2 TROCAR IMPLANT
splint IMPLANT

## 2024-01-07 NOTE — Discharge Instr - AVS First Page (Addendum)
 Foot & Ankle Surgery Patient Discharge Instructions:     Arrange to have an adult drive you home after surgery. If you had general anesthesia, it may take a day or more to fully recover. So, for at least the next 24 hours: Do not drive or use machinery or power tools; do not drink alcohol; and do not make any major decisions.     What to Expect  It is normal to have the following:  Bruising and slight swelling of the foot and toes  A small amount of blood on the dressing    Bandages:  Keep your dressings clean, dry, and intact. Do not remove or change.  When bathing/showering, cover the bandage or cast with a plastic bag to keep it dry.  Don't remove your bandage until your doctor tells you to. If your bandage gets wet or dirty, check with your doctor. You can likely replace it with a clean, dry one.    Activity:  Non weightbearing to operative lower extremity with splint  Sit or lay down when possible. Put a pillow under your heel to raise your foot above the level of your heart.  Wrap an ice pack or bag of frozen peas in a thin cloth. Place it over your bandaged foot for no longer than 20 minutes. Do this three times a day.  You can drive again when instrcuted by your doctor.  Wear your surgical shoe at all times unless told otherwise by your health care provider.  Use crutches or a cane as directed.  Follow your doctor's instructions about putting weight on your foot.    Diet:  Start with liquids and light foods (such as dry toast, bananas, and apple sauce). As you feel up to it, slowly return to your normal diet.  Drink at least six to eight glasses of water or other nonalcoholic fluids a day.  To avoid nausea, eat before taking narcotic pain medications.    Medications:  Take all medications as instructed.  Take pain medications on time. Do not wait until the pain is bad before taking your medications.  Avoid alcohol while on pain medications.    Call your doctor if:  Continuous bleeding through the  bandage  Excessive swelling, increased bleeding, or redness  Fever over 100.80F or chills  Pain unrelieved by pain medications  Foot feels cold to the touch or numb  Chest pain or shortness of breath    Anesthesia Discharge Instructions: After Your Surgery  You've just had surgery. During surgery you were given medicine called anesthesia to keep you relaxed and comfortable. After surgery you may have some pain or nausea. This is common. Your doctor or nurse will show you how to take care of yourself when you go home. He or she will also answer your questions. Here are some tips for feeling better and getting well after surgery.        For the first 24 hours after your surgery:  Do not drive or use heavy equipment.   Do not make important decisions or sign legal papers.  Do not drink alcohol.  Have someone stay with you, if needed. He or she can watch for problems and help keep you safe.  Have an adult family member or friend drive you home.    Managing pain  If you have pain after surgery, pain medicine will help you feel better. Take it as ordered by your surgeon, before pain becomes severe. Also, ask your doctor about other  ways to control pain. This might be with rest, ice, repositioning, and elevation. Follow any other instructions your surgeon or nurse gives you.    Tips for taking pain medicine:    Stay on schedule with your medication  Most pain relievers taken by mouth need at least 20 to 30 minutes to start to work.  Taking medicine on a schedule can help you remember to take it. Try to time your medicine so that you can take it before starting an activity. This might be before you get dressed, go for a walk, or sit down for dinner.    Common side effects of prescription pain medications  Pain medicines can cause nausea. Eat a little food before taking pain medicine to avoid this.  Constipation is a common side effect of pain medicines. Drinking lots of fluids and eating foods, such as fruits and  vegetables, that are high in fiber can also help.   Call your doctor before taking any medicines such as laxatives or stool softeners to help ease constipation. Also, ask if you should avoid any foods. Remember, do not take laxatives unless your surgeon has prescribed them  Drinking alcohol and taking pain medicines can cause dizziness and slow your breathing. It can even be deadly. Do not drink alcohol while taking pain medicines.  Pain medicines can make you react more slowly to things. Do not drive or run machinery while taking pain medicines.    Important facts about Acetaminophen (Tylenol)  Acetaminophen or Tylenol is a common pain reliever.  Check your prescription pain medication labels to see if it contains acetaminophen.  Your health care provider may tell you to take acetaminophen to help ease your pain. Ask him or her how much you should to take each day.   Some prescription pain medicines have acetaminophen and other ingredients. Using both prescription pain medicines and over the counter (OTC) acetaminophen for pain can cause you to overdose.  Max dose of acetaminophen is 4000mg /24 hours for most people. Overdosing on acetaminophen may lead to liver failure.  Read the labels on your (over the counter) medicines with care. This will help you to clearly know the list of ingredients, how much to take, and any warnings. This will prevent you from taking too much acetaminophen.  If you have questions or do not understand the information, ask your pharmacist or health care provider to explain it to you before you take the OTC medicine.    Managing nausea  Some people have an upset stomach after surgery. This is often because of anesthesia, pain, pain medicines, or the stress of surgery. If you were on a special food plan before surgery, ask your doctor if you should follow it while you get better. The following are tips to help you manage nausea after anesthesia:  Do not push yourself to eat. Your body will  tell you when to eat and how much.  Start off with clear liquids and soup. They are easier to digest.  Next try semi-solid foods such as mashed potatoes, applesauce, and gelatin, as you feel ready.  Slowly move to solid foods. Don't eat fatty, rich, or spicy foods at first.  Do not force yourself to have 3 large meals a day. Instead eat smaller amounts more often.  Take pain medicines with a small amount of food, such as crackers or toast, to avoid nausea.     Call your surgeon if:  You have worsening pain an hour after taking pain medicine. The  pain medicine may not be strong enough.  You feel too sleepy, dizzy, or groggy. The pain medicine may be too strong.  You have side effects like persistent nausea, vomiting, or skin changes such as rash, itching, or hives.   You have bleeding through your dressing or your dressing becomes saturated.  You have signs of infection such as redness, fever, or increased/foul smelling drainage.      If you have obstructive sleep apnea (OSA)  You were given anesthesia medicine during surgery to keep you comfortable and free of pain. After surgery, you may have more apnea spells because of this medicine and other medicines you were given. These episodes may last longer than usual.   At home:   Keep using the continuous positive airway pressure (CPAP) device when you sleep. Unless your health care provider tells you not to, use it when you sleep,   or take a nap; day or night. CPAP is a common device used to treat OSA.   *    Sleep/nap in upright position(ie. with pillows), or use a recliner for first week and/or while on opioid medications or medications that are sedating.  *     Another adult needs to be with you at all times during the first 24 hours home.  *     Limit opioid use when possible and use alternative comfort measures.  * Talk with your provider before taking any pain medicine, muscle relaxants, or sedatives. Your provider will tell you about the possible dangers of  taking these medicines.                         [facility]    [facility] AFTER A NERVE BLOCK  Your anesthesiologist has placed a nerve block to reduce pain following your surgery or injury. . Local Anesthetic ("numbing medication") has been injected to numb the nerves that control the pain at the site of your surgery or injury.   WHAT TO EXPECT  It is normal to have numbness or weakness in the part of your body that was injected with the numbing medicine. This will gradually disappear over 20-30 hours.  You may experience some numbness for up to 48 hours.  If numbness persists beyond 48 hours please call the Department of Anesthesia:  [phone]    TAKE YOUR PAIN MEDICATIONS  Even if you have no pain, remember to take your prescribed pain medications before the nerve block wears off. It is possible that the nerve block may wear off while you are asleep.  To prevent discomfort take your first dose of medication before you go to bed.    IF YOU HAD SURGERY ON YOUR SHOULDER, ARM OR HAND  Wear your sling or brace to support entire arm or wrist  You may experience the following sensations on the same side of the body that the block was placed. This will slowly disappear as the block wears off.  Droopy eye or  stuffy nose or pinpoint pupil  Feeling that you cannot take a deep breath  Hoarseness or a weak voice    IF YOU HAD SURGERY ON YOUR LEG OR FOOT  Wear your leg brace as directed by your surgeon. Secure the brace before you get up  Don't get up alone! Ask for help when getting up and moving around to avoid falling.    REMEMBER - SAFETY FIRST!!  Do not drive or operate potentially dangerous  machinery for 24 hours after surgery  Protect your blocked body part from pressure, heat, and cold  If you notice any of these symptoms, immediately call your anesthesiologist.  Rash or hives  Numbness around the mouth  Metallic taste, ringing in the ears  Lightheadedness  Nervousness or  confusion  Twitching, seizures, or tremor  CALL US  IF YOU HAVE ANY QUESTIONS:  [facility] [phone]

## 2024-01-07 NOTE — H&P (Signed)
 PRE-OP HISTORY & PHYSICAL  Foot & Ankle Surgery    Date Time: 01/07/24 10:28 AM  Patient Name: George Valenzuela  Attending Addendum/Attestation:    I have personally seen and examined this patient myself and have participated in their care directly. I agree with the clinical information, including the physical exam, patient history, and planning as documented in the above note by the Resident, Dr. Rumalda.   Additionally, I have edited this note to reflect my findings and plan as well as to incorporate any new data.    Stefani Ruse, DPM, AACFAS  [facility]    History of Present Illness:   George Valenzuela is a 58 y.o. male who presents to the hospital on 01/07/2024 with pain in his right ankle. Here for scheduled surgery. Confirms NPO status. Denies nausea, vomiting, fever, chills, shortness of breath, calf pain, or other abnormalities.    Past Medical History:   Medical History[1]    Past Surgical History:   Past Surgical History[2]    Family History:   Family History[3]    Social History:   Social History[4]    Allergies:   Allergies[5]    Medications:   Prescriptions Prior to Admission[6]    Review of Systems:   A comprehensive review of systems was negative, except for that stated in HPI.    Physical Exam:     Vitals:    01/07/24 0904   BP: 133/76   Pulse: 77   Resp: 16   Temp: 98 F (36.7 C)   SpO2: 97%       Body mass index is 35.58 kg/m.    General: awake, alert, oriented x3; NAD  Heart: regular rate and rhythm  Lungs: Unlabored effort  Abdomen: soft, non-tender, non-distended  Neuro: cranial nerves grossly intact, MAE x4    Lower Extremity Exam:  Palpable DP/PT pulses  Neuro intact  No open lesions      Radiology/Labs:   Radiology and labs reviewed.  Anesthesia and nursing records reviewed.    Assessment:   George Valenzuela is a 58 y.o. male with pain in his right foot. Here for scheduled surgery.    Plan:   - Patient stable, proceed with planned surgery:  Procedure(s) (LRB):  REPAIR, ANKLE  LIGAMENT (Right)  ARTHROTOMY, ANKLE, WITH SYNOVECTOMY (Right)  REMOVAL, HARDWARE, INTERMEDIATE, NON-SPINE (Right)  EXCISION, OSTEOPHYTES (Right)  - Prophylactic antibiotics: 2 grams of Ancef  preoperatively  - All alternatives, risks, and complications discussed with patient and all question/concerns addressed. Patient exhibited appropriate understanding of all discussion points. Informed consent obtained with no guarantees to surgical outcome given or implied.  - H&P on file reviewed by me, no changes.      Cordella Rumalda, DPM PGY-3  Foot and Ankle Surgery  [phone] (Podiatry on-call pager)  [phone] (Personal Pager)             [1]   Past Medical History:  Diagnosis Date    Gastroesophageal reflux disease     controlled with meds    Pinched nerve 2 weeks ago    Sleep apnea     not using CPAP   [2] History reviewed. No pertinent surgical history.  [3] No family history on file.  [4]   Social History  Socioeconomic History    Marital status: Single   Tobacco Use    Smoking status: Some Days     Current packs/day: 0.50     Types: Cigarettes    Smokeless  tobacco: Never   Vaping Use    Vaping status: Never Used   Substance and Sexual Activity    Alcohol use: Not Currently     Alcohol/week: 14.0 standard drinks of alcohol     Types: 14 Cans of beer per week    Drug use: Yes     Types: Marijuana     Comment: daily   [5]   Allergies  Allergen Reactions    Valium [Diazepam] Rash   [6]   Medications Prior to Admission   Medication Sig Dispense Refill Last Dose/Taking    atorvastatin (LIPITOR) 20 MG tablet TAKE 1 TABLET BY MOUTH EVERYDAY AT BEDTIME   01/06/2024 Bedtime    gabapentin (NEURONTIN) 300 MG capsule TAKE ONE CAPSULE BY MOUTH UP TO 3 TIMES A DAY FOR PAIN   01/06/2024 Bedtime    naproxen (NAPROSYN) 500 MG tablet Take 1 tablet (500 mg) by mouth 2 (two) times daily with meals   Past Month    tadalafil (CIALIS) 5 MG tablet TAKE 1 TABLET BY MOUTH EVERY DAY   Unknown

## 2024-01-07 NOTE — Anesthesia Procedure Notes (Signed)
 Peripheral Nerve Block    Patient location during procedure: Pre-Op  Reason for block: Post-op pain management      Injection technique: Single Shot  Block Region: Popliteal Sciatic  Laterality: Right      Block at surgeon's request: Yes          Staffing  Anesthesiologist: Reginia Naas, MD PhD  Performed: Anesthesiologist     Pre-procedure Checklist   Completed: patient identified, surgical consent, pre-op evaluation, timeout performed, risks and benefits discussed, anesthesia consent given and correct site        Peripheral Block  Patient monitoring: Pulse oximetry, NIBP, EKG and Nasal cannula O2  Patient position: SupinePremedication: Yes and Meaningful contact maintained  Local infiltration: Lidocaine 1%  Sterile technique: Chloraprep, Sterile gloves and Mask      Needle  Needle type: Stim needle   Needle gauge: 20 G  Needle length: 10 cm      Guidance: ultrasound; image taken and retained in medical record  Ultrasound Guided: LA spread visualized, Needle visualized, Relevant anatomy identified (nerve, vessels, muscle) and Image stored or printed          Assessment   Incremental injection: yes  Injection made incrementally with aspirations every 5 mL.  Injection Resistance: noParesthesia Pain: No    Blood Aspirated: No  no suspected intravascular injection  Patient tolerated procedure well: Yes  Block Outcome: No complications and Successful block

## 2024-01-07 NOTE — Brief Op Note (Signed)
 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]  Mass of joint of ankle, right [M25.871]  Pain in prosthetic joint, initial encounter [T84.84XA]    Operative Procedure:   Procedure(s):  REPAIR, ANKLE LIGAMENT  ARTHROTOMY, ANKLE, WITH SYNOVECTOMY AND RETROGRADE DRILLING OF TALUS  EXCISION, OSTEOPHYTES    Anesthesia:   Anesthesia General  Gayle Rome FALCON, MD PhD  Anesthesiologist: Gayle Rome FALCON, MD PhD  CRNA: Calla Craven, CRNA    Antibiotics:   2 grams of Ancef  preoperatively    Hemostasis:   Anatomic dissection, mechanical compression, electrocautery  Tourniquet: Thigh-300 mmHg    Total Tourniquet Time Documented:  Leg (Right) - 76 minutes  Total: Leg (Right) - 76 minutes    Estimated Blood Loss:   <5 cc    Materials:   Vicryl and Prolene  Drains: None    Implants:     Implant Name Type Inv. Item Serial No. Manufacturer Lot No. LRB No. Used Action   ANCHOR L26.2 MM .9 MM 2 LOAD TAPER POINT NEEDLE SUTURE BLUE WHITE - [id number] Anchor ANCHOR L26.2 MM .9 MM 2 LOAD TAPER POINT NEEDLE SUTURE BLUE WHITE  [employer] [id number] 1 Implanted       Injectables:       Specimens:   None    Findings:   See dictation for full details.    Complications:   None.    Cordella Rumalda, DPM PGY-3  Foot and Ankle Surgery  [phone] (Podiatry on-call pager)  [phone] (Personal Pager)

## 2024-01-07 NOTE — Op Note (Signed)
 Foot & Ankle Surgery Full Operative Note  Spectra  [phone] / Pager [phone]      PATIENT NAME:  George Valenzuela   58 y.o. 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   Anesthesiologist: Gayle Rome FALCON, MD PhD  CRNA: Calla Craven, CRNA       ESTIMATED BLOOD LOSS:  20 cc     HEMOSTASIS:  - Anatomical dissection, mechanical compression, electrocautery    A well padded thigh tourniquet inflated to 300 mmHg    Total Tourniquet Time Documented:  Leg (Right) - 76 minutes  Total: Leg (Right) - 76 minutes       IMPLANTS:  Implant Name Type Inv. Item Serial No. Model No. Manufacturer Lot No. LRB No. Used Action   ANCHOR L26.2 MM .9 MM 2 LOAD TAPER POINT NEEDLE SUTURE BLUE WHITE - [id number] Anchor ANCHOR L26.2 MM .9 MM 2 LOAD TAPER POINT NEEDLE SUTURE BLUE WHITE  [id number] [employer] [id number] 1 Implanted          MATERIALS:   Vicryl, Monocryl, Prolene, and Skin staples     SPECIMENS:  None    ANTIBIOTICS:  2 grams of Ancef  preoperatively     DRAINS:  None     COMPLICATIONS:  Patient tolerated the procedure well without complication.      INDICATIONS FOR PROCEDURE:  George Valenzuela presents with a chief complaint of right ankle pain. The patient has failed conservative treatments of various modalities. After discussing all  conservative and surgical options, the patient has elected to proceed with surgical intervention.    All alternatives, risks, and complications of the procedures were thoroughly explained to the patient. Patient exhibits appropriate understanding of all discussion points and informed consent was signed and obtained in the chart with no guarantees to surgical outcome given or implied.      DESCRIPTION OF PROCEDURE:  Patient was brought to the operating room and placed on the operative table in the supine position. Patient was secured to the table with safety belt, and a contralateral SCD was placed.  A surgical timeout was performed and all members of the operating room, the procedure, and surgical site were identified. General anesthesia was induced.     A well padded pneumatic tourniquet was placed to the right thigh.     The right lower extremity was then prepped and draped in the usual sterile manner. Following exsanguination with the esmarch, the pneumatic tourniquet was inflated. The following procedure was then undertaken.    PROCEDURE    First, the ankle joint was stressed under fluoroscopy with noted increased clear space in the medial ankle joint indicative of deltoid instability.  A 15 blade was used to make an incision at the anterolateral and anteromedial portal of the ankle joint.  Both incisions were approximately 3 cm in length.  Soft tissue dissection was carried down to the level of joint capsule.  Care was taken to protect neurovascular structures and surrounding tendons.  The ankle joint capsule was then opened through either incision.  Bony osteophytes along the talar head, neck and distal tibia were easily identifiable.  These were excised in toto.  An approximately 0.3 x 0.3 cm osteochondral defect was noted in the medial talar dome.  A K wire was used to perform retrograde drilling to the lesion.  A similar lesion on the lateral talar dome also measuring approximately 0.3 x 0.3 cm was noted  and likewise received retrograde drilling with a K wire.  Following excision of osteophytes around the anterior ankle joint, the joint was put through range of motion without bony impingement and improved motion noted to the ankle joint clinically on the operating room table.  Synovitic tissue within the ankle joint itself was also easily identified and sharply excised.  Both surgical sites were then copiously irrigated.  Attention was then directed to the medial ankle where an approximately 3 cm incision was made along the distal medial malleolus in line with the deltoid ligament.  Soft tissue dissection was carried down to the level of the ligament.  Care was taken to protect neurovascular structures and identify any adjacent tendons.  Bleeding vessels were identified and cauterized necessary.  The deltoid ligament itself was noted to be very enlarged and grossly scarred.  Bony osteophytes likely secondary to previous avulsion fracture were noted within the scarred deltoid ligament itself and more excised in toto.  The deltoid ligament was then debulked of scar tissue sharply.  The previously mentioned fiber tack anchor was then inserted until the distal aspect of the medial malleolus.  The FiberWire was then passed distally, tied down and then passed again approximately and again tied down for reconstruction of the deltoid ligament.  The ankle joint was then stressed under fluoroscopy with noted improvement in medial ankle stability without widening of the medial ankle joint.  Surgical sites were then copiously irrigated and closed in layers using Vicryl and Prolene suture.    The surgical site was then dressed with betadine, adaptic, 4x4 gauze, kerlix, cast padding, ACE wrap, and splint was applied. The tourniquet was deflated with immediate vascular return noted to digits of the operative foot. The patient tolerated both the procedure and anesthesia well with vital signs stable throughout. The patient was  transferred from the OR to recovery under the discretion of anesthesia.    CONDITION:    Patient was taken to PACU in good condition and all vital signs stable.    DISCHARGE:    The patient will be discharged with all postoperative instructions and prescriptions when cleared by anesthesia and will follow up in the office of the surgeon.       Cordella Ee, DPM PGY3  Resident - Foot and Ankle Surgery   [facility]   [phone] (Podiatry On Call Pager)

## 2024-01-07 NOTE — Anesthesia Procedure Notes (Signed)
 Peripheral Nerve Block    Patient location during procedure: Pre-Op  Reason for block: Post-op pain management      Injection technique: Single Shot  Block Region: Adductor Canal/Mid-Thigh Femoral  Laterality: Right      Block at surgeon's request: Yes          Staffing  Anesthesiologist: Reginia Naas, MD PhD  Performed: Anesthesiologist     Pre-procedure Checklist   Completed: patient identified, surgical consent, pre-op evaluation, timeout performed, risks and benefits discussed, anesthesia consent given and correct site        Peripheral Block  Patient monitoring: Pulse oximetry, NIBP, EKG and Nasal cannula O2  Patient position: SupinePremedication: Yes and Meaningful contact maintained  Local infiltration: Lidocaine 1%  Sterile technique: Chloraprep, Sterile gloves and Mask      Needle  Needle type: Stim needle   Needle gauge: 20 G  Needle length: 10 cm      Guidance: ultrasound; image taken and retained in medical record  Ultrasound Guided: LA spread visualized, Needle visualized, Relevant anatomy identified (nerve, vessels, muscle) and Image stored or printed          Assessment   Incremental injection: yes  Injection made incrementally with aspirations every 5 mL.  Injection Resistance: noParesthesia Pain: No    Blood Aspirated: No  no suspected intravascular injection  Patient tolerated procedure well: Yes  Block Outcome: No complications and Successful block

## 2024-01-07 NOTE — OR PreOp (Signed)
 Assisted anesthesiologist Dr Gayle and RN Estefana with placing right adductor canal and popliteal sciatic single shot PNB in preop/pacu. Patient was medicated with IV sedation, see MAR for details. VVS, bedside monitor on. Sterile protocol maintained. Patient tolerated procedure well. No s/s LAST observed.  Discussed expected outcomes, (pain control,decreased opioid requirements and side effects, limb safety) prior to preforming nerve block and patient verbalized understanding.

## 2024-01-08 ENCOUNTER — Encounter: Payer: Self-pay | Admitting: Podiatrist

## 2024-01-08 NOTE — Clinical Note (Incomplete)
 Attention was directed to the medial aspect of the {[id number]} foot where a curvilinear incision was made through skin. This was oriented proximally over the first medial cuneiform and extended distally bisecting the extensor hallucis longus tendon and medial eminence of the first metatarsal. The incision was deepened through subcutaneous tissues utilizing sharp and blunt dissection, care was taken to protect all neurovascular structures. All bleeders were cauterized as necessary. A capsular incision was then made over the first MPJ down to bone. The periosteum was elevated and the capsular tissues were retracted. There was noted to be a prominent dorsal medial metatarsal head. A saw was then used to sharply resect the prominence. Next, blunt dissection ensued lateral to the metatarsal head in the first interspace until the conjoint tendon of the adductor hallucis was identified on the lateral plantar aspect of the base of the proximal phalanx. This was transversally resected from its insertion. A lateral capsulotomy was also performed at the level of the first MPJ and transection of the lateral sesamoidal ligament. The HAV deformity was reduced and the hallux was noted to be in a more rectus position. Attention was then directed proximally within the same incision at the first metatarsal-cuneiform joint. The EHL tendon was retracted laterally. Capsular and periosteal tissues were reflected over the 1st tarsometatarsal joint. With adequate joint exposure, a sagittal saw was then used parallel to the joint surface to resect the articular cartilage on the base of the first metatarsal.  The saw was then used to take a lateral wedge from the distal surface of the medial cuneiform. The base of the wedge was oriented laterally to achieve reduction of the intermetatarsal angle.  The remaining bony and cartilaginous fragments from the joint were resected and passed off to the back table.  The site was irrigated with  copious amounts sterile normal saline.  Each side of the arthrodesis site was subchondrally drilled. Temporary fixation with a K-wire for the cannulated screw was placed across the arthrodesis site, maintaining reduction. Fluoroscopy imaging confirmed reduction. A *** 4.0-mm screw was placed across the arthrodesis site from distal to proximal and dorsal to plantar. The screw was inserted under strict AO technique.  To aid in further stability of the construct, a *** locking plate was placed over the joint and secured with the previously descirbed screws. Excellent apposition was noted across the arthrodesis site and fluoroscopy confirmed hardware position as well as reduction of the IM angle. All bony prominences at the joint were smoothed. The area was then flushed with copious amounts of sterile saline.  The capsule and subcutaneous tissues were then closed with vicryl suture. The skin was then closed with a running subcuticular stitch under minimal tension.

## 2024-01-11 NOTE — Anesthesia Postprocedure Evaluation (Signed)
 Anesthesia Post Evaluation    Patient: George Valenzuela    REPAIR, ANKLE LIGAMENT (Right)  ARTHROTOMY, ANKLE, WITH SYNOVECTOMY AND RETROGRADE DRILLING OF TALUS (Right)  EXCISION, OSTEOPHYTES (Right)    Anesthesia type: general    Last Vitals:   Vitals Value Taken Time   BP 144/90 01/07/24 14:00   Temp 36.9 C (98.4 F) 01/07/24 13:00   Pulse 76 01/07/24 14:00   Resp 15 01/07/24 14:00   SpO2 95 % 01/07/24 14:00                 Anesthesia Post Evaluation:     Patient Evaluated: PACU  Patient Participation: complete - patient participated  Level of Consciousness: awake and alert    Pain Management: adequate  Multimodal analgesia pain management approach  Strategies: acetaminophen  and regional block  Airway Patency: patent  Two or more mitigation strategies used for obstructive sleep apnea.  Strategies: multimodal analgesia and extubation/recovery carried out in nonsupine position    Anesthetic complications: No      PONV Status: none    Cardiovascular status: acceptable  Respiratory status: acceptable, room air and spontaneous ventilation  Hydration status: acceptable          Signed by: Rome JULIANNA Santos, MD PhD, 01/11/2024 3:27 PM
